# Patient Record
Sex: Male | Born: 1945
Health system: Southern US, Community
[De-identification: ages and names within clinical notes are randomized; demographics above are authoritative.]

## PROBLEM LIST (undated history)

## (undated) ENCOUNTER — Emergency Department (HOSPITAL_COMMUNITY): Discharge status: Against Medical Advice | Payer: Medicare Other

## (undated) DIAGNOSIS — Z9221 Personal history of antineoplastic chemotherapy: Secondary | ICD-10-CM

## (undated) DIAGNOSIS — C801 Malignant (primary) neoplasm, unspecified: Secondary | ICD-10-CM

## (undated) DIAGNOSIS — C679 Malignant neoplasm of bladder, unspecified: Secondary | ICD-10-CM

## (undated) DIAGNOSIS — H9190 Unspecified hearing loss, unspecified ear: Secondary | ICD-10-CM

## (undated) DIAGNOSIS — R634 Abnormal weight loss: Secondary | ICD-10-CM

## (undated) DIAGNOSIS — R5383 Other fatigue: Secondary | ICD-10-CM

## (undated) DIAGNOSIS — C189 Malignant neoplasm of colon, unspecified: Secondary | ICD-10-CM

## (undated) DIAGNOSIS — J449 Chronic obstructive pulmonary disease, unspecified: Secondary | ICD-10-CM

## (undated) DIAGNOSIS — Z85038 Personal history of other malignant neoplasm of large intestine: Secondary | ICD-10-CM

## (undated) DIAGNOSIS — N400 Enlarged prostate without lower urinary tract symptoms: Secondary | ICD-10-CM

## (undated) DIAGNOSIS — N200 Calculus of kidney: Secondary | ICD-10-CM

## (undated) DIAGNOSIS — C8591 Non-Hodgkin lymphoma, unspecified, lymph nodes of head, face, and neck: Principal | ICD-10-CM

## (undated) HISTORY — DX: Other fatigue: R53.83

## (undated) HISTORY — DX: Malignant neoplasm of bladder, unspecified: C67.9

## (undated) HISTORY — DX: Personal history of antineoplastic chemotherapy: Z92.21

## (undated) HISTORY — DX: Benign prostatic hyperplasia without lower urinary tract symptoms: N40.0

## (undated) HISTORY — DX: Malignant (primary) neoplasm, unspecified: C80.1

## (undated) HISTORY — PX: HAND TENDON SURGERY: SHX663

## (undated) HISTORY — DX: Non-Hodgkin lymphoma, unspecified, lymph nodes of head, face, and neck: C85.91

## (undated) HISTORY — DX: Malignant neoplasm of colon, unspecified: C18.9

## (undated) HISTORY — DX: Abnormal weight loss: R63.4

## (undated) HISTORY — DX: Personal history of other malignant neoplasm of large intestine: Z85.038

## (undated) HISTORY — DX: Calculus of kidney: N20.0

---

## 1981-04-09 HISTORY — PX: NASAL POLYP SURGERY: SHX186

## 2001-09-18 ENCOUNTER — Ambulatory Visit (HOSPITAL_COMMUNITY): Admission: RE | Admit: 2001-09-18 | Discharge: 2001-09-18 | Payer: Self-pay | Admitting: Orthopaedic Surgery

## 2003-08-04 ENCOUNTER — Ambulatory Visit (HOSPITAL_COMMUNITY): Admission: RE | Admit: 2003-08-04 | Discharge: 2003-08-04 | Payer: Self-pay | Admitting: *Deleted

## 2003-08-04 ENCOUNTER — Ambulatory Visit (HOSPITAL_BASED_OUTPATIENT_CLINIC_OR_DEPARTMENT_OTHER): Admission: RE | Admit: 2003-08-04 | Discharge: 2003-08-04 | Payer: Self-pay | Admitting: *Deleted

## 2008-04-09 DIAGNOSIS — C189 Malignant neoplasm of colon, unspecified: Secondary | ICD-10-CM

## 2008-04-09 DIAGNOSIS — C679 Malignant neoplasm of bladder, unspecified: Secondary | ICD-10-CM

## 2008-04-09 HISTORY — DX: Malignant neoplasm of colon, unspecified: C18.9

## 2008-04-09 HISTORY — DX: Malignant neoplasm of bladder, unspecified: C67.9

## 2008-04-09 HISTORY — PX: COLECTOMY: SHX59

## 2008-04-09 HISTORY — PX: BLADDER SURGERY: SHX569

## 2010-04-09 HISTORY — PX: COLONOSCOPY: SHX174

## 2012-03-26 ENCOUNTER — Other Ambulatory Visit: Payer: Self-pay | Admitting: Otolaryngology

## 2012-03-26 DIAGNOSIS — C801 Malignant (primary) neoplasm, unspecified: Secondary | ICD-10-CM

## 2012-03-26 HISTORY — DX: Malignant (primary) neoplasm, unspecified: C80.1

## 2012-03-26 HISTORY — PX: OTHER SURGICAL HISTORY: SHX169

## 2012-04-01 ENCOUNTER — Telehealth: Payer: Self-pay | Admitting: Oncology

## 2012-04-01 NOTE — Telephone Encounter (Signed)
LVOM for pt to return call.  °

## 2012-04-01 NOTE — Telephone Encounter (Signed)
S/W pt dtr in re NP appt 12/30 @ 2:45 w/Dr. Gaylyn Rong.  Referring Dr. Christia Reading Dx-NHL/LT TONSIL LYMPHOMA Welcome packet mailed.

## 2012-04-01 NOTE — Telephone Encounter (Signed)
C/D 04/01/12 for appt.04/07/12  °

## 2012-04-04 ENCOUNTER — Encounter: Payer: Self-pay | Admitting: Oncology

## 2012-04-04 DIAGNOSIS — C8591 Non-Hodgkin lymphoma, unspecified, lymph nodes of head, face, and neck: Secondary | ICD-10-CM | POA: Insufficient documentation

## 2012-04-04 DIAGNOSIS — R5383 Other fatigue: Secondary | ICD-10-CM | POA: Insufficient documentation

## 2012-04-06 NOTE — Patient Instructions (Addendum)
1.  Diagnosis:  High grade follicular lymphoma.  - Cause:  Unknown.   Lymphoma is not commonly known to be hereditary in most cases.  - Stage:  To be determined with further work up:  PET scan, bone marrow biopsy. - Prognosis:  To be determined with further work up. - Treatment:  Lymphoma is often not cured by surgery since it is often a systemic disease. Radiation if normally reserved for low grade, localized indolent lymphoma as upfront treatment or as consolidative therapy for bulky disease after chemotherapy.  - Chemotherapy:  R-CHOP chemo is given once every 3 weeks; with day #2 injection of Neulasta to decrease risk of infection.  This regimen contains:  Rituxan, Cytoxan, Adriamycin, Vincristine, Prednisone.  The first 4 agents are given intravenously on day 1 here at the [facility].  Prednisone pill is taken at home, in the morning, on days 1-5 (ONLY) of each chemo cycle.   - Potential side effects of R-CHOP which include but not limited to infusion reaction, rare seizure disorder, reactivated hepatitis, fatigue, low blood count, hair loss, mouth sore, infection, bleeding, irritated bladder lining with blood in urine, constipation, numbness and tingling of fingers and toes, secondary cancer, congestive heart failure.   - I also recommend intrathecal chemotherapy given slightly elevated risk of future intracranial lymphoma in patients with tonsil lymphoma.   - To assess response to chemo, a repeat scan will be obtained after a few cycles of chemo.   - In preparation for therapy:  * PET scan to complete staging work up.   * Bone marrow biopsy to rule out lymphoma in bone marrow space.   * Referral to 2-D echocardiogram to assess baseline heart function.   * Referral to Interventional Radiology for Portacath placement.   * Referral to chemo class to learn about practical tips while on chemo.   * Pick up nausea medications from your preferred pharmacy.

## 2012-04-07 ENCOUNTER — Other Ambulatory Visit (HOSPITAL_BASED_OUTPATIENT_CLINIC_OR_DEPARTMENT_OTHER): Payer: Medicare Other | Admitting: Lab

## 2012-04-07 ENCOUNTER — Ambulatory Visit (HOSPITAL_BASED_OUTPATIENT_CLINIC_OR_DEPARTMENT_OTHER): Payer: Medicare Other | Admitting: Oncology

## 2012-04-07 ENCOUNTER — Encounter: Payer: Self-pay | Admitting: Oncology

## 2012-04-07 ENCOUNTER — Telehealth: Payer: Self-pay | Admitting: Oncology

## 2012-04-07 VITALS — BP 142/90 | HR 76 | Temp 96.6°F | Resp 20 | Ht 71.0 in | Wt 152.1 lb

## 2012-04-07 DIAGNOSIS — C8591 Non-Hodgkin lymphoma, unspecified, lymph nodes of head, face, and neck: Secondary | ICD-10-CM

## 2012-04-07 DIAGNOSIS — Z85038 Personal history of other malignant neoplasm of large intestine: Secondary | ICD-10-CM

## 2012-04-07 DIAGNOSIS — R5383 Other fatigue: Secondary | ICD-10-CM

## 2012-04-07 DIAGNOSIS — Z8551 Personal history of malignant neoplasm of bladder: Secondary | ICD-10-CM

## 2012-04-07 DIAGNOSIS — C8291 Follicular lymphoma, unspecified, lymph nodes of head, face, and neck: Secondary | ICD-10-CM

## 2012-04-07 LAB — COMPREHENSIVE METABOLIC PANEL (CC13)
AST: 13 U/L (ref 5–34)
Albumin: 3.9 g/dL (ref 3.5–5.0)
Alkaline Phosphatase: 64 U/L (ref 40–150)
BUN: 11 mg/dL (ref 7.0–26.0)
Calcium: 9.3 mg/dL (ref 8.4–10.4)
Chloride: 105 mEq/L (ref 98–107)
Glucose: 86 mg/dl (ref 70–99)
Potassium: 4.3 mEq/L (ref 3.5–5.1)
Sodium: 141 mEq/L (ref 136–145)
Total Protein: 7.2 g/dL (ref 6.4–8.3)

## 2012-04-07 LAB — CBC WITH DIFFERENTIAL/PLATELET
BASO%: 0.5 % (ref 0.0–2.0)
Basophils Absolute: 0 10*3/uL (ref 0.0–0.1)
Eosinophils Absolute: 0.1 10*3/uL (ref 0.0–0.5)
HCT: 43.9 % (ref 38.4–49.9)
HGB: 15.6 g/dL (ref 13.0–17.1)
LYMPH%: 20.9 % (ref 14.0–49.0)
MCHC: 35.6 g/dL (ref 32.0–36.0)
MONO#: 0.4 10*3/uL (ref 0.1–0.9)
NEUT%: 71.5 % (ref 39.0–75.0)
Platelets: 270 10*3/uL (ref 140–400)
WBC: 7 10*3/uL (ref 4.0–10.3)

## 2012-04-07 LAB — URIC ACID (CC13): Uric Acid, Serum: 4.2 mg/dl (ref 2.6–7.4)

## 2012-04-07 MED ORDER — PREDNISONE 20 MG PO TABS: 40.0000 mg/m2 | ORAL_TABLET | Freq: Every day | ORAL | Status: DC

## 2012-04-07 MED ORDER — ONDANSETRON HCL 8 MG PO TABS: 8.0000 mg | ORAL_TABLET | Freq: Two times a day (BID) | ORAL | Status: DC

## 2012-04-07 MED ORDER — PROCHLORPERAZINE MALEATE 10 MG PO TABS: 10.0000 mg | ORAL_TABLET | Freq: Four times a day (QID) | ORAL | Status: DC | PRN

## 2012-04-07 MED ORDER — LIDOCAINE-PRILOCAINE 2.5-2.5 % EX CREA: TOPICAL_CREAM | CUTANEOUS | Status: DC | PRN

## 2012-04-07 NOTE — Progress Notes (Signed)
PCP checked.  Saw left tonsil white patch.     Decreased appetite.  Nausea.  Not able to eat well x a few weeks.  Only 2 lb lost. Dry heaves; worse in am. No Abd pain     [facility]  Telephone:(336) [phone] [phone]     INITIAL HEMATOLOGY CONSULTATION    Referral MD:  Kenneth Reed, M.D.  Reason for Referral: newly diagnosed high grade follicular lymphoma of right tonsil.   HPI: [name]. Kenneth Reed is a 66 year-old man with history of colon and bladder cancer and was cared for up in [facility].  I am requesting record from those episodes for continuity of care.  He was in [facility] until about a month ago when his PCP found a large left tonsil mass.  He was referred to Kenneth Reed who performed biopsy on 03/26/12 with pathology care # [id number] consistent with high grade non hodgkin B-cell lymphoma (CD20+; CD10+, BCL-6 positive; Ki-67 was 75%.  He was kindly referred to the [facility] for evaluation.    Kenneth Reed presented to the clinic today for the first time with his brother and sister.  He has not noticed any dysphagia, odynophagia, palpable nodes anywhere.  She does have decreased appetite and about less than 5-lb weight loss.  He has dry heaves in the morning but no frank vomiting.  He denied abdominal pain.  He does have slight fatigue; however, he is independent of all activities of daily living.   Patient denies fever, headache, visual changes, confusion, drenching night sweats, palpable lymph node swelling, mucositis, odynophagia, dysphagia, jaundice, chest pain, palpitation, shortness of breath, dyspnea on exertion, productive cough, gum bleeding, epistaxis, hematemesis, hemoptysis, abdominal pain, abdominal swelling, early satiety, melena, hematochezia, hematuria, skin rash, spontaneous bleeding, joint swelling, joint pain, heat or cold intolerance, bowel bladder incontinence, back pain, focal motor weakness, paresthesia, depression.       Past Medical History  Diagnosis Date  . Non-Hodgkin's lymphoma of head   . Fatigue   . Colon cancer 2010    Dr. Gabriel Reed operated; stage II.  No need for chemo.  . Bladder cancer 2010    Dr. Rito Reed operated; no need for chemo; recurred locally  in 2013 locally.    Marland Kitchen BPH (benign prostatic hyperplasia)   :    Past Surgical History  Procedure Date  . Nasal polyp surgery 1983  . Colectomy 2010  . Bladder surgery 2010  . Colonoscopy 2012    next one 2014.   :   CURRENT MEDS: Current Outpatient Prescriptions  Medication Sig Dispense Refill  . finasteride (PROSCAR) 5 MG tablet Take 5 mg by mouth daily.      . Fluticasone-Salmeterol (ADVAIR) 250-50 MCG/DOSE AEPB Inhale 1 puff into the lungs every 12 (twelve) hours.      Marland Kitchen omeprazole (PRILOSEC) 20 MG capsule Take 20 mg by mouth daily as needed.      . lidocaine-prilocaine (EMLA) cream Apply topically as needed. Apply to porta cath site one hour prior to needle stick  30 g  2  . ondansetron (ZOFRAN) 8 MG tablet Take 1 tablet (8 mg total) by mouth 2 (two) times daily. Take two times a day starting the day after chemo for 3 days. Then take two times a day as needed for nausea or vomiting.  30 tablet  1  . predniSONE (DELTASONE) 20 MG tablet Take 3.5 tablets (70 mg total) by mouth daily. Take on days 1-5 of  chemotherapy.  100 tablet  0  . prochlorperazine (COMPAZINE) 10 MG tablet Take 1 tablet (10 mg total) by mouth every 6 (six) hours as needed (Nausea or vomiting).  30 tablet  6      No Known Allergies:  Family History  Problem Relation Age of Onset  . Cancer Father     leukemia  . Cancer Maternal Aunt     leukemia  . Cancer Paternal Grandmother     leukemia  :  History   Social History  . Marital Status: Single    Spouse Name: N/A    Number of Children: 2  . Years of Education: N/A   Occupational History  .  [employer].    Social History Main Topics  . Smoking status: Never Smoker   .  Smokeless tobacco: Current User  . Alcohol Use: Yes  . Drug Use: No  . Sexually Active:    Other Topics Concern  . Not on file   Social History Narrative  . No narrative on file  :  REVIEW OF SYSTEM:  The rest of the 14-point review of sytem was negative.   Exam: ECOG 0-1.   General:  Thin-appearing man, in no acute distress.  Eyes:  no scleral icterus.  ENT:  There was a large fungating left tonsil mass about 3x2cm.  Neck was without thyromegaly.  Lymphatics:  Negative cervical, supraclavicular or axillary adenopathy.  Respiratory: lungs were clear bilaterally without wheezing or crackles.  Cardiovascular:  Regular rate and rhythm, S1/S2, without murmur, rub or gallop.  There was no pedal edema.  GI:  abdomen was soft, flat, nontender, nondistended, without organomegaly.  Muscoloskeletal:  no spinal tenderness of palpation of vertebral spine.  Skin exam was without echymosis, petichae.  Neuro exam was nonfocal.  Patient was able to get on and off exam table without assistance.  Gait was normal.  Patient was alerted and oriented.  Attention was good.   Language was appropriate.  Mood was normal without depression.  Speech was not pressured.  Thought content was not tangential.    LABS:  Lab Results  Component Value Date   WBC 6.0 04/08/2012   HGB 16.0 04/08/2012   HCT 44.3 04/08/2012   PLT 269 04/08/2012   GLUCOSE 86 04/07/2012   ALT 9 04/07/2012   AST 13 04/07/2012   NA 141 04/07/2012   K 4.3 04/07/2012   CL 105 04/07/2012   CREATININE 1.0 04/07/2012   BUN 11.0 04/07/2012   CO2 28 04/07/2012      ASSESSMENT AND PLAN:   1.  History of stage II colon cancer; s/p colectomy in 2010. He is supposedly in remission.  His last colonoscopy in 2012 was reportedly negative but is due for one more in 2014.  I am awaiting record from Dr. Gabriel Reed for confirmation.  2.  History of bladder cancer in 2010; with local recurrence in 2013.  I am awaiting record from Dr. Rito Reed for  confirmation.   3.  High grade follicular lymphoma.  - Cause:  Unknown.   Lymphoma is not commonly known to be hereditary in most cases.  - Stage:  To be determined with further work up:  PET scan, bone marrow biopsy. - Prognosis:  To be determined with further work up. - Treatment:  Lymphoma is often not cured by surgery since it is often a systemic disease. Radiation if normally reserved for low grade, localized indolent lymphoma as upfront treatment or as  consolidative therapy for bulky disease after chemotherapy.  - Chemotherapy:  R-CHOP chemo is given once every 3 weeks; with day #2 injection of Neulasta to decrease risk of infection.  This regimen contains:  Rituxan, Cytoxan, Adriamycin, Vincristine, Prednisone.  The first 4 agents are given intravenously on day 1 here at the [facility].  Prednisone pill is taken at home, in the morning, on days 1-5 (ONLY) of each chemo cycle.   - Potential side effects of R-CHOP which include but not limited to infusion reaction, rare seizure disorder, reactivated hepatitis, fatigue, low blood count, hair loss, mouth sore, infection, bleeding, irritated bladder lining with blood in urine, constipation, numbness and tingling of fingers and toes, secondary cancer, congestive heart failure.   - I also recommend intrathecal chemotherapy given slightly elevated risk of future intracranial lymphoma in patients with tonsil lymphoma.   - To assess response to chemo, a repeat scan will be obtained after a few cycles of chemo.   - In preparation for therapy:  * PET scan to complete staging work up.   * Bone marrow biopsy to rule out lymphoma in bone marrow space.   * Referral to 2-D echocardiogram to assess baseline heart function.   * Referral to Interventional Radiology for Portacath placement.   * Referral to chemo class to learn about practical tips while on chemo.   * Pick up nausea medications from your preferred pharmacy.   Mr. Patti and his relatives  expressed informed understanding of all these recommendations and agreed to proceed.        The length of time of the face-to-face encounter was 60 minutes. More than 50% of time was spent counseling and coordination of care.     Thank you for this referral.

## 2012-04-07 NOTE — Telephone Encounter (Signed)
Pt given appt for bmbx tomorrow @ 9:45am. Pt aware he is to arrive @ 9:15am. Per HH he can do 9:45am due to 11am not available. S/w Myriam Jacobson ok to add bmbx for 9:45am 12/31 and Cameo will have to help - [facility] aware. Pt aware he can get other appts when he comes in tomorrow.

## 2012-04-08 ENCOUNTER — Encounter: Payer: Self-pay | Admitting: *Deleted

## 2012-04-08 ENCOUNTER — Other Ambulatory Visit: Payer: Self-pay | Admitting: Oncology

## 2012-04-08 ENCOUNTER — Encounter: Payer: Self-pay | Admitting: Oncology

## 2012-04-08 ENCOUNTER — Other Ambulatory Visit (HOSPITAL_COMMUNITY)
Admission: RE | Admit: 2012-04-08 | Discharge: 2012-04-08 | Disposition: A | Discharge status: Home / Self Care | Payer: Medicare Other | Source: Ambulatory Visit | Attending: Oncology | Admitting: Oncology

## 2012-04-08 ENCOUNTER — Telehealth: Payer: Self-pay | Admitting: *Deleted

## 2012-04-08 ENCOUNTER — Ambulatory Visit (HOSPITAL_BASED_OUTPATIENT_CLINIC_OR_DEPARTMENT_OTHER): Payer: Medicare Other | Admitting: Oncology

## 2012-04-08 ENCOUNTER — Ambulatory Visit (HOSPITAL_BASED_OUTPATIENT_CLINIC_OR_DEPARTMENT_OTHER): Payer: Medicare Other | Admitting: Lab

## 2012-04-08 ENCOUNTER — Telehealth: Payer: Self-pay | Admitting: Oncology

## 2012-04-08 VITALS — BP 147/82 | HR 65

## 2012-04-08 DIAGNOSIS — C8591 Non-Hodgkin lymphoma, unspecified, lymph nodes of head, face, and neck: Secondary | ICD-10-CM

## 2012-04-08 DIAGNOSIS — C8291 Follicular lymphoma, unspecified, lymph nodes of head, face, and neck: Secondary | ICD-10-CM

## 2012-04-08 DIAGNOSIS — C8589 Other specified types of non-Hodgkin lymphoma, extranodal and solid organ sites: Secondary | ICD-10-CM | POA: Insufficient documentation

## 2012-04-08 LAB — CBC WITH DIFFERENTIAL/PLATELET
BASO%: 1.1 % (ref 0.0–2.0)
EOS%: 2.6 % (ref 0.0–7.0)
HCT: 44.3 % (ref 38.4–49.9)
MCV: 91.8 fL (ref 79.3–98.0)
NEUT#: 4 10*3/uL (ref 1.5–6.5)
Platelets: 269 10*3/uL (ref 140–400)
RBC: 4.82 10*6/uL (ref 4.20–5.82)

## 2012-04-08 NOTE — Telephone Encounter (Signed)
gv pt's son appt schedule for January thru April including echo appt and port placement appt that was already on schedule. Per son he has the port info but has to call IR back and has the number. Son aware central will contact them re pet scan appt.

## 2012-04-08 NOTE — Progress Notes (Signed)
Post bone marrow procedure.

## 2012-04-08 NOTE — Progress Notes (Signed)
Pt tolerated bone marrow bx well.  Pt instructed to call for any excessive bleeding not stopped by pressure or pain not relieved by tylenol.  Pt instructed to leave dressing on for 24 hours after procedure.  Pt to monitor for bleeding.  Site/dressing clean, dry, and intact at discharge.  Pt verbalized understanding.

## 2012-04-08 NOTE — Progress Notes (Signed)
See bone marrow biopsy procedure note dated 04/08/12. 

## 2012-04-08 NOTE — Patient Instructions (Addendum)
Bone Marrow Aspiration, Bone Marrow Biopsy  Care After  Read the instructions outlined below and refer to this sheet in the next few weeks. These discharge instructions provide you with general information on caring for yourself after you leave the hospital. Your caregiver may also give you specific instructions. While your treatment has been planned according to the most current medical practices available, unavoidable complications occasionally occur. If you have any problems or questions after discharge, call your caregiver.  FINDING OUT THE RESULTS OF YOUR TEST  Not all test results are available during your visit. If your test results are not back during the visit, make an appointment with your caregiver to find out the results. Do not assume everything is normal if you have not heard from your caregiver or the medical facility. It is important for you to follow up on all of your test results.   HOME CARE INSTRUCTIONS   You have had sedation and may be sleepy or dizzy. Your thinking may not be as clear as usual. For the next 24 hours:   Only take over-the-counter or prescription medicines for pain, discomfort, and or fever as directed by your caregiver.   Do not drink alcohol.   Do not smoke.   Do not drive.   Do not make important legal decisions.   Do not operate heavy machinery.   Do not care for small children by yourself.   Keep your dressing clean and dry. You may replace dressing with a bandage after 24 hours.   You may take a bath or shower after 24 hours.   Use an ice pack for 20 minutes every 2 hours while awake for pain as needed.  SEEK MEDICAL CARE IF:    There is redness, swelling, or increasing pain at the biopsy site.   There is pus coming from the biopsy site.   There is drainage from a biopsy site lasting longer than one day.   An unexplained oral temperature above 102 F (38.9 C) develops.  SEEK IMMEDIATE MEDICAL CARE IF:    You develop a rash.   You have difficulty  breathing.   You develop any reaction or side effects to medications given.  Document Released: 10/13/2004 Document Revised: 06/18/2011 Document Reviewed: 03/23/2008  ExitCare Patient Information 2013 [employer].

## 2012-04-08 NOTE — Progress Notes (Signed)
Faxed signed release of information to Dr. Gabriel Cirri office [phone] per Dr. Gaylyn Rong request for Pathology report and Operative notes.

## 2012-04-08 NOTE — Telephone Encounter (Signed)
Per staff phone call and POF I have scheduled appt. JMW  

## 2012-04-08 NOTE — Procedures (Signed)
   [facility]  Telephone:(336) [phone] Fax:(336) [phone]   BONE MARROW BIOPSY AND ASPIRATION   INDICATION:  Staging for non Hodgkin's lymphoma.   Procedure: After obtained from consent, DANNA CASELLA was placed in the prone position. Time out was performed verifying correct patient and procedure. The skin overlying the left posterior crest was prepped with Betadine and draped in the usual sterile fashion. The skin and periosteum were infiltrated with 10  mL of 2% lidocaine. A small puncture wound was made with #11 scalpel blade.  Bone marrow aspirate was obtained on the first pass of the aspiration needle.  One core biopsy was obtained through the same incision.   The aspirate was sent for routine histology, flow cytometry, and cytogenetics.  Core biopsy was sent for routine histology.   Beulah Gandy Cafiero tolerated procedure well with minimal  blood loss and without immediate complication.   A sterile dressing was applied.   Jethro Bolus M.D. 04/08/2012

## 2012-04-09 DIAGNOSIS — C8591 Non-Hodgkin lymphoma, unspecified, lymph nodes of head, face, and neck: Secondary | ICD-10-CM

## 2012-04-09 HISTORY — DX: Non-Hodgkin lymphoma, unspecified, lymph nodes of head, face, and neck: C85.91

## 2012-04-10 ENCOUNTER — Other Ambulatory Visit: Payer: Self-pay | Admitting: Radiology

## 2012-04-11 ENCOUNTER — Encounter (HOSPITAL_COMMUNITY)
Admission: RE | Admit: 2012-04-11 | Discharge: 2012-04-11 | Disposition: A | Discharge status: Home / Self Care | Payer: Medicare Other | Source: Ambulatory Visit | Attending: Oncology | Admitting: Oncology

## 2012-04-11 ENCOUNTER — Telehealth: Payer: Self-pay | Admitting: Oncology

## 2012-04-11 ENCOUNTER — Encounter (HOSPITAL_COMMUNITY): Payer: Self-pay | Admitting: Pharmacy Technician

## 2012-04-11 DIAGNOSIS — R5381 Other malaise: Secondary | ICD-10-CM | POA: Insufficient documentation

## 2012-04-11 DIAGNOSIS — C8581 Other specified types of non-Hodgkin lymphoma, lymph nodes of head, face, and neck: Secondary | ICD-10-CM | POA: Insufficient documentation

## 2012-04-11 DIAGNOSIS — R5383 Other fatigue: Secondary | ICD-10-CM

## 2012-04-11 DIAGNOSIS — C8591 Non-Hodgkin lymphoma, unspecified, lymph nodes of head, face, and neck: Secondary | ICD-10-CM

## 2012-04-11 LAB — GLUCOSE, CAPILLARY: Glucose-Capillary: 93 mg/dL (ref 70–99)

## 2012-04-11 MED ORDER — FLUDEOXYGLUCOSE F - 18 (FDG) INJECTION
17.3000 | Freq: Once | INTRAVENOUS | Status: AC | PRN
  Administered 2012-04-11: 17.3 via INTRAVENOUS

## 2012-04-11 NOTE — Telephone Encounter (Signed)
Pt came in and needed to move appt to another day bc pt had appt for everyday...Kenneth KitchenMarland Reed

## 2012-04-14 ENCOUNTER — Other Ambulatory Visit: Payer: Medicare Other

## 2012-04-15 ENCOUNTER — Encounter (HOSPITAL_COMMUNITY): Payer: Self-pay

## 2012-04-15 ENCOUNTER — Ambulatory Visit (HOSPITAL_COMMUNITY)
Admission: RE | Admit: 2012-04-15 | Discharge: 2012-04-15 | Disposition: A | Discharge status: Home / Self Care | Payer: Medicare Other | Source: Ambulatory Visit | Attending: Oncology | Admitting: Oncology

## 2012-04-15 ENCOUNTER — Other Ambulatory Visit: Payer: Self-pay | Admitting: Oncology

## 2012-04-15 ENCOUNTER — Ambulatory Visit (HOSPITAL_COMMUNITY): Admission: RE | Admit: 2012-04-15 | Payer: Medicare Other | Source: Ambulatory Visit

## 2012-04-15 ENCOUNTER — Other Ambulatory Visit (INDEPENDENT_AMBULATORY_CARE_PROVIDER_SITE_OTHER): Payer: Self-pay | Admitting: Surgery

## 2012-04-15 DIAGNOSIS — C8589 Other specified types of non-Hodgkin lymphoma, extranodal and solid organ sites: Secondary | ICD-10-CM | POA: Insufficient documentation

## 2012-04-15 DIAGNOSIS — R5383 Other fatigue: Secondary | ICD-10-CM

## 2012-04-15 DIAGNOSIS — Z79899 Other long term (current) drug therapy: Secondary | ICD-10-CM | POA: Insufficient documentation

## 2012-04-15 DIAGNOSIS — Z8551 Personal history of malignant neoplasm of bladder: Secondary | ICD-10-CM | POA: Insufficient documentation

## 2012-04-15 DIAGNOSIS — C8591 Non-Hodgkin lymphoma, unspecified, lymph nodes of head, face, and neck: Secondary | ICD-10-CM

## 2012-04-15 DIAGNOSIS — Z85038 Personal history of other malignant neoplasm of large intestine: Secondary | ICD-10-CM | POA: Insufficient documentation

## 2012-04-15 DIAGNOSIS — Z806 Family history of leukemia: Secondary | ICD-10-CM | POA: Insufficient documentation

## 2012-04-15 LAB — CBC WITH DIFFERENTIAL/PLATELET
Basophils Absolute: 0 10*3/uL (ref 0.0–0.1)
Basophils Relative: 1 % (ref 0–1)
Eosinophils Relative: 2 % (ref 0–5)
HCT: 42.4 % (ref 39.0–52.0)
MCH: 32.1 pg (ref 26.0–34.0)
MCHC: 36.1 g/dL — ABNORMAL HIGH (ref 30.0–36.0)
MCV: 88.9 fL (ref 78.0–100.0)
Monocytes Absolute: 0.4 10*3/uL (ref 0.1–1.0)
RBC: 4.77 MIL/uL (ref 4.22–5.81)
RDW: 12.2 % (ref 11.5–15.5)
WBC: 7.7 10*3/uL (ref 4.0–10.5)

## 2012-04-15 LAB — APTT: aPTT: 33 seconds (ref 24–37)

## 2012-04-15 MED ORDER — FENTANYL CITRATE 0.05 MG/ML IJ SOLN
INTRAMUSCULAR | Status: AC
  Filled 2012-04-15: qty 4

## 2012-04-15 MED ORDER — MIDAZOLAM HCL 2 MG/2ML IJ SOLN
INTRAMUSCULAR | Status: AC
  Filled 2012-04-15: qty 4

## 2012-04-15 MED ORDER — SODIUM CHLORIDE 0.9 % IV SOLN: INTRAVENOUS | Status: DC

## 2012-04-15 MED ORDER — MIDAZOLAM HCL 2 MG/2ML IJ SOLN
INTRAMUSCULAR | Status: AC | PRN
  Administered 2012-04-15: 2 mg via INTRAVENOUS

## 2012-04-15 MED ORDER — CEFAZOLIN SODIUM 1-5 GM-% IV SOLN
1.0000 g | Freq: Once | INTRAVENOUS | Status: AC
  Administered 2012-04-15: 1 g via INTRAVENOUS
  Filled 2012-04-15: qty 50

## 2012-04-15 MED ORDER — HEPARIN SOD (PORK) LOCK FLUSH 100 UNIT/ML IV SOLN: 500.0000 [IU] | Freq: Once | INTRAVENOUS | Status: DC

## 2012-04-15 MED ORDER — FENTANYL CITRATE 0.05 MG/ML IJ SOLN
INTRAMUSCULAR | Status: AC | PRN
  Administered 2012-04-15: 100 ug via INTRAVENOUS

## 2012-04-15 NOTE — H&P (Signed)
Chief Complaint: "I'm here for a port" Referring Physician:Ha HPI: Kenneth Reed is an 67 y.o. male who is found to have tonsillar lymphoma. He is referred for port placement to receive chemotherapy. PMHx and meds reviewed.  Past Medical History:  Past Medical History  Diagnosis Date  . Non-Hodgkin's lymphoma of head   . Fatigue   . Colon cancer 2010    Dr. Gabriel Cirri operated; stage II.  No need for chemo.  . Bladder cancer 2010    Dr. Rito Ehrlich operated; no need for chemo; recurred locally  in 2013 locally.    Marland Kitchen BPH (benign prostatic hyperplasia)     Past Surgical History:  Past Surgical History  Procedure Date  . Nasal polyp surgery 1983  . Colectomy 2010  . Bladder surgery 2010  . Colonoscopy 2012    next one 2014.     Family History:  Family History  Problem Relation Age of Onset  . Cancer Father     leukemia  . Cancer Maternal Aunt     leukemia  . Cancer Paternal Grandmother     leukemia    Social History:  reports that he has never smoked. He uses smokeless tobacco. He reports that he drinks alcohol. He reports that he does not use illicit drugs.  Allergies: No Known Allergies  Medications: albuterol (PROVENTIL HFA;VENTOLIN HFA) 108 (90 BASE) MCG/ACT inhaler (Taking) Sig - Route: Inhale 2 puffs into the lungs every 4 (four) hours as needed. For shortness of breath. - Inhalation Class: Historical Med finasteride (PROSCAR) 5 MG tablet (Taking) Sig - Route: Take 5 mg by mouth at bedtime. - Oral Class: Historical Med Number of times this order has been changed since signing: 2 Order [address] Fluticasone-Salmeterol (ADVAIR) 250-50 MCG/DOSE AEPB (Taking) Sig - Route: Inhale 1 puff into the lungs every 12 (twelve) hours. - Inhalation Class: Historical Med Number of times this order has been changed since signing: 1 Order [address] lidocaine-prilocaine (EMLA) cream (Taking) 04/07/2012 Sig - Route: Apply 1 application topically daily as needed. Applies to port-a-cath site  one hour prior to needle stick. - Topical Class: Historical Med ondansetron (ZOFRAN) 8 MG tablet (Taking) 04/07/2012 Sig - Route: Take 8 mg by mouth 2 (two) times daily. He is to take two times a day starting the day after chemo for 3 days. Then take two times a day as needed for nausea or vomiting. - Oral Class: Historical Med predniSONE (DELTASONE) 20 MG tablet (Taking) 04/07/2012 Sig - Route: Take 40 mg/m2 by mouth daily. He is to take on days 1-5 of chemotherapy. - Oral Class: Historical Med prochlorperazine (COMPAZINE) 10 MG tablet (Taking) 04/07/2012 Sig - Route: Take 10 mg by mouth every 6 (six) hours as needed. For nausea. - Oral Class: Historical Med   Please HPI for pertinent positives, otherwise complete 10 system ROS negative.  Physical Exam: Blood pressure 130/78, pulse 81, temperature 97.8 F (36.6 C), resp. rate 24, SpO2 97.00%. There is no height or weight on file to calculate BMI.   General Appearance:  Alert, cooperative, no distress, appears stated age  Head:  Normocephalic, without obvious abnormality, atraumatic  ENT: Unremarkable  Neck: Supple, symmetrical, trachea midline, no adenopathy, thyroid: not enlarged, symmetric, no tenderness/mass/nodules  Lungs:   Clear to auscultation bilaterally, no w/r/r, respirations unlabored without use of accessory muscles.  Chest Wall:  No tenderness or deformity  Heart:  Regular rate and rhythm, S1, S2 normal, no murmur, rub or gallop. Carotids 2+ without bruit.  Neurologic: Normal affect, no gross deficits.   No results found for this or any previous visit (from the past 48 hour(s)). No results found.  Assessment/Plan Lymphoma of (L)tonsil Discussed port placement procedure, including risks and complications. Labs pending Consent signed in chart  Brayton El PA-C 04/15/2012, 12:42 PM

## 2012-04-15 NOTE — Procedures (Signed)
Successful placement of right IJ approach port-a-cath with tip at the superior caval atrial junction. The catheter is ready for immediate use. No immediate post procedural complications. 

## 2012-04-16 ENCOUNTER — Other Ambulatory Visit: Payer: Self-pay | Admitting: Certified Registered Nurse Anesthetist

## 2012-04-16 ENCOUNTER — Ambulatory Visit (HOSPITAL_COMMUNITY)
Admission: RE | Admit: 2012-04-16 | Discharge: 2012-04-16 | Disposition: A | Discharge status: Home / Self Care | Payer: Medicare Other | Source: Ambulatory Visit | Attending: Oncology | Admitting: Oncology

## 2012-04-16 ENCOUNTER — Other Ambulatory Visit: Payer: Self-pay

## 2012-04-16 DIAGNOSIS — C8591 Non-Hodgkin lymphoma, unspecified, lymph nodes of head, face, and neck: Secondary | ICD-10-CM

## 2012-04-16 DIAGNOSIS — R5383 Other fatigue: Secondary | ICD-10-CM

## 2012-04-16 DIAGNOSIS — I517 Cardiomegaly: Secondary | ICD-10-CM

## 2012-04-16 NOTE — Progress Notes (Signed)
  Echocardiogram 2D Echocardiogram has been performed.  Cathie Beams 04/16/2012, 10:53 AM

## 2012-04-17 ENCOUNTER — Encounter: Payer: Self-pay | Admitting: *Deleted

## 2012-04-17 ENCOUNTER — Other Ambulatory Visit: Payer: Medicare Other

## 2012-04-17 ENCOUNTER — Telehealth: Payer: Self-pay | Admitting: Oncology

## 2012-04-17 ENCOUNTER — Ambulatory Visit (HOSPITAL_BASED_OUTPATIENT_CLINIC_OR_DEPARTMENT_OTHER): Payer: Medicare Other | Admitting: Oncology

## 2012-04-17 VITALS — BP 116/65 | HR 86 | Temp 97.5°F | Resp 18 | Ht 71.0 in | Wt 151.0 lb

## 2012-04-17 DIAGNOSIS — C189 Malignant neoplasm of colon, unspecified: Secondary | ICD-10-CM

## 2012-04-17 DIAGNOSIS — C8291 Follicular lymphoma, unspecified, lymph nodes of head, face, and neck: Secondary | ICD-10-CM

## 2012-04-17 DIAGNOSIS — C8591 Non-Hodgkin lymphoma, unspecified, lymph nodes of head, face, and neck: Secondary | ICD-10-CM

## 2012-04-17 DIAGNOSIS — C679 Malignant neoplasm of bladder, unspecified: Secondary | ICD-10-CM

## 2012-04-17 NOTE — Telephone Encounter (Signed)
Gave pt appt for January 2014 lab, ML and chemo °

## 2012-04-17 NOTE — Telephone Encounter (Signed)
Gave pt appt for January 2014 lab and ML then chemo on 05/09/12

## 2012-04-17 NOTE — Patient Instructions (Addendum)
1.  Diagnosis:   high grade follicular lymphoma of right tonsil.   - Stage: I.  PET scan did not show disease elsewhere.  Bone marrow biopsy did not show lymphoma. - Prognosis:  Chance of cure is about 80-90%.  - Treatment:  4 cycles of chemo R-CHOP.  Intrathecal chemo with cycles 2,3, and 4 to decrease risk of lymphoma in the brain in the future.  Radiation after chemo is finished to decreased risk of recurrence.  - Chemotherapy:  R-CHOP chemo is given once every 3 weeks; with day #2 injection of Neulasta to decrease risk of infection.  This regimen contains:  Rituxan, Cytoxan, Adriamycin, Vincristine, Prednisone.  The first 4 agents are given intravenously on day 1 here at the [facility].  Prednisone pill is taken at home, in the morning, on days 1-5 (ONLY) of each chemo cycle.   - Potential side effects of R-CHOP which include but not limited to infusion reaction, rare seizure disorder, reactivated hepatitis, fatigue, low blood count, hair loss, mouth sore, infection, bleeding, irritated bladder lining with blood in urine, constipation, numbness and tingling of fingers and toes, secondary cancer, congestive heart failure.   - To assess response to chemo, a repeat scan will be obtained after a few cycles of chemo.   - In preparation for therapy:  * PET scan to complete staging work up:  Done.  No lymphoma elsewhere.   * Bone marrow biopsy to rule out lymphoma in bone marrow space.   Done.  No lymphoma.   * Referral to 2-D echocardiogram to assess baseline heart function.   Done.   Good cardiac function.   * Referral to Interventional Radiology for Portacath placement.  Done.   * Referral to chemo class to learn about practical tips while on chemo.   Done.      2.  If you experience nausea and vomiting with chemotherapy, please take your prescribed medications.  These medications should NOT be taken at the same times.  Take one by itself, and if the first one does not work in about 2-3 hours,  take a different medications.  In other words, they should be rotated.    *  Prochloroperzaine (Brand name:  Compazine):  10mg  by mouth, every 6 hours as needed.   *  Odansetron (Brand name:  Zofran):  4-8 mg by mouth or under the tongue, every 8-12 hours as needed.   If these two do not work, please contact us to ensure that they are taken in the most effective way and also to see if you need additional nausea meds.

## 2012-04-17 NOTE — Progress Notes (Signed)
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   PAST THERAPY:  Biopsy only.   CURRENT THERAPY:  Due to start chemo R-CHOP today.  Goal is for 4 cycles with.  Plan is also to start intrathecal chemo with cycles 2-4.  Plan is also for consolidative radiation after finish of chemo.   INTERVAL HISTORY: Kenneth Reed 67 y.o. male returns for regular follow up to go over result of work up before starting chemo.  He reported feeling well.  He sometime feels food stuck in the left tonsil.   Patient denies fever, anorexia, weight loss, fatigue, headache, visual changes, confusion, drenching night sweats, palpable lymph node swelling, mucositis, odynophagia, dysphagia, nausea vomiting, jaundice, chest pain, palpitation, shortness of breath, dyspnea on exertion, productive cough, gum bleeding, epistaxis, hematemesis, hemoptysis, abdominal pain, abdominal swelling, early satiety, melena, hematochezia, hematuria, skin rash, spontaneous bleeding, joint swelling, joint pain, heat or cold intolerance, bowel bladder incontinence, back pain, focal motor weakness, paresthesia, depression, suicidal or homicidal ideation, feeling hopelessness.   Past Medical History  Diagnosis Date  . Non-Hodgkin's lymphoma of head   . Fatigue   . Colon cancer 2010    Dr. Gabriel Cirri operated; stage II.  No need for chemo.  . Bladder cancer 2010    Dr. Rito Ehrlich operated; no need for chemo; recurred locally  in 2013 locally.    Marland Kitchen BPH (benign prostatic hyperplasia)     Past Surgical History  Procedure Date  . Nasal polyp surgery 1983  . Colectomy 2010  . Bladder surgery 2010  . Colonoscopy 2012    next one 2014.     Current Outpatient Prescriptions  Medication Sig Dispense Refill  . albuterol (PROVENTIL HFA;VENTOLIN HFA) 108 (90 BASE) MCG/ACT  inhaler Inhale 2 puffs into the lungs every 4 (four) hours as needed. For shortness of breath.      . finasteride (PROSCAR) 5 MG tablet Take 5 mg by mouth at bedtime.       . Fluticasone-Salmeterol (ADVAIR) 250-50 MCG/DOSE AEPB Inhale 1 puff into the lungs every 12 (twelve) hours.      . lidocaine-prilocaine (EMLA) cream Apply 1 application topically daily as needed. Applies to port-a-cath site one hour prior to needle stick.      Marland Kitchen ondansetron (ZOFRAN) 8 MG tablet Take 8 mg by mouth 2 (two) times daily. He is to take two times a day starting the day after chemo for 3 days. Then take two times a day as needed for nausea or vomiting.      . predniSONE (DELTASONE) 20 MG tablet Take 40 mg/m2 by mouth daily. He is to take on days 1-5 of chemotherapy.      . prochlorperazine (COMPAZINE) 10 MG tablet Take 10 mg by mouth every 6 (six) hours as needed. For nausea.        ALLERGIES:   has no known allergies.  REVIEW OF SYSTEMS:  The rest of the 14-point review of system was negative.   Filed Vitals:   04/17/12 1028  BP: 116/65  Pulse: 86  Temp: 97.5 F (36.4 C)  Resp: 18   Wt Readings from Last 3 Encounters:  04/17/12 151 lb (68.493 kg)  04/07/12 152 lb 1.6 oz (68.992 kg)   ECOG Performance status: 0  PHYSICAL EXAMINATION:   General:  Thin-appearing man,  in no acute distress.  Eyes:  no scleral icterus.  ENT:  There was a large fungating mass in the left tonsil.  Neck was without thyromegaly.  Lymphatics:  Negative cervical, supraclavicular or axillary adenopathy.  Respiratory: lungs were clear bilaterally without wheezing or crackles.  Cardiovascular:  Regular rate and rhythm, S1/S2, without murmur, rub or gallop.  There was no pedal edema.  GI:  abdomen was soft, flat, nontender, nondistended, without organomegaly.  Muscoloskeletal:  no spinal tenderness of palpation of vertebral spine.  Skin exam was without echymosis, petichae.  Neuro exam was nonfocal.  Patient was able to get on and off  exam table without assistance.  Gait was normal.  Patient was alerted and oriented.  Attention was good.   Language was appropriate.  Mood was normal without depression.  Speech was not pressured.  Thought content was not tangential.      LABORATORY/RADIOLOGY DATA:  Lab Results  Component Value Date   WBC 7.7 04/15/2012   HGB 15.3 04/15/2012   HCT 42.4 04/15/2012   PLT 290 04/15/2012   GLUCOSE 86 04/07/2012   ALKPHOS 64 04/07/2012   ALT 9 04/07/2012   AST 13 04/07/2012   NA 141 04/07/2012   K 4.3 04/07/2012   CL 105 04/07/2012   CREATININE 1.0 04/07/2012   BUN 11.0 04/07/2012   CO2 28 04/07/2012   INR 0.97 04/15/2012    Nm Pet Image Initial (pi) Skull Base To Thigh  04/11/2012  *RADIOLOGY REPORT*  Clinical Data: Initial treatment strategy for tonsillar non- Hodgkins lymphoma.  NUCLEAR MEDICINE PET SKULL BASE TO THIGH  Fasting Blood Glucose:  93  Technique:  17.3 mCi F-18 FDG was injected intravenously. CT data was obtained and used for attenuation correction and anatomic localization only.  (This was not acquired as a diagnostic CT examination.) Additional exam technical data entered on technologist worksheet. Dedicated PET imaging of the neck region was also performed.  Any standard uptake value measurements in the neck region are taken from these dedicated neck images.  Comparison:  None  Findings:  Neck: A mass of the left tonsillar pillar has a maximum standard uptake value of 19.6.  A small low-density suboccipital lesion in the subcutaneous tissues has a maximum standard uptake value of 9.3 as compared to contralateral soft tissues measuring 7.6.  Chronic ethmoid, maxillary, and sphenoid sinusitis noted with nasal polyps and prior partial ethmoidectomy.  Chest:  No hypermetabolic mediastinal or hilar nodes.  No suspicious pulmonary nodules on the CT scan.  Scattered calcified granulomas and calcified hilar and infrahilar lymph nodes are observed.  Abdomen/Pelvis:  No abnormal hypermetabolic  activity within the liver, pancreas, adrenal glands, or spleen.  No hypermetabolic lymph nodes in the abdomen or pelvis.  Postoperative findings include sigmoid colon anastomosis from prior partial colectomy. Nondistended urinary bladder noted.  Skeleton:  No focal hypermetabolic activity to suggest skeletal metastasis. Bilateral pars defects noted at L5 with grade 1 anterolisthesis.  Benign appearing sclerotic lesion in the left iliac crest is unchanged in 2010.  IMPRESSION:  1.  Hypermetabolic mass in the left tonsillar pillars, with maximum standard uptake value 19.6. 2.  A low density suboccipital subcutaneous lesion measuring 1.7 x 1.0 cm has faint peripheral calcification and mildly elevated standard uptake value, and may represent an inflammatory subcutaneous lesion.  Involvement by non-Hodgkins lymphoma is a less likely differential diagnostic consideration given the appearance. 3.  Prominent chronic paranasal sinusitis with nasal polyps and prior partial ethmoidectomy. 4.  Old granulomatous  disease in the chest. 5.  Pars defects at L5 with grade 1 anterolisthesis.        ASSESSMENT AND PLAN:   1. History of stage II colon cancer; s/p colectomy in 2010. He is supposedly in remission. His last colonoscopy in 2012 was reportedly negative but is due for one more in 2014.  2. History of bladder cancer in 2010; with local recurrence in 2013.    3. High grade follicular lymphoma of right tonsil; IPI of 1 (or low risk)  - Stage: I.  PET scan did not show disease elsewhere.  Bone marrow biopsy did not show lymphoma. - Prognosis:  Chance of cure is about 80-90%.  - Treatment:  4 cycles of chemo R-CHOP.  Intrathecal chemo methotrexate with cycles 2,3, and 4 to decrease risk of lymphoma in the brain in the future. I wanted to skip intrathecal chemo with the fist cycle of R-CHOP to ensure that he tolerates R-CHOP well.   Radiation after chemo is finished to decrease risk of recurrence.  - Chemotherapy:   R-CHOP chemo is given once every 3 weeks; with day #2 injection of Neulasta to decrease risk of infection.  This regimen contains:  Rituxan, Cytoxan, Adriamycin, Vincristine, Prednisone.  The first 4 agents are given intravenously on day 1 here at the [facility].  Prednisone pill is taken at home, in the morning, on days 1-5 (ONLY) of each chemo cycle.   - Potential side effects of R-CHOP which include but not limited to infusion reaction, rare seizure disorder, reactivated hepatitis, fatigue, low blood count, hair loss, mouth sore, infection, bleeding, irritated bladder lining with blood in urine, constipation, numbness and tingling of fingers and toes, secondary cancer, congestive heart failure.   - Kenneth Reed expressed informed understanding and wished to proceed as recommended.   - To assess response to chemo, a repeat scan will be obtained after 4 cycles of chemo.  We can assess clinical response with clinical exam during chemo.    - In preparation for therapy:  * PET scan to complete staging work up:  Done.  No lymphoma elsewhere.   * Bone marrow biopsy to rule out lymphoma in bone marrow space.   Done.  No lymphoma.   * Referral to 2-D echocardiogram to assess baseline heart function.   Done.   Good cardiac function.   * Referral to Interventional Radiology for Portacath placement.  Done.   * Referral to chemo class to learn about practical tips while on chemo.   Done.      2.  Nausea/vomiting:  He has Compazine/Zofran prn.      The length of time of the face-to-face encounter was 25 minutes. More than 50% of time was spent counseling and coordination of care.

## 2012-04-18 ENCOUNTER — Ambulatory Visit (HOSPITAL_BASED_OUTPATIENT_CLINIC_OR_DEPARTMENT_OTHER): Payer: Medicare Other

## 2012-04-18 VITALS — BP 103/63 | HR 91 | Temp 98.0°F | Resp 20

## 2012-04-18 DIAGNOSIS — C8291 Follicular lymphoma, unspecified, lymph nodes of head, face, and neck: Secondary | ICD-10-CM

## 2012-04-18 DIAGNOSIS — Z5111 Encounter for antineoplastic chemotherapy: Secondary | ICD-10-CM

## 2012-04-18 DIAGNOSIS — C8591 Non-Hodgkin lymphoma, unspecified, lymph nodes of head, face, and neck: Secondary | ICD-10-CM

## 2012-04-18 DIAGNOSIS — Z5112 Encounter for antineoplastic immunotherapy: Secondary | ICD-10-CM

## 2012-04-18 MED ORDER — HEPARIN SOD (PORK) LOCK FLUSH 100 UNIT/ML IV SOLN
500.0000 [IU] | Freq: Once | INTRAVENOUS | Status: AC | PRN
  Administered 2012-04-18: 500 [IU]
  Filled 2012-04-18: qty 5

## 2012-04-18 MED ORDER — SODIUM CHLORIDE 0.9 % IV SOLN
750.0000 mg/m2 | Freq: Once | INTRAVENOUS | Status: AC
  Administered 2012-04-18: 1400 mg via INTRAVENOUS
  Filled 2012-04-18: qty 70

## 2012-04-18 MED ORDER — SODIUM CHLORIDE 0.9 % IJ SOLN
10.0000 mL | INTRAMUSCULAR | Status: DC | PRN
  Administered 2012-04-18: 10 mL
  Filled 2012-04-18: qty 10

## 2012-04-18 MED ORDER — VINCRISTINE SULFATE CHEMO INJECTION 1 MG/ML
2.0000 mg | Freq: Once | INTRAVENOUS | Status: AC
  Administered 2012-04-18: 2 mg via INTRAVENOUS
  Filled 2012-04-18: qty 2

## 2012-04-18 MED ORDER — ACETAMINOPHEN 325 MG PO TABS
650.0000 mg | ORAL_TABLET | Freq: Once | ORAL | Status: AC
  Administered 2012-04-18: 650 mg via ORAL

## 2012-04-18 MED ORDER — ONDANSETRON 16 MG/50ML IVPB (CHCC)
16.0000 mg | Freq: Once | INTRAVENOUS | Status: AC
  Administered 2012-04-18: 16 mg via INTRAVENOUS

## 2012-04-18 MED ORDER — DEXAMETHASONE SODIUM PHOSPHATE 10 MG/ML IJ SOLN
20.0000 mg | Freq: Once | INTRAMUSCULAR | Status: AC
  Administered 2012-04-18: 20 mg via INTRAVENOUS

## 2012-04-18 MED ORDER — DIPHENHYDRAMINE HCL 25 MG PO CAPS
50.0000 mg | ORAL_CAPSULE | Freq: Once | ORAL | Status: AC
  Administered 2012-04-18: 50 mg via ORAL

## 2012-04-18 MED ORDER — SODIUM CHLORIDE 0.9 % IV SOLN
Freq: Once | INTRAVENOUS | Status: AC
  Administered 2012-04-18: 09:00:00 via INTRAVENOUS

## 2012-04-18 MED ORDER — VINCRISTINE SULFATE CHEMO INJECTION 1 MG/ML: 2.0000 mg | Freq: Once | INTRAVENOUS | Status: DC

## 2012-04-18 MED ORDER — DOXORUBICIN HCL CHEMO IV INJECTION 2 MG/ML
50.0000 mg/m2 | Freq: Once | INTRAVENOUS | Status: AC
  Administered 2012-04-18: 94 mg via INTRAVENOUS
  Filled 2012-04-18: qty 47

## 2012-04-18 MED ORDER — SODIUM CHLORIDE 0.9 % IV SOLN
375.0000 mg/m2 | Freq: Once | INTRAVENOUS | Status: AC
  Administered 2012-04-18: 700 mg via INTRAVENOUS
  Filled 2012-04-18: qty 70

## 2012-04-18 NOTE — Patient Instructions (Signed)
[facility] Discharge Instructions for Patients Receiving Chemotherapy  Today you received the following chemotherapy agents Adriamycin, Vincristine, Cytoxan and Rituxan  To help prevent nausea and vomiting after your treatment, we encourage you to take your nausea medication Ondansetron (Zofran) tonight before bed, then twice daily for the next two days.  You may take prochlorperazine (Compazine) if you have any breakthrough nausea.   If you develop nausea and vomiting that is not controlled by your nausea medication, call the clinic. If it is after clinic hours your family physician or the after hours number for the clinic or go to the Emergency Department.   BELOW ARE SYMPTOMS THAT SHOULD BE REPORTED IMMEDIATELY:  *FEVER GREATER THAN 100.5 F  *CHILLS WITH OR WITHOUT FEVER  NAUSEA AND VOMITING THAT IS NOT CONTROLLED WITH YOUR NAUSEA MEDICATION  *UNUSUAL SHORTNESS OF BREATH  *UNUSUAL BRUISING OR BLEEDING  TENDERNESS IN MOUTH AND THROAT WITH OR WITHOUT PRESENCE OF ULCERS  *URINARY PROBLEMS  *BOWEL PROBLEMS  UNUSUAL RASH Items with * indicate a potential emergency and should be followed up as soon as possible.  One of the nurses will contact you 24 hours after your treatment. Please let the nurse know about any problems that you may have experienced. Feel free to call the clinic you have any questions or concerns. The clinic phone number is [phone].   I have been informed and understand all the instructions given to me. I know to contact the clinic, my physician, or go to the Emergency Department if any problems should occur. I do not have any questions at this time, but understand that I may call the clinic during office hours   should I have any questions or need assistance in obtaining follow up care.    Adriamycin (Doxorubicin) What is this medicine? DOXORUBICIN (dox oh ROO bi sin) is a chemotherapy drug. It is used to treat many kinds of cancer like  Hodgkin's disease, leukemia, non-Hodgkin's lymphoma, neuroblastoma, sarcoma, and Wilms' tumor. It is also used to treat bladder cancer, breast cancer, lung cancer, ovarian cancer, stomach cancer, and thyroid cancer. This medicine may be used for other purposes; ask your health care provider or pharmacist if you have questions.  What should I tell my health care provider before I take this medicine? They need to know if you have any of these conditions: -blood disorders -heart disease, recent heart attack -infection (especially a virus infection such as chickenpox, cold sores, or herpes) -irregular heartbeat -liver disease -recent or ongoing radiation therapy -an unusual or allergic reaction to doxorubicin, other chemotherapy agents, other medicines, foods, dyes, or preservatives -pregnant or trying to get pregnant -breast-feeding  How should I use this medicine? This drug is given as an infusion into a vein. It is administered in a hospital or clinic by a specially trained health care professional. If you have pain, swelling, burning or any unusual feeling around the site of your injection, tell your health care professional right away. Talk to your pediatrician regarding the use of this medicine in children. Special care may be needed. Overdosage: If you think you have taken too much of this medicine contact a poison control center or emergency room at once. NOTE: This medicine is only for you. Do not share this medicine with others.  What if I miss a dose? It is important not to miss your dose. Call your doctor or health care professional if you are unable to keep an appointment. What may interact with this medicine?  Do not take this medicine with any of the following medications: -cisapride -droperidol -halofantrine -pimozide -zidovudine This medicine may also interact with the following  medications: -chloroquine -chlorpromazine -clarithromycin -cyclophosphamide -cyclosporine -erythromycin -medicines for depression, anxiety, or psychotic disturbances -medicines for irregular heart beat like amiodarone, bepridil, dofetilide, encainide, flecainide, propafenone, quinidine -medicines for seizures like ethotoin, fosphenytoin, phenytoin -medicines for nausea, vomiting like dolasetron, ondansetron, palonosetron -medicines to increase blood counts like filgrastim, pegfilgrastim, sargramostim -methadone -methotrexate -pentamidine -progesterone -vaccines -verapamil  Talk to your doctor or health care professional before taking any of these medicines: -acetaminophen -aspirin -ibuprofen -ketoprofen -naproxen This list may not describe all possible interactions. Give your health care provider a list of all the medicines, herbs, non-prescription drugs, or dietary supplements you use. Also tell them if you smoke, drink alcohol, or use illegal drugs. Some items may interact with your medicine.  What should I watch for while using this medicine? Your condition will be monitored carefully while you are receiving this medicine. You will need important blood work done while you are taking this medicine. This drug may make you feel generally unwell. This is not uncommon, as chemotherapy can affect healthy cells as well as cancer cells. Report any side effects. Continue your course of treatment even though you feel ill unless your doctor tells you to stop. Your urine may turn red for a few days after your dose. This is not blood. If your urine is dark or brown, call your doctor. In some cases, you may be given additional medicines to help with side effects. Follow all directions for their use. Call your doctor or health care professional for advice if you get a fever, chills or sore throat, or other symptoms of a cold or flu. Do not treat yourself. This drug decreases your body's ability  to fight infections. Try to avoid being around people who are sick. This medicine may increase your risk to bruise or bleed. Call your doctor or health care professional if you notice any unusual bleeding. Be careful brushing and flossing your teeth or using a toothpick because you may get an infection or bleed more easily. If you have any dental work done, tell your dentist you are receiving this medicine. Avoid taking products that contain aspirin, acetaminophen, ibuprofen, naproxen, or ketoprofen unless instructed by your doctor. These medicines may hide a fever. Men and women of childbearing age should use effective birth control methods while using taking this medicine. Do not become pregnant while taking this medicine. There is a potential for serious side effects to an unborn child. Talk to your health care professional or pharmacist for more information. Do not breast-feed an infant while taking this medicine. Do not let others touch your urine or other body fluids for 5 days after each treatment with this medicine. Caregivers should wear latex gloves to avoid touching body fluids during this time.  What side effects may I notice from receiving this medicine? Side effects that you should report to your doctor or health care professional as soon as possible: -allergic reactions like skin rash, itching or hives, swelling of the face, lips, or tongue -low blood counts - this medicine may decrease the number of white blood cells, red blood cells and platelets. You may be at increased risk for infections and bleeding. -signs of infection - fever or chills, cough, sore throat, pain or difficulty passing urine -signs of decreased platelets or bleeding - bruising, pinpoint red spots on the skin, black, tarry stools, blood in the  urine -signs of decreased red blood cells - unusually weak or tired, fainting spells, lightheadedness -breathing problems -chest pain -fast, irregular heartbeat -mouth  sores -nausea, vomiting -pain, swelling, redness at site where injected -pain, tingling, numbness in the hands or feet -swelling of ankles, feet, or hands -unusual bleeding or bruising  Side effects that usually do not require medical attention (report to your doctor or health care professional if they continue or are bothersome): -diarrhea -facial flushing -hair loss -loss of appetite -missed menstrual periods -nail discoloration or damage -red or watery eyes -red colored urine -stomach upset This list may not describe all possible side effects. Call your doctor for medical advice about side effects. You may report side effects to FDA at 1-800-FDA-1088. Where should I keep my medicine? This drug is given in a hospital or clinic and will not be stored at home. NOTE: This sheet is a summary. It may not cover all possible information. If you have questions about this medicine, talk to your doctor, pharmacist, or health care provider.  2012, [employer]. (07/15/2007 5:07:32 PM)  Vincristine  What is this medicine? VINCRISTINE (vin KRIS teen) is a chemotherapy drug. It slows the growth of cancer cells. This medicine is used to treat many types of cancer like Hodgkin's disease, leukemia, non-Hodgkin's lymphoma, neuroblastoma (brain cancer), rhabdomyosarcoma, and Wilms' tumor. This medicine may be used for other purposes; ask your health care provider or pharmacist if you have questions.  What should I tell my health care provider before I take this medicine? They need to know if you have any of these conditions: -blood disorders -gout -infection (especially chickenpox, cold sores, or herpes) -kidney disease -liver disease -lung disease -nervous system disease like Charcot-Marie-Tooth (CMT) -recent or ongoing radiation therapy -an unusual or allergic reaction to vincristine, other chemotherapy agents, other medicines, foods, dyes, or preservatives -pregnant or trying to get  pregnant -breast-feeding  How should I use this medicine? This drug is given as an infusion into a vein. It is administered in a hospital or clinic by a specially trained health care professional. If you have pain, swelling, burning, or any unusual feeling around the site of your injection, tell your health care professional right away. Talk to your pediatrician regarding the use of this medicine in children. While this drug may be prescribed for selected conditions, precautions do apply. Overdosage: If you think you have taken too much of this medicine contact a poison control center or emergency room at once. NOTE: This medicine is only for you. Do not share this medicine with others.  What if I miss a dose? It is important not to miss your dose. Call your doctor or health care professional if you are unable to keep an appointment.  What may interact with this medicine? Do not take this medicine with any of the following medications: -itraconazole -mibefradil -voriconazole This medicine may also interact with the following medications: -cyclosporine -erythromycin -fluconazole -ketoconazole -medicines for HIV like delavirdine, efavirenz, nevirapine -medicines for seizures like ethotoin, fosphenotoin, phenytoin -medicines to increase blood counts like filgrastim, pegfilgrastim, sargramostim -other chemotherapy drugs like cisplatin, L-asparaginase, methotrexate, mitomycin, paclitaxel -pegaspargase -vaccines -zalcitabine, ddC  Talk to your doctor or health care professional before taking any of these medicines: -acetaminophen -aspirin -ibuprofen -ketoprofen -naproxen This list may not describe all possible interactions. Give your health care provider a list of all the medicines, herbs, non-prescription drugs, or dietary supplements you use. Also tell them if you smoke, drink alcohol, or use illegal drugs. Some  items may interact with your medicine.  What should I watch for while  using this medicine? Your condition will be monitored carefully while you are receiving this medicine. You will need important blood work done while you are taking this medicine. This drug may make you feel generally unwell. This is not uncommon, as chemotherapy can affect healthy cells as well as cancer cells. Report any side effects. Continue your course of treatment even though you feel ill unless your doctor tells you to stop. In some cases, you may be given additional medicines to help with side effects. Follow all directions for their use. Call your doctor or health care professional for advice if you get a fever, chills or sore throat, or other symptoms of a cold or flu. Do not treat yourself. Avoid taking products that contain aspirin, acetaminophen, ibuprofen, naproxen, or ketoprofen unless instructed by your doctor. These medicines may hide a fever. Do not become pregnant while taking this medicine. Women should inform their doctor if they wish to become pregnant or think they might be pregnant. There is a potential for serious side effects to an unborn child. Talk to your health care professional or pharmacist for more information. Do not breast-feed an infant while taking this medicine. Men may have a lower sperm count while taking this medicine. Talk to your doctor if you plan to father a child.  What side effects may I notice from receiving this medicine? Side effects that you should report to your doctor or health care professional as soon as possible: -allergic reactions like skin rash, itching or hives, swelling of the face, lips, or tongue -breathing problems -confusion or changes in emotions or moods -constipation -cough -mouth sores -muscle weakness -nausea and vomiting -pain, swelling, redness or irritation at the injection site -pain, tingling, numbness in the hands or feet -problems with balance, talking, walking -seizures -stomach pain -trouble passing urine or change  in the amount of urine  Side effects that usually do not require medical attention (report to your doctor or health care professional if they continue or are bothersome): -diarrhea -hair loss -jaw pain -loss of appetite This list may not describe all possible side effects. Call your doctor for medical advice about side effects. You may report side effects to FDA at 1-800-FDA-1088. Where should I keep my medicine? This drug is given in a hospital or clinic and will not be stored at home. NOTE: This sheet is a summary. It may not cover all possible information. If you have questions about this medicine, talk to your doctor, pharmacist, or health care provider.  2012, [employer] Standard. (12/22/2007 5:17:13 PM)  Cytoxan (Cyclophosphamide0 What is this medicine? CYCLOPHOSPHAMIDE (sye kloe FOSS fa mide) is a chemotherapy drug. It slows the growth of cancer cells. This medicine is used to treat many types of cancer like lymphoma, myeloma, leukemia, breast cancer, and ovarian cancer, to name a few. It is also used to treat nephrotic syndrome in children. This medicine may be used for other purposes; ask your health care provider or pharmacist if you have questions.  What should I tell my health care provider before I take this medicine? They need to know if you have any of these conditions: -blood disorders -history of other chemotherapy -history of radiation therapy -infection -kidney disease -liver disease -tumors in the bone marrow -an unusual or allergic reaction to cyclophosphamide, other chemotherapy, other medicines, foods, dyes, or preservatives -pregnant or trying to get pregnant -breast-feeding  How should I use this  medicine? This drug is usually given as an injection into a vein or muscle or by infusion into a vein. It is administered in a hospital or clinic by a specially trained health care professional. Talk to your pediatrician regarding the use of this medicine in  children. While this drug may be prescribed for selected conditions, precautions do apply. Overdosage: If you think you have taken too much of this medicine contact a poison control center or emergency room at once. NOTE: This medicine is only for you. Do not share this medicine with others.  What if I miss a dose? It is important not to miss your dose. Call your doctor or health care professional if you are unable to keep an appointment. What may interact with this medicine? Do not take this medicine with any of the following medications: -mibefradil -nalidixic acid This medicine may also interact with the following medications: -doxorubicin -etanercept -medicines to increase blood counts like filgrastim, pegfilgrastim, sargramostim -medicines that block muscle or nerve pain -St. N[place]'s Wort -phenobarbital -succinylcholine chloride -trastuzumab -vaccines  Talk to your doctor or health care professional before taking any of these medicines: -acetaminophen -aspirin -ibuprofen -ketoprofen -naproxen This list may not describe all possible interactions. Give your health care provider a list of all the medicines, herbs, non-prescription drugs, or dietary supplements you use. Also tell them if you smoke, drink alcohol, or use illegal drugs. Some items may interact with your medicine.  What should I watch for while using this medicine? Visit your doctor for checks on your progress. This drug may make you feel generally unwell. This is not uncommon, as chemotherapy can affect healthy cells as well as cancer cells. Report any side effects. Continue your course of treatment even though you feel ill unless your doctor tells you to stop. Drink water or other fluids as directed. Urinate often, even at night. In some cases, you may be given additional medicines to help with side effects. Follow all directions for their use. Call your doctor or health care professional for advice if you get a  fever, chills or sore throat, or other symptoms of a cold or flu. Do not treat yourself. This drug decreases your body's ability to fight infections. Try to avoid being around people who are sick. This medicine may increase your risk to bruise or bleed. Call your doctor or health care professional if you notice any unusual bleeding. Be careful brushing and flossing your teeth or using a toothpick because you may get an infection or bleed more easily. If you have any dental work done, tell your dentist you are receiving this medicine. Avoid taking products that contain aspirin, acetaminophen, ibuprofen, naproxen, or ketoprofen unless instructed by your doctor. These medicines may hide a fever. Do not become pregnant while taking this medicine. Women should inform their doctor if they wish to become pregnant or think they might be pregnant. There is a potential for serious side effects to an unborn child. Talk to your health care professional or pharmacist for more information. Do not breast-feed an infant while taking this medicine. Men should inform their doctor if they wish to father a child. This medicine may lower sperm counts. If you are going to have surgery, tell your doctor or health care professional that you have taken this medicine.  What side effects may I notice from receiving this medicine? Side effects that you should report to your doctor or health care professional as soon as possible: -allergic reactions like skin rash, itching  or hives, swelling of the face, lips, or tongue -low blood counts - this medicine may decrease the number of white blood cells, red blood cells and platelets. You may be at increased risk for infections and bleeding. -signs of infection - fever or chills, cough, sore throat, pain or difficulty passing urine -signs of decreased platelets or bleeding - bruising, pinpoint red spots on the skin, black, tarry stools, blood in the urine -signs of decreased red blood  cells - unusually weak or tired, fainting spells, lightheadedness -breathing problems -dark urine -mouth sores -pain, swelling, redness at site where injected -swelling of the ankles, feet, hands -trouble passing urine or change in the amount of urine -weight gain -yellowing of the eyes or skin  Side effects that usually do not require medical attention (report to your doctor or health care professional if they continue or are bothersome): -changes in nail or skin color -diarrhea -hair loss -loss of appetite -missed menstrual periods -nausea, vomiting -stomach pain This list may not describe all possible side effects. Call your doctor for medical advice about side effects. You may report side effects to FDA at 1-800-FDA-1088. Where should I keep my medicine? This drug is given in a hospital or clinic and will not be stored at home. NOTE: This sheet is a summary. It may not cover all possible information. If you have questions about this medicine, talk to your doctor, pharmacist, or health care provider.  2012, [employer] Standard. (07/01/2007 2:32:25 PM)  Rituxan (Rituximab) What is this medicine? RITUXIMAB (ri TUX i mab) is a monoclonal antibody. This medicine changes the way the body's immune system works. It is used commonly to treat non-Hodgkin's lymphoma and other conditions. In cancer cells, this drug targets a specific protein within cancer cells and stops the cancer cells from growing. It is also used to treat rhuematoid arthritis (RA). In RA, this medicine slow the inflammatory process and help reduce joint pain and swelling. This medicine is often used with other cancer or arthritis medications. This medicine may be used for other purposes; ask your health care provider or pharmacist if you have questions.  What should I tell my health care provider before I take this medicine? They need to know if you have any of these conditions: -blood disorders -heart  disease -history of hepatitis B -infection (especially a virus infection such as chickenpox, cold sores, or herpes) -irregular heartbeat -kidney disease -lung or breathing disease, like asthma -lupus -an unusual or allergic reaction to rituximab, mouse proteins, other medicines, foods, dyes, or preservatives -pregnant or trying to get pregnant -breast-feeding  How should I use this medicine? This medicine is for infusion into a vein. It is administered in a hospital or clinic by a specially trained health care professional. A special MedGuide will be given to you by the pharmacist with each prescription and refill. Be sure to read this information carefully each time. Talk to your pediatrician regarding the use of this medicine in children. This medicine is not approved for use in children. Overdosage: If you think you have taken too much of this medicine contact a poison control center or emergency room at once. NOTE: This medicine is only for you. Do not share this medicine with others.  What if I miss a dose? It is important not to miss a dose. Call your doctor or health care professional if you are unable to keep an appointment.  What may interact with this medicine? -cisplatin -medicines for blood pressure -some other medicines  for arthritis -vaccines This list may not describe all possible interactions. Give your health care provider a list of all the medicines, herbs, non-prescription drugs, or dietary supplements you use. Also tell them if you smoke, drink alcohol, or use illegal drugs. Some items may interact with your medicine.  What should I watch for while using this medicine? Report any side effects that you notice during your treatment right away, such as changes in your breathing, fever, chills, dizziness or lightheadedness. These effects are more common with the first dose. Visit your prescriber or health care professional for checks on your progress. You will need to  have regular blood work. Report any other side effects. The side effects of this medicine can continue after you finish your treatment. Continue your course of treatment even though you feel ill unless your doctor tells you to stop. Call your doctor or health care professional for advice if you get a fever, chills or sore throat, or other symptoms of a cold or flu. Do not treat yourself. This drug decreases your body's ability to fight infections. Try to avoid being around people who are sick. This medicine may increase your risk to bruise or bleed. Call your doctor or health care professional if you notice any unusual bleeding. Be careful brushing and flossing your teeth or using a toothpick because you may get an infection or bleed more easily. If you have any dental work done, tell your dentist you are receiving this medicine. Avoid taking products that contain aspirin, acetaminophen, ibuprofen, naproxen, or ketoprofen unless instructed by your doctor. These medicines may hide a fever. Do not become pregnant while taking this medicine. Women should inform their doctor if they wish to become pregnant or think they might be pregnant. There is a potential for serious side effects to an unborn child. Talk to your health care professional or pharmacist for more information. Do not breast-feed an infant while taking this medicine.  What side effects may I notice from receiving this medicine? Side effects that you should report to your doctor or health care professional as soon as possible: -allergic reactions like skin rash, itching or hives, swelling of the face, lips, or tongue -low blood counts - this medicine may decrease the number of white blood cells, red blood cells and platelets. You may be at increased risk for infections and bleeding. -signs of infection - fever or chills, cough, sore throat, pain or difficulty passing urine -signs of decreased platelets or bleeding - bruising, pinpoint red spots  on the skin, black, tarry stools, blood in the urine -signs of decreased red blood cells - unusually weak or tired, fainting spells, lightheadedness -breathing problems -confused, not responsive -chest pain -fast, irregular heartbeat -feeling faint or lightheaded, falls -mouth sores -redness, blistering, peeling or loosening of the skin, including inside the mouth -stomach pain -swelling of the ankles, feet, or hands -trouble passing urine or change in the amount of urine  Side effects that usually do not require medical attention (report to your doctor or other health care professional if they continue or are bothersome): -anxiety -headache -loss of appetite -muscle aches -nausea -night sweats This list may not describe all possible side effects. Call your doctor for medical advice about side effects. You may report side effects to FDA at 1-800-FDA-1088. Where should I keep my medicine? This drug is given in a hospital or clinic and will not be stored at home. NOTE: This sheet is a summary. It may not cover all possible  information. If you have questions about this medicine, talk to your doctor, pharmacist, or health care provider.  2013, [employer]. (11/24/2007 2:04:59 PM)

## 2012-04-19 ENCOUNTER — Ambulatory Visit (HOSPITAL_BASED_OUTPATIENT_CLINIC_OR_DEPARTMENT_OTHER): Payer: Medicare Other

## 2012-04-19 VITALS — BP 129/80 | HR 90 | Temp 97.9°F

## 2012-04-19 DIAGNOSIS — C8291 Follicular lymphoma, unspecified, lymph nodes of head, face, and neck: Secondary | ICD-10-CM

## 2012-04-19 DIAGNOSIS — C8591 Non-Hodgkin lymphoma, unspecified, lymph nodes of head, face, and neck: Secondary | ICD-10-CM

## 2012-04-19 MED ORDER — PEGFILGRASTIM INJECTION 6 MG/0.6ML
6.0000 mg | Freq: Once | SUBCUTANEOUS | Status: AC
  Administered 2012-04-19: 6 mg via SUBCUTANEOUS

## 2012-04-21 ENCOUNTER — Telehealth: Payer: Self-pay | Admitting: *Deleted

## 2012-04-21 NOTE — Telephone Encounter (Signed)
Pt reports new onset Jaw pain/ aches and sore especially with eating started yesterday.  He wants to know if this is side effect from chemo,  How long to expect it to last and if ok to take tylenol for the pain?

## 2012-04-21 NOTE — Telephone Encounter (Signed)
He had Neulasta.  It can cause bone pain.  He can take Tylenol 325mg  PO every 8 hours as needed.  Pain should resolve after 5-7 days.

## 2012-04-21 NOTE — Telephone Encounter (Signed)
Called pt back w/ Dr. Lodema Pilot response below.  Instructed him to call back if tylenol does not help the pain.  He verbalized understanding.

## 2012-04-21 NOTE — Telephone Encounter (Signed)
PT. HAS HAD SOME DISCOMFORT WITH HIS JAW WHEN EATING. HE CALLED THE DOCTOR WHO SAID IT WAS PROBABLY FROM THE INJECTION PT. RECEIVED ON Saturday AND HIS JAW DISCOMFORT SHOULD IMPROVE. INSTRUCTED PT. TO REFER TO HIS CHEMO INFORMATION SHEET. HE VOICES UNDERSTANDING. NO OTHER PROBLEMS OR QUESTIONS AT THIS TIME. PT. WILL CALL THIS OFFICE OR THE ON CALL PHYSICIAN IF THE NEED ARISES.

## 2012-04-23 ENCOUNTER — Telehealth: Payer: Self-pay | Admitting: *Deleted

## 2012-04-23 ENCOUNTER — Telehealth: Payer: Self-pay | Admitting: Oncology

## 2012-04-23 DIAGNOSIS — C8591 Non-Hodgkin lymphoma, unspecified, lymph nodes of head, face, and neck: Secondary | ICD-10-CM

## 2012-04-23 MED ORDER — HYDROCODONE-ACETAMINOPHEN 5-325 MG PO TABS: 1.0000 | ORAL_TABLET | Freq: Four times a day (QID) | ORAL | Status: DC | PRN

## 2012-04-23 NOTE — Telephone Encounter (Signed)
Added lab and appt for this pt..per orders 1.15.14 pof...the patient already aware

## 2012-04-23 NOTE — Telephone Encounter (Signed)
Pt called to report his jaw pain/ aching is not relieved w/ taking tylenol.  He has trouble sleeping past few nights and eating due to the jaw pain.

## 2012-04-23 NOTE — Telephone Encounter (Signed)
This is not common for R-CHOP.  He has poor dental condition.  He needs to see a dentist ASAP.  Thanks.

## 2012-04-23 NOTE — Telephone Encounter (Signed)
Called pt back initially and instructed to see dentist asap per Dr. Gaylyn Rong.   Then Dr. Gaylyn Rong requested mid level see pt today if possible.  Norina Buzzard can see pt at 1 pm.   I called pt back x 2 and left Voice mails asking him to return call to be seen here this afternoon.  Arrive at 12:30 pm for lab and 1 pm for office visit.  Waiting for pt to return call.

## 2012-04-23 NOTE — Telephone Encounter (Signed)
Daughter called back to report pt saw dentist in [place] and he said no signs of infection present.  She says pt still having pain and that he does not have history of c/o pain,  It has kept him up at night.   Informed her we tried to get pt in for appt today but unable to reach him as he was already at dentist.   Informed Dr. Gaylyn Rong of above.  He ordered vicodin and for pt to be seen by Belenda Cruise w/ labs on Friday.   Informed daughter this RN will call in Rx for vicodin per Dr. Gaylyn Rong and appt on Friday at 9:15 am for lab and see Belenda Cruise at 9;45 am.  She verbalized understanding.

## 2012-04-24 ENCOUNTER — Ambulatory Visit (HOSPITAL_BASED_OUTPATIENT_CLINIC_OR_DEPARTMENT_OTHER): Payer: Medicare Other | Admitting: Lab

## 2012-04-24 ENCOUNTER — Telehealth: Payer: Self-pay | Admitting: Oncology

## 2012-04-24 ENCOUNTER — Encounter: Payer: Self-pay | Admitting: Oncology

## 2012-04-24 ENCOUNTER — Other Ambulatory Visit: Payer: Self-pay | Admitting: Oncology

## 2012-04-24 ENCOUNTER — Ambulatory Visit (HOSPITAL_BASED_OUTPATIENT_CLINIC_OR_DEPARTMENT_OTHER): Payer: Medicare Other | Admitting: Oncology

## 2012-04-24 ENCOUNTER — Ambulatory Visit (HOSPITAL_COMMUNITY)
Admission: RE | Admit: 2012-04-24 | Discharge: 2012-04-24 | Disposition: A | Discharge status: Home / Self Care | Payer: Medicare Other | Source: Ambulatory Visit | Attending: Oncology | Admitting: Oncology

## 2012-04-24 ENCOUNTER — Telehealth: Payer: Self-pay | Admitting: *Deleted

## 2012-04-24 VITALS — BP 122/82 | HR 117 | Temp 97.0°F | Resp 20 | Ht 71.0 in | Wt 148.8 lb

## 2012-04-24 DIAGNOSIS — C8291 Follicular lymphoma, unspecified, lymph nodes of head, face, and neck: Secondary | ICD-10-CM

## 2012-04-24 DIAGNOSIS — C8591 Non-Hodgkin lymphoma, unspecified, lymph nodes of head, face, and neck: Secondary | ICD-10-CM

## 2012-04-24 DIAGNOSIS — R6884 Jaw pain: Secondary | ICD-10-CM

## 2012-04-24 DIAGNOSIS — C8581 Other specified types of non-Hodgkin lymphoma, lymph nodes of head, face, and neck: Secondary | ICD-10-CM | POA: Insufficient documentation

## 2012-04-24 DIAGNOSIS — C189 Malignant neoplasm of colon, unspecified: Secondary | ICD-10-CM

## 2012-04-24 DIAGNOSIS — C679 Malignant neoplasm of bladder, unspecified: Secondary | ICD-10-CM

## 2012-04-24 LAB — COMPREHENSIVE METABOLIC PANEL (CC13)
Albumin: 3.8 g/dL (ref 3.5–5.0)
Alkaline Phosphatase: 78 U/L (ref 40–150)
CO2: 26 mEq/L (ref 22–29)
Glucose: 92 mg/dl (ref 70–99)
Potassium: 4.2 mEq/L (ref 3.5–5.1)
Sodium: 135 mEq/L — ABNORMAL LOW (ref 136–145)
Total Protein: 7.2 g/dL (ref 6.4–8.3)

## 2012-04-24 LAB — CBC WITH DIFFERENTIAL/PLATELET
Basophils Absolute: 0 10*3/uL (ref 0.0–0.1)
EOS%: 11.7 % — ABNORMAL HIGH (ref 0.0–7.0)
HGB: 14.8 g/dL (ref 13.0–17.1)
MCH: 31.7 pg (ref 27.2–33.4)
MCHC: 36 g/dL (ref 32.0–36.0)
MCV: 88 fL (ref 79.3–98.0)
MONO%: 3.9 % (ref 0.0–14.0)
RBC: 4.67 10*6/uL (ref 4.20–5.82)
RDW: 11.8 % (ref 11.0–14.6)

## 2012-04-24 LAB — URIC ACID (CC13): Uric Acid, Serum: 3.9 mg/dl (ref 2.6–7.4)

## 2012-04-24 LAB — LACTATE DEHYDROGENASE (CC13): LDH: 154 U/L (ref 125–245)

## 2012-04-24 NOTE — Progress Notes (Signed)
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   PAST THERAPY:  Biopsy only.   CURRENT THERAPY:  R-CHOP started on 04/18/12.  Goal is for 4 cycles with.  Plan is also to start intrathecal chemo with cycles 2-4.  Plan is also for consolidative radiation after finish of chemo.   INTERVAL HISTORY: Kenneth Reed 67 y.o. male returns for a work in appointment today with his daughter. Patient developed jaw pain that is worse with chewing about 5 days ago. He was evaluated by his dentist who did not visualize any abnormality in his mouth. He was given Hydrocodone for pain yesterday. Daughter called this morning reporting he wasn't getting any relief. Patient tells me today that  He was able to rest well last night with pain medication. Pain is controlled today with a level of 2-3/10. Pain can get up to 7/10 when chewing food. Pain is in the bilateral jaw. Denies headaches, ear pain, rhinitis. No difficulty swallowing. Reports popping of jaw at times. No swelling or abnormal lumps around jaw. He tolerated first chemotherapy well without any nausea or vomiting.  Patient denies fever, anorexia, weight loss, fatigue, headache, visual changes, confusion, drenching night sweats, palpable lymph node swelling, mucositis, odynophagia, dysphagia, nausea vomiting, jaundice, chest pain, palpitation, shortness of breath, dyspnea on exertion, productive cough, gum bleeding, epistaxis, hematemesis, hemoptysis, abdominal pain, abdominal swelling, early satiety, melena, hematochezia, hematuria, skin rash, spontaneous bleeding, joint swelling, joint pain, heat or cold intolerance, bowel bladder incontinence, back pain, focal motor weakness, paresthesia, depression, suicidal or homicidal ideation, feeling hopelessness.   Past Medical History    Diagnosis Date  . Non-Hodgkin's lymphoma of head   . Fatigue   . Colon cancer 2010    Dr. Gabriel Cirri operated; stage II.  No need for chemo.  . Bladder cancer 2010    Dr. Rito Ehrlich operated; no need for chemo; recurred locally  in 2013 locally.    Marland Kitchen BPH (benign prostatic hyperplasia)     Past Surgical History  Procedure Date  . Nasal polyp surgery 1983  . Colectomy 2010  . Bladder surgery 2010  . Colonoscopy 2012    next one 2014.     Current Outpatient Prescriptions  Medication Sig Dispense Refill  . albuterol (PROVENTIL HFA;VENTOLIN HFA) 108 (90 BASE) MCG/ACT inhaler Inhale 2 puffs into the lungs every 4 (four) hours as needed. For shortness of breath.      . finasteride (PROSCAR) 5 MG tablet Take 5 mg by mouth at bedtime.       . Fluticasone-Salmeterol (ADVAIR) 250-50 MCG/DOSE AEPB Inhale 1 puff into the lungs every 12 (twelve) hours.      Marland Kitchen HYDROcodone-acetaminophen (NORCO) 5-325 MG per tablet Take 1 tablet by mouth every 6 (six) hours as needed for pain.  30 tablet  0  . lidocaine-prilocaine (EMLA) cream Apply 1 application topically daily as needed. Applies to port-a-cath site one hour prior to needle stick.      Marland Kitchen ondansetron (ZOFRAN) 8 MG tablet Take 8 mg by mouth 2 (two) times daily. He is to take two times a day starting the day after chemo for 3 days. Then take two times a day as needed for nausea or vomiting.      . predniSONE (DELTASONE) 20 MG tablet Take 40 mg/m2 by mouth daily. He  is to take on days 1-5 of chemotherapy.      . prochlorperazine (COMPAZINE) 10 MG tablet Take 10 mg by mouth every 6 (six) hours as needed. For nausea.        ALLERGIES:   has no known allergies.  REVIEW OF SYSTEMS:  The rest of the 14-point review of system was negative.   Filed Vitals:   04/24/12 1404  BP: 122/82  Pulse: 117  Temp: 97 F (36.1 C)  Resp: 20   Wt Readings from Last 3 Encounters:  04/24/12 148 lb 12.8 oz (67.495 kg)  04/17/12 151 lb (68.493 kg)  04/07/12 152 lb 1.6  oz (68.992 kg)   ECOG Performance status: 0  PHYSICAL EXAMINATION:   General:  Thin-appearing man, in no acute distress.  Eyes:  no scleral icterus.  ENT:  Tympanic membranes are clear. Fungating mass in the left tonsil has reduced in size. Click noted when opening and closing jaw. Neck was without thyromegaly.  Lymphatics:  Negative cervical, supraclavicular or axillary adenopathy.  Respiratory: lungs were clear bilaterally without wheezing or crackles.  Cardiovascular:  Regular rate and rhythm, S1/S2, without murmur, rub or gallop.  There was no pedal edema.  GI:  abdomen was soft, flat, nontender, nondistended, without organomegaly.  Muscoloskeletal:  no spinal tenderness of palpation of vertebral spine.  Skin exam was without echymosis, petichae.  Neuro exam was nonfocal.  Patient was able to get on and off exam table without assistance.  Gait was normal.  Patient was alerted and oriented.  Attention was good.   Language was appropriate.  Mood was normal without depression.  Speech was not pressured.  Thought content was not tangential.      LABORATORY/RADIOLOGY DATA:  Lab Results  Component Value Date   WBC 1.0* 04/24/2012   HGB 14.8 04/24/2012   HCT 41.1 04/24/2012   PLT 125* 04/24/2012   GLUCOSE 92 04/24/2012   ALKPHOS 78 04/24/2012   ALT 13 04/24/2012   AST 11 04/24/2012   NA 135* 04/24/2012   K 4.2 04/24/2012   CL 98 04/24/2012   CREATININE 0.9 04/24/2012   BUN 15.0 04/24/2012   CO2 26 04/24/2012   INR 0.97 04/15/2012   RADIOLOGY:  *RADIOLOGY REPORT*  Clinical Data: Diffuse jaw pain.  ORTHOPANTOGRAM/PANORAMIC  Comparison: None.  Findings: No acute bony abnormality. No visible acute mandibular  abnormality. No fracture. Visualized paranasal sinuses appear  clear.  IMPRESSION:  No acute bony abnormality.  Original Report Authenticated By: Charlett Nose, M.D.  ASSESSMENT AND PLAN:   1. History of stage II colon cancer; s/p colectomy in 2010. He is supposedly in remission. His last  colonoscopy in 2012 was reportedly negative but is due for one more in 2014.   2. History of bladder cancer in 2010; with local recurrence in 2013.    3. High grade follicular lymphoma of right tonsil; IPI of 1 (or low risk)  - Stage: I.  PET scan did not show disease elsewhere.  Bone marrow biopsy did not show lymphoma. - Prognosis:  Chance of cure is about 80-90%.  - Treatment:  4 cycles of chemo R-CHOP.  Intrathecal chemo methotrexate with cycles 2,3, and 4 to decrease risk of lymphoma in the brain in the future. I wanted to skip intrathecal chemo with the fist cycle of R-CHOP to ensure that he tolerates R-CHOP well.   Radiation after chemo is finished to decrease risk of recurrence.  - Chemotherapy:  S/P 1 cycle of R-CHOP chemo given  once every 3 weeks; with day #2 injection of Neulasta to decrease risk of infection.  Tolerated cycle 1 well. - To assess response to chemo, a repeat scan will be obtained after 4 cycles of chemo.  We can assess clinical response with clinical exam during chemo.    4.  Nausea/vomiting:  He has Compazine/Zofran prn.   5. Jaw pain: Etiology unclear. Could be due to TMJ. X-ray was negative, dental exam negative, and exam today did not reveal any abnormalities except jaw popping. He may continue to use Hydrocodone as needed. Encouraged him to see Dr Jenne Pane as well.  6. Follow-up: on 1/29 as scheduled.   The length of time of the face-to-face encounter was 30 minutes. More than 50% of time was spent counseling and coordination of care.

## 2012-04-24 NOTE — Telephone Encounter (Signed)
Call from pt's daughter asking if pt can be seen today instead of tomorrow as scheduled.  His jaw pain is not relieved w/ the vicodin and he esp has pain w/ eating.  Per Dr. Gaylyn Rong pt to go to ED to assess jaw pain, if he cannot tolerate the pain.  We do not have any availability to see pt in clinic today.  Called dau back and explained no appts avail here today and for pt to go to ED if unable to make it until tomorrow.  Explained Dr. Gaylyn Rong says unsure as to cause of pt's pain and he may need to be worked up in the ED.  She verbalized understanding.  Asked her to call us to update [place] if pt goes to ED. She verbalized understanding.

## 2012-04-24 NOTE — Telephone Encounter (Signed)
Cancelled pt appt for 1/17th lab and ML

## 2012-04-25 ENCOUNTER — Ambulatory Visit: Payer: Medicare Other | Admitting: Oncology

## 2012-04-25 ENCOUNTER — Other Ambulatory Visit: Payer: Medicare Other | Admitting: Lab

## 2012-05-07 ENCOUNTER — Telehealth: Payer: Self-pay | Admitting: Oncology

## 2012-05-07 ENCOUNTER — Telehealth: Payer: Self-pay | Admitting: *Deleted

## 2012-05-07 ENCOUNTER — Other Ambulatory Visit (HOSPITAL_BASED_OUTPATIENT_CLINIC_OR_DEPARTMENT_OTHER): Payer: Medicare Other

## 2012-05-07 ENCOUNTER — Ambulatory Visit (HOSPITAL_BASED_OUTPATIENT_CLINIC_OR_DEPARTMENT_OTHER): Payer: Medicare Other | Admitting: Oncology

## 2012-05-07 ENCOUNTER — Encounter: Payer: Self-pay | Admitting: Oncology

## 2012-05-07 VITALS — BP 115/77 | HR 87 | Temp 96.9°F | Resp 18 | Ht 71.0 in | Wt 147.8 lb

## 2012-05-07 DIAGNOSIS — C8291 Follicular lymphoma, unspecified, lymph nodes of head, face, and neck: Secondary | ICD-10-CM

## 2012-05-07 DIAGNOSIS — C8591 Non-Hodgkin lymphoma, unspecified, lymph nodes of head, face, and neck: Secondary | ICD-10-CM

## 2012-05-07 DIAGNOSIS — C189 Malignant neoplasm of colon, unspecified: Secondary | ICD-10-CM

## 2012-05-07 DIAGNOSIS — C679 Malignant neoplasm of bladder, unspecified: Secondary | ICD-10-CM

## 2012-05-07 LAB — COMPREHENSIVE METABOLIC PANEL (CC13)
ALT: 11 U/L (ref 0–55)
AST: 11 U/L (ref 5–34)
Alkaline Phosphatase: 69 U/L (ref 40–150)
Calcium: 9 mg/dL (ref 8.4–10.4)
Chloride: 107 mEq/L (ref 98–107)
Creatinine: 1 mg/dL (ref 0.7–1.3)
Total Bilirubin: 0.32 mg/dL (ref 0.20–1.20)

## 2012-05-07 LAB — CBC WITH DIFFERENTIAL/PLATELET
BASO%: 0.5 % (ref 0.0–2.0)
EOS%: 0.3 % (ref 0.0–7.0)
HCT: 36.3 % — ABNORMAL LOW (ref 38.4–49.9)
MCH: 32.6 pg (ref 27.2–33.4)
MCHC: 36.5 g/dL — ABNORMAL HIGH (ref 32.0–36.0)
NEUT%: 73.3 % (ref 39.0–75.0)
lymph#: 0.7 10*3/uL — ABNORMAL LOW (ref 0.9–3.3)

## 2012-05-07 NOTE — Patient Instructions (Addendum)
You will come to [facility] Radiology @ 930 am to register. Procedure is at 10 am and takes about 1 hour. You will be in short stay for 4 hours (11-3) for recovery.   You may have a light breakfast.

## 2012-05-07 NOTE — Telephone Encounter (Signed)
Per scheduler I have adjusted appts.   JMW  

## 2012-05-07 NOTE — Progress Notes (Signed)
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   PAST THERAPY:  Biopsy only.   CURRENT THERAPY:  Due to start chemo R-CHOP today.  Goal is for 4 cycles with.  Plan is also to start intrathecal chemo with cycles 2-4.  Plan is also for consolidative radiation after finish of chemo.   INTERVAL HISTORY: Kenneth Reed 67 y.o. male returns for regular follow up. Jaw/pain has now resolved. Did not see ENT after his last visit. Denies chest pain, shortness of breath, abdominal pain, nausea, vomiting. No bleeding noted.  Patient denies fever, anorexia, weight loss, fatigue, headache, visual changes, confusion, drenching night sweats, palpable lymph node swelling, mucositis, odynophagia, dysphagia, nausea vomiting, jaundice, chest pain, palpitation, shortness of breath, dyspnea on exertion, productive cough, gum bleeding, epistaxis, hematemesis, hemoptysis, abdominal pain, abdominal swelling, early satiety, melena, hematochezia, hematuria, skin rash, spontaneous bleeding, joint swelling, joint pain, heat or cold intolerance, bowel bladder incontinence, back pain, focal motor weakness, paresthesia, depression, suicidal or homicidal ideation, feeling hopelessness.   Past Medical History  Diagnosis Date  . Non-Hodgkin's lymphoma of head   . Fatigue   . Colon cancer 2010    Dr. Gabriel Cirri operated; stage II.  No need for chemo.  . Bladder cancer 2010    Dr. Rito Ehrlich operated; no need for chemo; recurred locally  in 2013 locally.    Marland Kitchen BPH (benign prostatic hyperplasia)     Past Surgical History  Procedure Date  . Nasal polyp surgery 1983  . Colectomy 2010  . Bladder surgery 2010  . Colonoscopy 2012    next one 2014.     Current Outpatient Prescriptions  Medication Sig Dispense Refill  . albuterol (PROVENTIL HFA;VENTOLIN  HFA) 108 (90 BASE) MCG/ACT inhaler Inhale 2 puffs into the lungs every 4 (four) hours as needed. For shortness of breath.      . finasteride (PROSCAR) 5 MG tablet Take 5 mg by mouth at bedtime.       . Fluticasone-Salmeterol (ADVAIR) 250-50 MCG/DOSE AEPB Inhale 1 puff into the lungs every 12 (twelve) hours.      Marland Kitchen HYDROcodone-acetaminophen (NORCO) 5-325 MG per tablet Take 1 tablet by mouth every 6 (six) hours as needed for pain.  30 tablet  0  . lidocaine-prilocaine (EMLA) cream Apply 1 application topically daily as needed. Applies to port-a-cath site one hour prior to needle stick.      Marland Kitchen ondansetron (ZOFRAN) 8 MG tablet Take 8 mg by mouth 2 (two) times daily. He is to take two times a day starting the day after chemo for 3 days. Then take two times a day as needed for nausea or vomiting.      . predniSONE (DELTASONE) 20 MG tablet Take 40 mg/m2 by mouth daily. He is to take on days 1-5 of chemotherapy.      . prochlorperazine (COMPAZINE) 10 MG tablet Take 10 mg by mouth every 6 (six) hours as needed. For nausea.        ALLERGIES:   has no known allergies.  REVIEW OF SYSTEMS:  The rest of the 14-point review of system was negative.   Filed Vitals:   05/07/12 0919  BP: 115/77  Pulse: 87  Temp: 96.9 F (36.1 C)  Resp: 18   Wt Readings from Last 3 Encounters:  05/07/12 147 lb  12.8 oz (67.042 kg)  04/24/12 148 lb 12.8 oz (67.495 kg)  04/17/12 151 lb (68.493 kg)   ECOG Performance status: 0  PHYSICAL EXAMINATION:   General:  Thin-appearing man, in no acute distress.  Eyes:  no scleral icterus.  ENT:  No mass noted on left tonsil.  Neck was without thyromegaly.  Lymphatics:  Negative cervical, supraclavicular or axillary adenopathy.  Respiratory: lungs were clear bilaterally without wheezing or crackles.  Cardiovascular:  Regular rate and rhythm, S1/S2, without murmur, rub or gallop.  There was no pedal edema.  GI:  abdomen was soft, flat, nontender, nondistended, without organomegaly.   Muscoloskeletal:  no spinal tenderness of palpation of vertebral spine.  Skin exam was without echymosis, petichae.  Neuro exam was nonfocal.  Patient was able to get on and off exam table without assistance.  Gait was normal.  Patient was alerted and oriented.  Attention was good.   Language was appropriate.  Mood was normal without depression.  Speech was not pressured.  Thought content was not tangential.      LABORATORY/RADIOLOGY DATA:  Lab Results  Component Value Date   WBC 4.3 05/07/2012   HGB 13.3 05/07/2012   HCT 36.3* 05/07/2012   PLT 356 05/07/2012   GLUCOSE 103* 05/07/2012   ALKPHOS 69 05/07/2012   ALT 11 05/07/2012   AST 11 05/07/2012   NA 142 05/07/2012   K 3.8 05/07/2012   CL 107 05/07/2012   CREATININE 1.0 05/07/2012   BUN 11.0 05/07/2012   CO2 26 05/07/2012   INR 0.97 04/15/2012     ASSESSMENT AND PLAN:   1. History of stage II colon cancer; s/p colectomy in 2010. He is supposedly in remission. His last colonoscopy in 2012 was reportedly negative but is due for one more in 2014.  2. History of bladder cancer in 2010; with local recurrence in 2013.    3. High grade follicular lymphoma of right tonsil; IPI of 1 (or low risk)  - Stage: I.  PET scan did not show disease elsewhere.  Bone marrow biopsy did not show lymphoma. - Prognosis:  Chance of cure is about 80-90%.  - Treatment:  4 cycles of chemo R-CHOP.  Intrathecal chemo methotrexate with cycles 2,3, and 4 to decrease risk of lymphoma in the brain in the future. I wanted to skip intrathecal chemo with the fist cycle of R-CHOP to ensure that he tolerates R-CHOP well.   Radiation after chemo is finished to decrease risk of recurrence.  -Chemotherapy: Recommend that he proceed with cycle 2 of R-CHOP on 05/09/12 without dose modification. IT chemo is scheduled on 05/12/12.  - To assess response to chemo, a repeat scan will be obtained after 4 cycles of chemo.  We can assess clinical response with clinical exam during chemo.    4.  Nausea/vomiting:  He has Compazine/Zofran prn.   5. Jaw pain. Now resolved. He may use Hydrocodone as needed. If jaw pain recurs, will need to follow-up with ENT.     The length of time of the face-to-face encounter was 15 minutes. More than 50% of time was spent counseling and coordination of care.

## 2012-05-07 NOTE — Telephone Encounter (Signed)
gv and printed pt appt schedule for Jan thru March.Marland KitchenMarland KitchenSw and mailed Kenneth Reed to add tx..the patient aware..Dr Gaylyn Rong no availability on 2.21.14

## 2012-05-09 ENCOUNTER — Other Ambulatory Visit: Payer: Self-pay | Admitting: Oncology

## 2012-05-09 ENCOUNTER — Ambulatory Visit (HOSPITAL_BASED_OUTPATIENT_CLINIC_OR_DEPARTMENT_OTHER): Payer: Medicare Other

## 2012-05-09 VITALS — BP 107/72 | HR 77 | Temp 97.7°F | Resp 20

## 2012-05-09 DIAGNOSIS — C8291 Follicular lymphoma, unspecified, lymph nodes of head, face, and neck: Secondary | ICD-10-CM

## 2012-05-09 DIAGNOSIS — Z5112 Encounter for antineoplastic immunotherapy: Secondary | ICD-10-CM

## 2012-05-09 DIAGNOSIS — C8591 Non-Hodgkin lymphoma, unspecified, lymph nodes of head, face, and neck: Secondary | ICD-10-CM

## 2012-05-09 MED ORDER — SODIUM CHLORIDE 0.9 % IV SOLN
750.0000 mg/m2 | Freq: Once | INTRAVENOUS | Status: AC
  Administered 2012-05-09: 1400 mg via INTRAVENOUS
  Filled 2012-05-09: qty 70

## 2012-05-09 MED ORDER — VINCRISTINE SULFATE CHEMO INJECTION 1 MG/ML
2.0000 mg | Freq: Once | INTRAVENOUS | Status: AC
  Administered 2012-05-09: 2 mg via INTRAVENOUS
  Filled 2012-05-09: qty 2

## 2012-05-09 MED ORDER — DIPHENHYDRAMINE HCL 25 MG PO CAPS
50.0000 mg | ORAL_CAPSULE | Freq: Once | ORAL | Status: AC
  Administered 2012-05-09: 50 mg via ORAL

## 2012-05-09 MED ORDER — SODIUM CHLORIDE 0.9 % IV SOLN: 375.0000 mg/m2 | Freq: Once | INTRAVENOUS | Status: DC

## 2012-05-09 MED ORDER — DEXAMETHASONE SODIUM PHOSPHATE 4 MG/ML IJ SOLN
20.0000 mg | Freq: Once | INTRAMUSCULAR | Status: AC
  Administered 2012-05-09: 20 mg via INTRAVENOUS

## 2012-05-09 MED ORDER — HEPARIN SOD (PORK) LOCK FLUSH 100 UNIT/ML IV SOLN
500.0000 [IU] | Freq: Once | INTRAVENOUS | Status: AC | PRN
  Administered 2012-05-09: 500 [IU]
  Filled 2012-05-09: qty 5

## 2012-05-09 MED ORDER — SODIUM CHLORIDE 0.9 % IJ SOLN
10.0000 mL | INTRAMUSCULAR | Status: DC | PRN
  Administered 2012-05-09: 10 mL
  Filled 2012-05-09: qty 10

## 2012-05-09 MED ORDER — ONDANSETRON 16 MG/50ML IVPB (CHCC)
16.0000 mg | Freq: Once | INTRAVENOUS | Status: AC
  Administered 2012-05-09: 16 mg via INTRAVENOUS

## 2012-05-09 MED ORDER — DOXORUBICIN HCL CHEMO IV INJECTION 2 MG/ML
50.0000 mg/m2 | Freq: Once | INTRAVENOUS | Status: AC
  Administered 2012-05-09: 94 mg via INTRAVENOUS
  Filled 2012-05-09: qty 47

## 2012-05-09 MED ORDER — SODIUM CHLORIDE 0.9 % IV SOLN
Freq: Once | INTRAVENOUS | Status: AC
  Administered 2012-05-09: 10:00:00 via INTRAVENOUS

## 2012-05-09 MED ORDER — ACETAMINOPHEN 325 MG PO TABS
650.0000 mg | ORAL_TABLET | Freq: Once | ORAL | Status: AC
  Administered 2012-05-09: 650 mg via ORAL

## 2012-05-09 MED ORDER — SODIUM CHLORIDE 0.9 % IV SOLN
375.0000 mg/m2 | Freq: Once | INTRAVENOUS | Status: AC
  Administered 2012-05-09: 700 mg via INTRAVENOUS
  Filled 2012-05-09: qty 70

## 2012-05-09 NOTE — Patient Instructions (Addendum)
[facility] Discharge Instructions for Patients Receiving Chemotherapy  Today you received the following chemotherapy agents :  Rituxan,  Adriamycin,  Vincristine,  Cytoxan.  To help prevent nausea and vomiting after your treatment, we encourage you to take your nausea medication as instructed by your physician.    If you develop nausea and vomiting that is not controlled by your nausea medication, call the clinic. If it is after clinic hours your family physician or the after hours number for the clinic or go to the Emergency Department.   BELOW ARE SYMPTOMS THAT SHOULD BE REPORTED IMMEDIATELY:  *FEVER GREATER THAN 100.5 F  *CHILLS WITH OR WITHOUT FEVER  NAUSEA AND VOMITING THAT IS NOT CONTROLLED WITH YOUR NAUSEA MEDICATION  *UNUSUAL SHORTNESS OF BREATH  *UNUSUAL BRUISING OR BLEEDING  TENDERNESS IN MOUTH AND THROAT WITH OR WITHOUT PRESENCE OF ULCERS  *URINARY PROBLEMS  *BOWEL PROBLEMS  UNUSUAL RASH Items with * indicate a potential emergency and should be followed up as soon as possible.  One of the nurses will contact you 24 hours after your treatment. Please let the nurse know about any problems that you may have experienced. Feel free to call the clinic you have any questions or concerns. The clinic phone number is [phone].   I have been informed and understand all the instructions given to me. I know to contact the clinic, my physician, or go to the Emergency Department if any problems should occur. I do not have any questions at this time, but understand that I may call the clinic during office hours   should I have any questions or need assistance in obtaining follow up care.    __________________________________________  _____________  __________ Signature of Patient or Authorized Representative            Date                   Time    __________________________________________ Nurse's Signature    

## 2012-05-10 ENCOUNTER — Ambulatory Visit (HOSPITAL_BASED_OUTPATIENT_CLINIC_OR_DEPARTMENT_OTHER): Payer: Medicare Other

## 2012-05-10 VITALS — BP 121/74 | HR 67 | Temp 98.4°F

## 2012-05-10 DIAGNOSIS — C8291 Follicular lymphoma, unspecified, lymph nodes of head, face, and neck: Secondary | ICD-10-CM

## 2012-05-10 DIAGNOSIS — C8591 Non-Hodgkin lymphoma, unspecified, lymph nodes of head, face, and neck: Secondary | ICD-10-CM

## 2012-05-10 MED ORDER — PEGFILGRASTIM INJECTION 6 MG/0.6ML
6.0000 mg | Freq: Once | SUBCUTANEOUS | Status: AC
  Administered 2012-05-10: 6 mg via SUBCUTANEOUS

## 2012-05-12 ENCOUNTER — Ambulatory Visit (HOSPITAL_COMMUNITY)
Admission: RE | Admit: 2012-05-12 | Discharge: 2012-05-12 | Disposition: A | Discharge status: Home / Self Care | Payer: Medicare Other | Source: Ambulatory Visit | Attending: Oncology | Admitting: Oncology

## 2012-05-12 ENCOUNTER — Other Ambulatory Visit (HOSPITAL_COMMUNITY): Payer: Self-pay | Admitting: Radiology

## 2012-05-12 DIAGNOSIS — C8589 Other specified types of non-Hodgkin lymphoma, extranodal and solid organ sites: Secondary | ICD-10-CM | POA: Insufficient documentation

## 2012-05-12 DIAGNOSIS — C8591 Non-Hodgkin lymphoma, unspecified, lymph nodes of head, face, and neck: Secondary | ICD-10-CM

## 2012-05-12 MED ORDER — SODIUM CHLORIDE 0.9 % IJ SOLN
Freq: Once | INTRAMUSCULAR | Status: AC
  Administered 2012-05-12: 11:00:00 via INTRATHECAL
  Filled 2012-05-12: qty 0.48

## 2012-05-12 NOTE — Procedures (Signed)
  FLUOROSCOPICALLY GUIDED LUMBAR PUNCTURE FOR INTRATHECAL CHEMOTHERAPY  Technique:  Informed consent was obtained from the patient prior to the procedure, including potential complications of headache, allergy, and pain.   With the patient prone, the lower back was prepped with Betadine.  1% Lidocaine was used for local anesthesia.  Lumbar puncture was performed at the [L3-4] level using a [20] gauge needle with return of [clear] CSF. Per the referring physician request, approximately 11 ml of CSF were obtained in four vials for laboratory evaluation.  [5 ml]  of  [methotrexate] was injected into the subarachnoid space.  The patient tolerated the procedure well without apparent complication. IMPRESSION: [Successful fluoroscopically guided lumbar puncture and methotrexate injection.

## 2012-05-13 ENCOUNTER — Other Ambulatory Visit: Payer: Self-pay | Admitting: Certified Registered Nurse Anesthetist

## 2012-05-14 ENCOUNTER — Other Ambulatory Visit: Payer: Self-pay | Admitting: Certified Registered Nurse Anesthetist

## 2012-05-24 ENCOUNTER — Other Ambulatory Visit: Payer: Self-pay

## 2012-05-27 ENCOUNTER — Other Ambulatory Visit: Payer: Self-pay | Admitting: Oncology

## 2012-05-27 DIAGNOSIS — C8591 Non-Hodgkin lymphoma, unspecified, lymph nodes of head, face, and neck: Secondary | ICD-10-CM

## 2012-05-29 ENCOUNTER — Telehealth: Payer: Self-pay | Admitting: Oncology

## 2012-05-29 ENCOUNTER — Telehealth: Payer: Self-pay | Admitting: *Deleted

## 2012-05-29 NOTE — Telephone Encounter (Signed)
Pt states he is returning call to [facility] about a lumbar puncture scheduled for Monday.  Informed pt that appt has not been scheduled yet, but someone in IR should be contacting him for appt. Informed there is an order in computer for this.  If he does not hear anything today he will let us know on his office visit tomorrow morning.

## 2012-05-29 NOTE — Telephone Encounter (Signed)
I entered order for LP 2 days ago. I suspect the call that he received from was scheduling. Did they leave a message for him with a number to return call?

## 2012-05-29 NOTE — Telephone Encounter (Signed)
I left VM for Scheduler and IR to return pt's call to schedule this.

## 2012-05-29 NOTE — Telephone Encounter (Signed)
s.w. pt and advised on appt d/t change.Marland KitchenMarland Kitchenpt will come by at appt tomorrow with [facility]

## 2012-05-30 ENCOUNTER — Ambulatory Visit (HOSPITAL_BASED_OUTPATIENT_CLINIC_OR_DEPARTMENT_OTHER): Payer: Medicare Other | Admitting: Oncology

## 2012-05-30 ENCOUNTER — Telehealth: Payer: Self-pay | Admitting: Oncology

## 2012-05-30 ENCOUNTER — Ambulatory Visit (HOSPITAL_BASED_OUTPATIENT_CLINIC_OR_DEPARTMENT_OTHER): Payer: Medicare Other

## 2012-05-30 ENCOUNTER — Other Ambulatory Visit (HOSPITAL_BASED_OUTPATIENT_CLINIC_OR_DEPARTMENT_OTHER): Payer: Medicare Other | Admitting: Lab

## 2012-05-30 VITALS — BP 117/74 | HR 79 | Temp 97.5°F | Resp 18 | Ht 71.0 in | Wt 147.2 lb

## 2012-05-30 VITALS — BP 109/56 | HR 80 | Temp 97.8°F | Resp 18

## 2012-05-30 DIAGNOSIS — C8291 Follicular lymphoma, unspecified, lymph nodes of head, face, and neck: Secondary | ICD-10-CM

## 2012-05-30 DIAGNOSIS — C189 Malignant neoplasm of colon, unspecified: Secondary | ICD-10-CM

## 2012-05-30 DIAGNOSIS — C8591 Non-Hodgkin lymphoma, unspecified, lymph nodes of head, face, and neck: Secondary | ICD-10-CM

## 2012-05-30 DIAGNOSIS — C679 Malignant neoplasm of bladder, unspecified: Secondary | ICD-10-CM

## 2012-05-30 DIAGNOSIS — Z5112 Encounter for antineoplastic immunotherapy: Secondary | ICD-10-CM

## 2012-05-30 DIAGNOSIS — R112 Nausea with vomiting, unspecified: Secondary | ICD-10-CM

## 2012-05-30 DIAGNOSIS — Z5111 Encounter for antineoplastic chemotherapy: Secondary | ICD-10-CM

## 2012-05-30 LAB — CBC WITH DIFFERENTIAL/PLATELET
BASO%: 0.7 % (ref 0.0–2.0)
EOS%: 0.3 % (ref 0.0–7.0)
HCT: 35.3 % — ABNORMAL LOW (ref 38.4–49.9)
LYMPH%: 12.9 % — ABNORMAL LOW (ref 14.0–49.0)
MCH: 32.4 pg (ref 27.2–33.4)
MCHC: 35.8 g/dL (ref 32.0–36.0)
MCV: 90.5 fL (ref 79.3–98.0)
MONO%: 7.6 % (ref 0.0–14.0)
NEUT%: 78.5 % — ABNORMAL HIGH (ref 39.0–75.0)
lymph#: 0.9 10*3/uL (ref 0.9–3.3)

## 2012-05-30 LAB — COMPREHENSIVE METABOLIC PANEL (CC13)
ALT: 16 U/L (ref 0–55)
AST: 14 U/L (ref 5–34)
Alkaline Phosphatase: 72 U/L (ref 40–150)
Chloride: 106 mEq/L (ref 98–107)
Creatinine: 0.9 mg/dL (ref 0.7–1.3)
Total Bilirubin: 0.44 mg/dL (ref 0.20–1.20)

## 2012-05-30 MED ORDER — ACETAMINOPHEN 325 MG PO TABS
650.0000 mg | ORAL_TABLET | Freq: Once | ORAL | Status: AC
  Administered 2012-05-30: 650 mg via ORAL

## 2012-05-30 MED ORDER — HYDROCODONE-ACETAMINOPHEN 5-325 MG PO TABS: 1.0000 | ORAL_TABLET | Freq: Four times a day (QID) | ORAL | Status: DC | PRN

## 2012-05-30 MED ORDER — VINCRISTINE SULFATE CHEMO INJECTION 1 MG/ML
2.0000 mg | Freq: Once | INTRAVENOUS | Status: AC
  Administered 2012-05-30: 2 mg via INTRAVENOUS
  Filled 2012-05-30: qty 2

## 2012-05-30 MED ORDER — HEPARIN SOD (PORK) LOCK FLUSH 100 UNIT/ML IV SOLN
500.0000 [IU] | Freq: Once | INTRAVENOUS | Status: AC | PRN
  Administered 2012-05-30: 500 [IU]
  Filled 2012-05-30: qty 5

## 2012-05-30 MED ORDER — SODIUM CHLORIDE 0.9 % IJ SOLN
10.0000 mL | INTRAMUSCULAR | Status: DC | PRN
  Administered 2012-05-30: 10 mL
  Filled 2012-05-30: qty 10

## 2012-05-30 MED ORDER — SODIUM CHLORIDE 0.9 % IV SOLN
Freq: Once | INTRAVENOUS | Status: AC
  Administered 2012-05-30: 10:00:00 via INTRAVENOUS

## 2012-05-30 MED ORDER — DIPHENHYDRAMINE HCL 25 MG PO CAPS
50.0000 mg | ORAL_CAPSULE | Freq: Once | ORAL | Status: AC
  Administered 2012-05-30: 50 mg via ORAL

## 2012-05-30 MED ORDER — SODIUM CHLORIDE 0.9 % IV SOLN
375.0000 mg/m2 | Freq: Once | INTRAVENOUS | Status: AC
  Administered 2012-05-30: 700 mg via INTRAVENOUS
  Filled 2012-05-30: qty 70

## 2012-05-30 MED ORDER — DOXORUBICIN HCL CHEMO IV INJECTION 2 MG/ML
50.0000 mg/m2 | Freq: Once | INTRAVENOUS | Status: AC
  Administered 2012-05-30: 94 mg via INTRAVENOUS
  Filled 2012-05-30: qty 47

## 2012-05-30 MED ORDER — SODIUM CHLORIDE 0.9 % IV SOLN
750.0000 mg/m2 | Freq: Once | INTRAVENOUS | Status: AC
  Administered 2012-05-30: 1400 mg via INTRAVENOUS
  Filled 2012-05-30: qty 70

## 2012-05-30 MED ORDER — DEXAMETHASONE SODIUM PHOSPHATE 4 MG/ML IJ SOLN
20.0000 mg | Freq: Once | INTRAMUSCULAR | Status: AC
  Administered 2012-05-30: 20 mg via INTRAVENOUS

## 2012-05-30 MED ORDER — ONDANSETRON 16 MG/50ML IVPB (CHCC)
16.0000 mg | Freq: Once | INTRAVENOUS | Status: AC
  Administered 2012-05-30: 16 mg via INTRAVENOUS

## 2012-05-30 NOTE — Progress Notes (Signed)
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   PAST THERAPY:  Biopsy only.   CURRENT THERAPY:  Due to start chemo R-CHOP today.  Goal is for 4 cycles with.  Plan is also to start intrathecal chemo with cycles 2-4.  Plan is also for consolidative radiation after finish of chemo.   INTERVAL HISTORY: Kenneth Reed 67 y.o. male returns for regular follow up. Jaw/pain has now resolved. Denies chest pain, shortness of breath, abdominal pain, nausea, vomiting. No bleeding noted. No difficulty swallowing. Has had intermittent headaches in the morning since LP. Uses Tylenol or Vicodin with relief. No blurred vision or dizziness.  Patient denies fever, anorexia, weight loss, fatigue, headache, visual changes, confusion, drenching night sweats, palpable lymph node swelling, mucositis, odynophagia, dysphagia, nausea vomiting, jaundice, chest pain, palpitation, shortness of breath, dyspnea on exertion, productive cough, gum bleeding, epistaxis, hematemesis, hemoptysis, abdominal pain, abdominal swelling, early satiety, melena, hematochezia, hematuria, skin rash, spontaneous bleeding, joint swelling, joint pain, heat or cold intolerance, bowel bladder incontinence, back pain, focal motor weakness, paresthesia, depression, suicidal or homicidal ideation, feeling hopelessness.   Past Medical History  Diagnosis Date  . Non-Hodgkin's lymphoma of head   . Fatigue   . Colon cancer 2010    Dr. Gabriel Cirri operated; stage II.  No need for chemo.  . Bladder cancer 2010    Dr. Rito Ehrlich operated; no need for chemo; recurred locally  in 2013 locally.    Marland Kitchen BPH (benign prostatic hyperplasia)     Past Surgical History  Procedure Laterality Date  . Nasal polyp surgery  1983  . Colectomy  2010  . Bladder surgery  2010  . Colonoscopy  2012     next one 2014.     Current Outpatient Prescriptions  Medication Sig Dispense Refill  . albuterol (PROVENTIL HFA;VENTOLIN HFA) 108 (90 BASE) MCG/ACT inhaler Inhale 2 puffs into the lungs every 4 (four) hours as needed. For shortness of breath.      . finasteride (PROSCAR) 5 MG tablet Take 5 mg by mouth at bedtime.       . Fluticasone-Salmeterol (ADVAIR) 250-50 MCG/DOSE AEPB Inhale 1 puff into the lungs every 12 (twelve) hours.      Marland Kitchen HYDROcodone-acetaminophen (NORCO) 5-325 MG per tablet Take 1 tablet by mouth every 6 (six) hours as needed for pain.  30 tablet  0  . lidocaine-prilocaine (EMLA) cream Apply 1 application topically daily as needed. Applies to port-a-cath site one hour prior to needle stick.      Marland Kitchen ondansetron (ZOFRAN) 8 MG tablet Take 8 mg by mouth 2 (two) times daily. He is to take two times a day starting the day after chemo for 3 days. Then take two times a day as needed for nausea or vomiting.      . predniSONE (DELTASONE) 20 MG tablet Take 40 mg/m2 by mouth daily. He is to take on days 1-5 of chemotherapy.      . prochlorperazine (COMPAZINE) 10 MG tablet Take 10 mg by mouth every 6 (six) hours as needed. For nausea.       No current facility-administered medications for this visit.   Facility-Administered Medications Ordered in Other Visits  Medication Dose Route Frequency Provider Last Rate Last Dose  . acetaminophen (TYLENOL) tablet 650 mg  650 mg Oral Once [employer]  Margaretmary Bayley, MD      . cyclophosphamide (CYTOXAN) 1,400 mg in sodium chloride 0.9 % 250 mL chemo infusion  750 mg/m2 (Treatment Plan Actual) Intravenous Once Exie Parody, MD      . DOXOrubicin (ADRIAMYCIN) chemo injection 94 mg  50 mg/m2 (Treatment Plan Actual) Intravenous Once Exie Parody, MD      . heparin lock flush 100 unit/mL  500 Units Intracatheter Once PRN Exie Parody, MD      . riTUXimab (RITUXAN) 700 mg in sodium chloride 0.9 % 180 mL chemo infusion  375 mg/m2 (Treatment Plan Actual) Intravenous Once Exie Parody, MD       . sodium chloride 0.9 % injection 10 mL  10 mL Intracatheter PRN Exie Parody, MD      . vinCRIStine (ONCOVIN) 2 mg in sodium chloride 0.9 % 50 mL chemo infusion  2 mg Intravenous Once Exie Parody, MD        ALLERGIES:  has No Known Allergies.  REVIEW OF SYSTEMS:  The rest of the 14-point review of system was negative.   Filed Vitals:   05/30/12 0907  BP: 117/74  Pulse: 79  Temp: 97.5 F (36.4 C)  Resp: 18   Wt Readings from Last 3 Encounters:  05/30/12 147 lb 3.2 oz (66.769 kg)  05/07/12 147 lb 12.8 oz (67.042 kg)  04/24/12 148 lb 12.8 oz (67.495 kg)   ECOG Performance status: 0  PHYSICAL EXAMINATION:   General:  Thin-appearing man, in no acute distress.  Eyes:  no scleral icterus.  ENT:  No mass noted on left tonsil.  Neck was without thyromegaly.  Lymphatics:  Negative cervical, supraclavicular or axillary adenopathy.  Respiratory: lungs were clear bilaterally without wheezing or crackles.  Cardiovascular:  Regular rate and rhythm, S1/S2, without murmur, rub or gallop.  There was no pedal edema.  GI:  abdomen was soft, flat, nontender, nondistended, without organomegaly.  Muscoloskeletal:  no spinal tenderness of palpation of vertebral spine.  Skin exam was without echymosis, petichae.  Neuro exam was nonfocal.  Patient was able to get on and off exam table without assistance.  Gait was normal.  Patient was alerted and oriented.  Attention was good.   Language was appropriate.  Mood was normal without depression.  Speech was not pressured.  Thought content was not tangential.      LABORATORY/RADIOLOGY DATA:  Lab Results  Component Value Date   WBC 6.6 05/30/2012   HGB 12.6* 05/30/2012   HCT 35.3* 05/30/2012   PLT 241 05/30/2012   GLUCOSE 100* 05/30/2012   ALKPHOS 72 05/30/2012   ALT 16 05/30/2012   AST 14 05/30/2012   NA 140 05/30/2012   K 3.6 05/30/2012   CL 106 05/30/2012   CREATININE 0.9 05/30/2012   BUN 7.9 05/30/2012   CO2 26 05/30/2012   INR 0.97 04/15/2012     ASSESSMENT AND  PLAN:   1. History of stage II colon cancer; s/p colectomy in 2010. He is supposedly in remission. His last colonoscopy in 2012 was reportedly negative but is due for one more in 2014.  2. History of bladder cancer in 2010; with local recurrence in 2013.    3. High grade follicular lymphoma of right tonsil; IPI of 1 (or low risk)  - Stage: I.  PET scan did not show disease elsewhere.  Bone marrow biopsy did not show lymphoma. - Prognosis:  Chance of cure is about 80-90%.  - Treatment:  4 cycles  of chemo R-CHOP.  Intrathecal chemo methotrexate with cycles 2,3, and 4 to decrease risk of lymphoma in the brain in the future. I wanted to skip intrathecal chemo with the fist cycle of R-CHOP to ensure that he tolerates R-CHOP well.   Radiation after chemo is finished to decrease risk of recurrence.  -Chemotherapy: Recommend that he proceed with cycle 3 of R-CHOP on 05/09/12 without dose modification. IT chemo is scheduled on 06/02/12.  - To assess response to chemo, a repeat scan will be obtained after 4 cycles of chemo.  We can assess clinical response with clinical exam during chemo.    4. Nausea/vomiting:  He has Compazine/Zofran prn.   5. Jaw pain. Now resolved. He may use Hydrocodone as needed. If jaw pain recurs, will need to follow-up with ENT.  6. Follow-up: in 3 weeks prior to cycle 4.   The length of time of the face-to-face encounter was 15 minutes. More than 50% of time was spent counseling and coordination of care.

## 2012-05-30 NOTE — Telephone Encounter (Signed)
gv and printed appt schedule to pt for Feb and March

## 2012-05-30 NOTE — Patient Instructions (Addendum)
[facility] Discharge Instructions for Patients Receiving Chemotherapy  Today you received the following chemotherapy agents Adriamycin, Vincristine, Cytoxan and Rituxan.  To help prevent nausea and vomiting after your treatment, we encourage you to take your nausea medication.  If you develop nausea and vomiting that is not controlled by your nausea medication, call the clinic. If it is after clinic hours your family physician or the after hours number for the clinic or go to the Emergency Department.   BELOW ARE SYMPTOMS THAT SHOULD BE REPORTED IMMEDIATELY:  *FEVER GREATER THAN 100.5 F  *CHILLS WITH OR WITHOUT FEVER  NAUSEA AND VOMITING THAT IS NOT CONTROLLED WITH YOUR NAUSEA MEDICATION  *UNUSUAL SHORTNESS OF BREATH  *UNUSUAL BRUISING OR BLEEDING  TENDERNESS IN MOUTH AND THROAT WITH OR WITHOUT PRESENCE OF ULCERS  *URINARY PROBLEMS  *BOWEL PROBLEMS  UNUSUAL RASH Items with * indicate a potential emergency and should be followed up as soon as possible.  One of the nurses will contact you 24 hours after your treatment. Please let the nurse know about any problems that you may have experienced. Feel free to call the clinic you have any questions or concerns. The clinic phone number is [phone].   I have been informed and understand all the instructions given to me. I know to contact the clinic, my physician, or go to the Emergency Department if any problems should occur. I do not have any questions at this time, but understand that I may call the clinic during office hours   should I have any questions or need assistance in obtaining follow up care.    __________________________________________  _____________  __________ Signature of Patient or Authorized Representative            Date                   Time    __________________________________________ Nurse's Signature

## 2012-05-31 ENCOUNTER — Ambulatory Visit (HOSPITAL_BASED_OUTPATIENT_CLINIC_OR_DEPARTMENT_OTHER): Payer: Medicare Other

## 2012-05-31 VITALS — BP 113/55 | HR 73 | Temp 98.4°F | Resp 20

## 2012-05-31 DIAGNOSIS — C8581 Other specified types of non-Hodgkin lymphoma, lymph nodes of head, face, and neck: Secondary | ICD-10-CM

## 2012-05-31 DIAGNOSIS — C8591 Non-Hodgkin lymphoma, unspecified, lymph nodes of head, face, and neck: Secondary | ICD-10-CM

## 2012-05-31 MED ORDER — PEGFILGRASTIM INJECTION 6 MG/0.6ML
6.0000 mg | Freq: Once | SUBCUTANEOUS | Status: AC
  Administered 2012-05-31: 6 mg via SUBCUTANEOUS

## 2012-06-02 ENCOUNTER — Other Ambulatory Visit: Payer: Self-pay | Admitting: Oncology

## 2012-06-02 ENCOUNTER — Encounter (HOSPITAL_COMMUNITY): Payer: Self-pay

## 2012-06-02 ENCOUNTER — Ambulatory Visit (HOSPITAL_COMMUNITY)
Admission: RE | Admit: 2012-06-02 | Discharge: 2012-06-02 | Disposition: A | Discharge status: Home / Self Care | Payer: Medicare Other | Source: Ambulatory Visit | Attending: Oncology | Admitting: Oncology

## 2012-06-02 ENCOUNTER — Inpatient Hospital Stay (HOSPITAL_COMMUNITY)
Admission: RE | Admit: 2012-06-02 | Discharge: 2012-06-02 | Disposition: A | Discharge status: Home / Self Care | Payer: Medicare Other | Source: Ambulatory Visit

## 2012-06-02 ENCOUNTER — Other Ambulatory Visit: Payer: Self-pay | Admitting: Diagnostic Radiology

## 2012-06-02 DIAGNOSIS — C8591 Non-Hodgkin lymphoma, unspecified, lymph nodes of head, face, and neck: Secondary | ICD-10-CM

## 2012-06-02 DIAGNOSIS — C8589 Other specified types of non-Hodgkin lymphoma, extranodal and solid organ sites: Secondary | ICD-10-CM | POA: Insufficient documentation

## 2012-06-02 MED ORDER — METHOTREXATE SODIUM CHEMO INJECTION 25 MG/ML PF
Freq: Once | INTRAMUSCULAR | Status: AC
  Administered 2012-06-02: 11:00:00 via INTRATHECAL
  Filled 2012-06-02: qty 0.48

## 2012-06-02 MED ORDER — ACETAMINOPHEN 500 MG PO TABS: 1000.0000 mg | ORAL_TABLET | Freq: Four times a day (QID) | ORAL | Status: DC | PRN

## 2012-06-02 MED ORDER — DOXYCYCLINE HYCLATE 100 MG PO TABS: 100.0000 mg | ORAL_TABLET | Freq: Two times a day (BID) | ORAL | Status: DC

## 2012-06-02 NOTE — Progress Notes (Signed)
Patient states upon arrival to Short Stay he has a boil on back of head. Site is reddened with white center. He states Dr. Gaylyn Rong is aware. Instructed to call Dr. Gaylyn Rong again to note changes. Verbalizes understanding.

## 2012-06-04 ENCOUNTER — Other Ambulatory Visit: Payer: Self-pay | Admitting: Certified Registered Nurse Anesthetist

## 2012-06-06 ENCOUNTER — Other Ambulatory Visit: Payer: Self-pay | Admitting: Certified Registered Nurse Anesthetist

## 2012-06-11 ENCOUNTER — Encounter (HOSPITAL_COMMUNITY): Payer: Self-pay | Admitting: Pharmacy Technician

## 2012-06-18 ENCOUNTER — Other Ambulatory Visit (HOSPITAL_BASED_OUTPATIENT_CLINIC_OR_DEPARTMENT_OTHER): Payer: Medicare Other

## 2012-06-18 ENCOUNTER — Ambulatory Visit (HOSPITAL_BASED_OUTPATIENT_CLINIC_OR_DEPARTMENT_OTHER): Payer: Medicare Other | Admitting: Oncology

## 2012-06-18 ENCOUNTER — Telehealth: Payer: Self-pay | Admitting: Oncology

## 2012-06-18 VITALS — BP 110/70 | HR 83 | Temp 97.3°F | Resp 18 | Ht 71.0 in | Wt 145.4 lb

## 2012-06-18 DIAGNOSIS — C8591 Non-Hodgkin lymphoma, unspecified, lymph nodes of head, face, and neck: Secondary | ICD-10-CM

## 2012-06-18 DIAGNOSIS — C8581 Other specified types of non-Hodgkin lymphoma, lymph nodes of head, face, and neck: Secondary | ICD-10-CM

## 2012-06-18 DIAGNOSIS — C189 Malignant neoplasm of colon, unspecified: Secondary | ICD-10-CM

## 2012-06-18 DIAGNOSIS — C679 Malignant neoplasm of bladder, unspecified: Secondary | ICD-10-CM

## 2012-06-18 DIAGNOSIS — D649 Anemia, unspecified: Secondary | ICD-10-CM

## 2012-06-18 LAB — CBC WITH DIFFERENTIAL/PLATELET
Basophils Absolute: 0 10*3/uL (ref 0.0–0.1)
Eosinophils Absolute: 0 10*3/uL (ref 0.0–0.5)
HCT: 32.7 % — ABNORMAL LOW (ref 38.4–49.9)
HGB: 11.9 g/dL — ABNORMAL LOW (ref 13.0–17.1)
LYMPH%: 15.4 % (ref 14.0–49.0)
MCHC: 36.3 g/dL — ABNORMAL HIGH (ref 32.0–36.0)
MONO#: 0.5 10*3/uL (ref 0.1–0.9)
NEUT%: 72.8 % (ref 39.0–75.0)
Platelets: 206 10*3/uL (ref 140–400)
WBC: 4.5 10*3/uL (ref 4.0–10.3)
lymph#: 0.7 10*3/uL — ABNORMAL LOW (ref 0.9–3.3)

## 2012-06-18 LAB — COMPREHENSIVE METABOLIC PANEL (CC13)
ALT: 25 U/L (ref 0–55)
BUN: 9.7 mg/dL (ref 7.0–26.0)
CO2: 27 mEq/L (ref 22–29)
Calcium: 8.9 mg/dL (ref 8.4–10.4)
Chloride: 106 mEq/L (ref 98–107)
Creatinine: 0.9 mg/dL (ref 0.7–1.3)
Glucose: 95 mg/dl (ref 70–99)
Total Bilirubin: 0.59 mg/dL (ref 0.20–1.20)

## 2012-06-18 NOTE — Progress Notes (Signed)
[facility]  Telephone:(336) [phone] Fax:(336) [phone]   OFFICE PROGRESS NOTE   Cc:  Rudi Heap, MD  DIAGNOSIS: stage I, high grade follicular lymphoma of right tonsil; IPI score of 1 low low risk.   PAST THERAPY:  Biopsy only.   CURRENT THERAPY: started on 04/18/2012 R-CHOP today along with prophylactic intrathecal chemo.  Goal is for 4 cycles of chemotherapy followed by consolidative radiation.   INTERVAL HISTORY: Kenneth Reed 67 y.o. male returns for regular follow up with his sister. He reported feeling very well. He no longer has any dysphagia, odynophagia, mucositis, lymph node swelling, jaw pain.  He has been tolerating chemotherapy very well without fever, mucositis, nausea vomiting, neuropathy, edema, constipation, bleeding symptom. He does have mild fatigue; however, he is still independent of all activities of daily living.  The rest of the 14 point review of system was negative.   Past Medical History  Diagnosis Date  . Non-Hodgkin's lymphoma of head   . Fatigue   . Colon cancer 2010    Dr. Gabriel Cirri operated; stage II.  No need for chemo.  . Bladder cancer 2010    Dr. Rito Ehrlich operated; no need for chemo; recurred locally  in 2013 locally.    Marland Kitchen BPH (benign prostatic hyperplasia)     Past Surgical History  Procedure Laterality Date  . Nasal polyp surgery  1983  . Colectomy  2010  . Bladder surgery  2010  . Colonoscopy  2012    next one 2014.     Current Outpatient Prescriptions  Medication Sig Dispense Refill  . albuterol (PROVENTIL HFA;VENTOLIN HFA) 108 (90 BASE) MCG/ACT inhaler Inhale 2 puffs into the lungs every 4 (four) hours as needed. For shortness of breath.      . doxycycline (VIBRA-TABS) 100 MG tablet Take 1 tablet (100 mg total) by mouth 2 (two) times daily.  20 tablet  0  . finasteride (PROSCAR) 5 MG tablet Take 5 mg by mouth at bedtime.       . Fluticasone-Salmeterol (ADVAIR) 250-50 MCG/DOSE AEPB Inhale 1 puff into the lungs  every 12 (twelve) hours.      Marland Kitchen HYDROcodone-acetaminophen (NORCO) 5-325 MG per tablet Take 1 tablet by mouth every 6 (six) hours as needed for pain.  30 tablet  0  . lidocaine-prilocaine (EMLA) cream Apply 1 application topically daily as needed. Applies to port-a-cath site one hour prior to needle stick.      Marland Kitchen ondansetron (ZOFRAN) 8 MG tablet Take 8 mg by mouth 2 (two) times daily. He is to take two times a day starting the day after chemo for 3 days. Then take two times a day as needed for nausea or vomiting.      . predniSONE (DELTASONE) 20 MG tablet Take 40 mg by mouth daily. He is to take on days 1-5 of chemotherapy.      . prochlorperazine (COMPAZINE) 10 MG tablet Take 10 mg by mouth every 6 (six) hours as needed. For nausea.       No current facility-administered medications for this visit.    ALLERGIES:  has No Known Allergies.  REVIEW OF SYSTEMS:  The rest of the 14-point review of system was negative.   Filed Vitals:   06/18/12 1006  BP: 110/70  Pulse: 83  Temp: 97.3 F (36.3 C)  Resp: 18   Wt Readings from Last 3 Encounters:  06/18/12 145 lb 6.4 oz (65.953 kg)  05/30/12 147 lb 3.2 oz (66.769 kg)  05/07/12 147 lb 12.8 oz (67.042 kg)   ECOG Performance status: 0  PHYSICAL EXAMINATION:   General:  Thin-appearing man, in no acute distress.  Eyes:  no scleral icterus.  ENT: There was no longer any residual mass in the left tonsil.  Neck was without thyromegaly.  Lymphatics:  Negative cervical, supraclavicular or axillary adenopathy.  Respiratory: lungs were clear bilaterally without wheezing or crackles.  Cardiovascular:  Regular rate and rhythm, S1/S2, without murmur, rub or gallop.  There was no pedal edema.  GI:  abdomen was soft, flat, nontender, nondistended, without organomegaly.  Muscoloskeletal:  no spinal tenderness of palpation of vertebral spine.  Skin exam was without echymosis, petichae.  Neuro exam was nonfocal.  Patient was able to get on and off exam table  without assistance.  Gait was normal.  Patient was alerted and oriented.  Attention was good.   Language was appropriate.  Mood was normal without depression.  Speech was not pressured.  Thought content was not tangential.      LABORATORY/RADIOLOGY DATA:  Lab Results  Component Value Date   WBC 4.5 06/18/2012   HGB 11.9* 06/18/2012   HCT 32.7* 06/18/2012   PLT 206 06/18/2012   GLUCOSE 95 06/18/2012   ALKPHOS 76 06/18/2012   ALT 25 06/18/2012   AST 23 06/18/2012   NA 141 06/18/2012   K 4.2 06/18/2012   CL 106 06/18/2012   CREATININE 0.9 06/18/2012   BUN 9.7 06/18/2012   CO2 27 06/18/2012   INR 0.97 04/15/2012      ASSESSMENT AND PLAN:   1. History of stage II colon cancer; s/p colectomy in 2010. He is supposedly in remission. His last colonoscopy in 2012 was reportedly negative but is due for one more in 2014.  2. History of bladder cancer in 2010; with local recurrence in 2013.  He follows with Urology.   3. High grade follicular lymphoma of right tonsil; IPI of 1 (or low risk)  - He is status post 3 cycles of chemotherapy R. CHOP with grade 1 fatigue however no dose limiting toxicity. I recommend proceeding with a fourth and final planned cycle chemotherapy without dose modification. I referred him to a restaging PET scan to be about 3 weeks. I will see patient again after the PET scan to discuss treatment options. I will continue with intrathecal chemo to decrease risk of CNS lymphoma.  I also advised to continue with Neulasta to decrease risk of neutropenic fever.  He expressed agreement and wished to proceed.    4.  Slight anemia: Most likely to chemotherapy. There is no active bleeding. There is no need for transfusion.  5.  Nausea/vomiting:  He has Compazine/Zofran prn.

## 2012-06-18 NOTE — Telephone Encounter (Signed)
gv and printed appt schedule for pt for March and april...pt aware cs will contact for d/t for PET

## 2012-06-18 NOTE — Patient Instructions (Addendum)
1.  Diagnosis:  Lymphoma.   2.  Treatment:  Received 3 cycles of R-CHOP with clinical response. 3.  Recommendations:  *  Proceed with the 4th and final planned cycle of R-CHOP this Friday and intrathecal chemo next week.  *  Repeat PET scan in about 3 weeks and will consider referral to Radiation Oncology for consideration of consolidative radiation in April 2014.

## 2012-06-20 ENCOUNTER — Ambulatory Visit: Payer: Medicare Other | Admitting: Oncology

## 2012-06-20 ENCOUNTER — Other Ambulatory Visit: Payer: Medicare Other | Admitting: Lab

## 2012-06-20 ENCOUNTER — Ambulatory Visit (HOSPITAL_BASED_OUTPATIENT_CLINIC_OR_DEPARTMENT_OTHER): Payer: Medicare Other

## 2012-06-20 VITALS — BP 110/67 | HR 79 | Temp 97.4°F

## 2012-06-20 DIAGNOSIS — C8591 Non-Hodgkin lymphoma, unspecified, lymph nodes of head, face, and neck: Secondary | ICD-10-CM

## 2012-06-20 DIAGNOSIS — Z5111 Encounter for antineoplastic chemotherapy: Secondary | ICD-10-CM

## 2012-06-20 DIAGNOSIS — C8291 Follicular lymphoma, unspecified, lymph nodes of head, face, and neck: Secondary | ICD-10-CM

## 2012-06-20 DIAGNOSIS — Z5112 Encounter for antineoplastic immunotherapy: Secondary | ICD-10-CM

## 2012-06-20 MED ORDER — CYCLOPHOSPHAMIDE CHEMO INJECTION 1 GM
750.0000 mg/m2 | Freq: Once | INTRAMUSCULAR | Status: AC
  Administered 2012-06-20: 1400 mg via INTRAVENOUS
  Filled 2012-06-20: qty 70

## 2012-06-20 MED ORDER — DIPHENHYDRAMINE HCL 25 MG PO CAPS
50.0000 mg | ORAL_CAPSULE | Freq: Once | ORAL | Status: AC
  Administered 2012-06-20: 50 mg via ORAL

## 2012-06-20 MED ORDER — ONDANSETRON 16 MG/50ML IVPB (CHCC)
16.0000 mg | Freq: Once | INTRAVENOUS | Status: AC
  Administered 2012-06-20: 16 mg via INTRAVENOUS

## 2012-06-20 MED ORDER — SODIUM CHLORIDE 0.9 % IJ SOLN
10.0000 mL | INTRAMUSCULAR | Status: DC | PRN
Start: 2012-06-20 — End: 2012-06-20
  Administered 2012-06-20: 10 mL
  Filled 2012-06-20: qty 10

## 2012-06-20 MED ORDER — ACETAMINOPHEN 325 MG PO TABS: 650.0000 mg | ORAL_TABLET | Freq: Once | ORAL | Status: DC

## 2012-06-20 MED ORDER — DEXAMETHASONE SODIUM PHOSPHATE 4 MG/ML IJ SOLN
20.0000 mg | Freq: Once | INTRAMUSCULAR | Status: AC
  Administered 2012-06-20: 20 mg via INTRAVENOUS

## 2012-06-20 MED ORDER — SODIUM CHLORIDE 0.9 % IV SOLN
375.0000 mg/m2 | Freq: Once | INTRAVENOUS | Status: AC
  Administered 2012-06-20: 700 mg via INTRAVENOUS
  Filled 2012-06-20: qty 70

## 2012-06-20 MED ORDER — SODIUM CHLORIDE 0.9 % IV SOLN
Freq: Once | INTRAVENOUS | Status: AC
  Administered 2012-06-20: 10:00:00 via INTRAVENOUS

## 2012-06-20 MED ORDER — HEPARIN SOD (PORK) LOCK FLUSH 100 UNIT/ML IV SOLN
500.0000 [IU] | Freq: Once | INTRAVENOUS | Status: AC | PRN
  Administered 2012-06-20: 500 [IU]
  Filled 2012-06-20: qty 5

## 2012-06-20 MED ORDER — DOXORUBICIN HCL CHEMO IV INJECTION 2 MG/ML
50.0000 mg/m2 | Freq: Once | INTRAVENOUS | Status: AC
  Administered 2012-06-20: 94 mg via INTRAVENOUS
  Filled 2012-06-20: qty 47

## 2012-06-20 MED ORDER — VINCRISTINE SULFATE CHEMO INJECTION 1 MG/ML
2.0000 mg | Freq: Once | INTRAVENOUS | Status: AC
  Administered 2012-06-20: 2 mg via INTRAVENOUS
  Filled 2012-06-20: qty 2

## 2012-06-20 NOTE — Patient Instructions (Addendum)
[facility] Discharge Instructions for Patients Receiving Chemotherapy  Today you received the following chemotherapy agents: Adriamycin, Vincristine, Cytoxan, Rituxan  To help prevent nausea and vomiting after your treatment, we encourage you to take your nausea medication as directed by your MD.   If you develop nausea and vomiting that is not controlled by your nausea medication, call the clinic. If it is after clinic hours your family physician or the after hours number for the clinic or go to the Emergency Department.   BELOW ARE SYMPTOMS THAT SHOULD BE REPORTED IMMEDIATELY:  *FEVER GREATER THAN 100.5 F  *CHILLS WITH OR WITHOUT FEVER  NAUSEA AND VOMITING THAT IS NOT CONTROLLED WITH YOUR NAUSEA MEDICATION  *UNUSUAL SHORTNESS OF BREATH  *UNUSUAL BRUISING OR BLEEDING  TENDERNESS IN MOUTH AND THROAT WITH OR WITHOUT PRESENCE OF ULCERS  *URINARY PROBLEMS  *BOWEL PROBLEMS  UNUSUAL RASH Items with * indicate a potential emergency and should be followed up as soon as possible.   Feel free to call the clinic you have any questions or concerns. The clinic phone number is [phone].    

## 2012-06-21 ENCOUNTER — Ambulatory Visit (HOSPITAL_BASED_OUTPATIENT_CLINIC_OR_DEPARTMENT_OTHER): Payer: Medicare Other

## 2012-06-21 VITALS — BP 133/65 | HR 74 | Temp 97.8°F

## 2012-06-21 DIAGNOSIS — C8291 Follicular lymphoma, unspecified, lymph nodes of head, face, and neck: Secondary | ICD-10-CM

## 2012-06-21 DIAGNOSIS — C8591 Non-Hodgkin lymphoma, unspecified, lymph nodes of head, face, and neck: Secondary | ICD-10-CM

## 2012-06-21 MED ORDER — PEGFILGRASTIM INJECTION 6 MG/0.6ML
6.0000 mg | Freq: Once | SUBCUTANEOUS | Status: AC
  Administered 2012-06-21: 6 mg via SUBCUTANEOUS

## 2012-06-23 ENCOUNTER — Ambulatory Visit (HOSPITAL_COMMUNITY)
Admission: RE | Admit: 2012-06-23 | Discharge: 2012-06-23 | Disposition: A | Discharge status: Home / Self Care | Payer: Medicare Other | Source: Ambulatory Visit | Attending: Oncology | Admitting: Oncology

## 2012-06-23 ENCOUNTER — Encounter (HOSPITAL_COMMUNITY): Payer: Self-pay

## 2012-06-23 DIAGNOSIS — C8591 Non-Hodgkin lymphoma, unspecified, lymph nodes of head, face, and neck: Secondary | ICD-10-CM

## 2012-06-23 DIAGNOSIS — C8589 Other specified types of non-Hodgkin lymphoma, extranodal and solid organ sites: Secondary | ICD-10-CM | POA: Insufficient documentation

## 2012-06-23 MED ORDER — SODIUM CHLORIDE 0.9 % IJ SOLN
Freq: Once | INTRAMUSCULAR | Status: AC
  Administered 2012-06-23: 11:00:00 via INTRATHECAL
  Filled 2012-06-23: qty 0.48

## 2012-06-23 NOTE — Procedures (Signed)
Procedure: Fluoroscopic Guided Lumbar Puncture with injection of Chemotherapy . Specimen: None Bleeding: Minimal. Complications: None immediate. Patient   -Condition: Stable.  -Disposition:  To short stay for recovery.  Full Radiology report to follow under Imaging

## 2012-06-24 ENCOUNTER — Other Ambulatory Visit: Payer: Self-pay | Admitting: Certified Registered Nurse Anesthetist

## 2012-06-25 ENCOUNTER — Other Ambulatory Visit: Payer: Self-pay | Admitting: Certified Registered Nurse Anesthetist

## 2012-07-09 ENCOUNTER — Encounter (HOSPITAL_COMMUNITY)
Admission: RE | Admit: 2012-07-09 | Discharge: 2012-07-09 | Disposition: A | Discharge status: Home / Self Care | Payer: Medicare Other | Source: Ambulatory Visit | Attending: Oncology | Admitting: Oncology

## 2012-07-09 DIAGNOSIS — C8581 Other specified types of non-Hodgkin lymphoma, lymph nodes of head, face, and neck: Secondary | ICD-10-CM | POA: Insufficient documentation

## 2012-07-09 DIAGNOSIS — C8591 Non-Hodgkin lymphoma, unspecified, lymph nodes of head, face, and neck: Secondary | ICD-10-CM

## 2012-07-09 LAB — GLUCOSE, CAPILLARY: Glucose-Capillary: 97 mg/dL (ref 70–99)

## 2012-07-09 MED ORDER — FLUDEOXYGLUCOSE F - 18 (FDG) INJECTION
18.0000 | Freq: Once | INTRAVENOUS | Status: AC | PRN
  Administered 2012-07-09: 18 via INTRAVENOUS

## 2012-07-11 ENCOUNTER — Ambulatory Visit: Payer: Medicare Other

## 2012-07-11 ENCOUNTER — Encounter: Payer: Self-pay | Admitting: Oncology

## 2012-07-11 ENCOUNTER — Telehealth: Payer: Self-pay | Admitting: Dietician

## 2012-07-11 ENCOUNTER — Ambulatory Visit (HOSPITAL_BASED_OUTPATIENT_CLINIC_OR_DEPARTMENT_OTHER): Payer: Medicare Other | Admitting: Oncology

## 2012-07-11 ENCOUNTER — Telehealth: Payer: Self-pay | Admitting: Oncology

## 2012-07-11 ENCOUNTER — Other Ambulatory Visit (HOSPITAL_BASED_OUTPATIENT_CLINIC_OR_DEPARTMENT_OTHER): Payer: Medicare Other | Admitting: Lab

## 2012-07-11 VITALS — BP 121/73 | HR 78 | Temp 97.0°F | Resp 18 | Ht 71.0 in | Wt 143.0 lb

## 2012-07-11 DIAGNOSIS — C8581 Other specified types of non-Hodgkin lymphoma, lymph nodes of head, face, and neck: Secondary | ICD-10-CM

## 2012-07-11 DIAGNOSIS — C8591 Non-Hodgkin lymphoma, unspecified, lymph nodes of head, face, and neck: Secondary | ICD-10-CM

## 2012-07-11 DIAGNOSIS — C679 Malignant neoplasm of bladder, unspecified: Secondary | ICD-10-CM

## 2012-07-11 DIAGNOSIS — C189 Malignant neoplasm of colon, unspecified: Secondary | ICD-10-CM

## 2012-07-11 DIAGNOSIS — D649 Anemia, unspecified: Secondary | ICD-10-CM

## 2012-07-11 LAB — CBC WITH DIFFERENTIAL/PLATELET
Basophils Absolute: 0 10*3/uL (ref 0.0–0.1)
Eosinophils Absolute: 0 10*3/uL (ref 0.0–0.5)
HGB: 11.9 g/dL — ABNORMAL LOW (ref 13.0–17.1)
MONO#: 0.4 10*3/uL (ref 0.1–0.9)
NEUT#: 4.5 10*3/uL (ref 1.5–6.5)
RDW: 16.4 % — ABNORMAL HIGH (ref 11.0–14.6)
WBC: 5.8 10*3/uL (ref 4.0–10.3)
lymph#: 0.8 10*3/uL — ABNORMAL LOW (ref 0.9–3.3)

## 2012-07-11 LAB — COMPREHENSIVE METABOLIC PANEL (CC13)
AST: 24 U/L (ref 5–34)
Albumin: 3.2 g/dL — ABNORMAL LOW (ref 3.5–5.0)
BUN: 7.4 mg/dL (ref 7.0–26.0)
Calcium: 8.8 mg/dL (ref 8.4–10.4)
Chloride: 107 mEq/L (ref 98–107)
Glucose: 96 mg/dl (ref 70–99)
Potassium: 3.9 mEq/L (ref 3.5–5.1)
Sodium: 141 mEq/L (ref 136–145)
Total Protein: 6.4 g/dL (ref 6.4–8.3)

## 2012-07-11 NOTE — Progress Notes (Signed)
[facility]  Telephone:(336) [phone] Fax:(336) [phone]   OFFICE PROGRESS NOTE   Cc:  Rudi Heap, MD  DIAGNOSIS: stage I, high grade follicular lymphoma of left tonsil; IPI score of 1 low low risk.   PAST THERAPY:  started on 04/18/2012 R-CHOP today along with prophylactic intrathecal chemo. He received 4 cycles of R-CHOP and 3 doses of intrathecal chemo  CURRENT THERAPY: pending evaluation for consolidation radiation.  INTERVAL HISTORY: Kenneth Reed 67 y.o. male returns for regular follow up with his sister. He feels moderate fatigue.  He is still independent of all activities of daily living. However he does not do work in the yard anymore as he was before chemotherapy. He has no appetite due to anorexia. He has weight loss. He denies any nausea vomiting, mucositis, dysphagia, odynophagia, palpable lymph node swelling, fever, leading symptom, neuropathy, cough, diarrhea, skin rash. The rest of the 14 point review of system was negative.   Past Medical History  Diagnosis Date  . Non-Hodgkin's lymphoma of head   . Fatigue   . Colon cancer 2010    Dr. Gabriel Cirri operated; stage II.  No need for chemo.  . Bladder cancer 2010    Dr. Rito Ehrlich operated; no need for chemo; recurred locally  in 2013 locally.    Marland Kitchen BPH (benign prostatic hyperplasia)     Past Surgical History  Procedure Laterality Date  . Nasal polyp surgery  1983  . Colectomy  2010  . Bladder surgery  2010  . Colonoscopy  2012    next one 2014.     Current Outpatient Prescriptions  Medication Sig Dispense Refill  . albuterol (PROVENTIL HFA;VENTOLIN HFA) 108 (90 BASE) MCG/ACT inhaler Inhale 2 puffs into the lungs every 4 (four) hours as needed. For shortness of breath.      . finasteride (PROSCAR) 5 MG tablet Take 5 mg by mouth at bedtime.       . Fluticasone-Salmeterol (ADVAIR) 250-50 MCG/DOSE AEPB Inhale 1 puff into the lungs every 12 (twelve) hours.      Marland Kitchen HYDROcodone-acetaminophen (NORCO)  5-325 MG per tablet Take 1 tablet by mouth every 6 (six) hours as needed for pain.  30 tablet  0  . lidocaine-prilocaine (EMLA) cream Apply 1 application topically daily as needed. Applies to port-a-cath site one hour prior to needle stick.      Marland Kitchen ondansetron (ZOFRAN) 8 MG tablet Take 8 mg by mouth 2 (two) times daily. He is to take two times a day starting the day after chemo for 3 days. Then take two times a day as needed for nausea or vomiting.      . predniSONE (DELTASONE) 20 MG tablet Take 40 mg by mouth daily. He is to take on days 1-5 of chemotherapy.      . prochlorperazine (COMPAZINE) 10 MG tablet Take 10 mg by mouth every 6 (six) hours as needed. For nausea.       No current facility-administered medications for this visit.    ALLERGIES:  has No Known Allergies.  REVIEW OF SYSTEMS:  The rest of the 14-point review of system was negative.   Filed Vitals:   07/11/12 0831  BP: 121/73  Pulse: 78  Temp: 97 F (36.1 C)  Resp: 18   Wt Readings from Last 3 Encounters:  07/11/12 143 lb (64.864 kg)  06/18/12 145 lb 6.4 oz (65.953 kg)  05/30/12 147 lb 3.2 oz (66.769 kg)   ECOG Performance status: 1-2  PHYSICAL  EXAMINATION:   General:  Thin-appearing man, in no acute distress.  Eyes:  no scleral icterus.  ENT: There was no longer any residual mass in the left tonsil. However, the left tonsillar fossa was thickened compared to the right side. Neck was without thyromegaly.  Lymphatics:  Negative cervical, supraclavicular or axillary adenopathy.  Respiratory: lungs were clear bilaterally without wheezing or crackles.  Cardiovascular:  Regular rate and rhythm, S1/S2, without murmur, rub or gallop.  There was no pedal edema.  GI:  abdomen was soft, flat, nontender, nondistended, without organomegaly.  Muscoloskeletal:  no spinal tenderness of palpation of vertebral spine.  Skin exam was without echymosis, petichae.  Neuro exam was nonfocal.  Patient was able to get on and off exam table  without assistance.  Gait was normal.  Patient was alerted and oriented.  Attention was good.   Language was appropriate.  Mood was normal without depression.  Speech was not pressured.  Thought content was not tangential.      LABORATORY/RADIOLOGY DATA:  Lab Results  Component Value Date   WBC 5.8 07/11/2012   HGB 11.9* 07/11/2012   HCT 35.0* 07/11/2012   PLT 179 07/11/2012   GLUCOSE 95 06/18/2012   ALKPHOS 76 06/18/2012   ALT 25 06/18/2012   AST 23 06/18/2012   NA 141 06/18/2012   K 4.2 06/18/2012   CL 106 06/18/2012   CREATININE 0.9 06/18/2012   BUN 9.7 06/18/2012   CO2 27 06/18/2012   INR 0.97 04/15/2012      ASSESSMENT AND PLAN:   1. History of stage II colon cancer; s/p colectomy in 2010. He is supposedly in remission. His last colonoscopy in 2012 was reportedly negative but is due for one more in 2014.  2. History of bladder cancer in 2010; with local recurrence in 2013.  He follows with Urology.   3. High grade follicular lymphoma of left tonsil; IPI of 1 (or low risk)  - He is status post 4 cycles of chemotherapy R. CHOP.  He achieved complete radiographic response. He is grade 1-2 fatigue, grade 1 anemia, grade 2 anorexia, weight loss. Since he only had stage I disease, I recommend no further chemotherapy and proceeding with evaluation by radiation oncology for the role of consolidative radiation.  He and his sister expressed informed understanding wished to pursue the recommendation.   4.  Slight anemia: Most likely to chemotherapy. There is no active bleeding. There is no need for transfusion.  5.  Nausea/vomiting:  He has Compazine/Zofran prn.   6.  fatigue, due to chemotherapy. Hopefully this will improve being off chemotherapy. I advised him to be more active.  7.  anorexia, weight loss: Due to chemotherapy. Again this will improve being off of chemotherapy the next few months.  8.  followup: In about 4 months. I discussed with Mr. Arment and his sister that there is no role  for surveillance CT scan. However if he has any focal symptoms such as dysphagia, odynophagia, node swelling, constitutional symptom, we will obtain a followup scan at that time. I arranged for him to have his Port-A-Cath flush every 6-8 weeks for one year. In one year, we may consider removing his Port-A-Cath it has no recurrent disease.  I said good bye to the patient and his sister since I will be leaving the practice in about 4 months.  His care will be taken over by another physician in the practice as determined by the administration.    The length of  time of the face-to-face encounter was 15 minutes. More than 50% of time was spent counseling and coordination of care.

## 2012-07-11 NOTE — Telephone Encounter (Signed)
Brief Outpatient Oncology Nutrition Note  Patient has been identified to be at risk on malnutrition screen.   Wt Readings from Last 10 Encounters:  07/11/12 143 lb (64.864 kg)  06/18/12 145 lb 6.4 oz (65.953 kg)  05/30/12 147 lb 3.2 oz (66.769 kg)  05/07/12 147 lb 12.8 oz (67.042 kg)  04/24/12 148 lb 12.8 oz (67.495 kg)  04/17/12 151 lb (68.493 kg)  04/07/12 152 lb 1.6 oz (68.992 kg)    Called patient.  Voice mail is full and I was unable to leave a message.  Patient with weight loss as above.  Chart reviewed.  Decreased appetite hoped to improve off chemo.  Oran Rein, RD, LDN

## 2012-07-11 NOTE — Telephone Encounter (Signed)
Gave pt appt for lab, Ml and flushes, pt will see Radiation Onc on 07/15/12

## 2012-07-11 NOTE — Treatment Plan (Signed)
[facility] END OF TREATMENT   Name: Kenneth Reed Date: 07/11/2012 MRN: [id number] DOB: 02/29/1946   TREATMENT DATES:  04/2012 to 06/2012.    REFERRING PHYSICIAN: Dr. Christia Reading, M.D.  DIAGNOSIS: High-grade follicular non-Hodgkin B-cell lymphoma  STAGE AT START OF TREATMENT: Stage I   INTENT:Curative   DRUGS OR REGIMENS GIVEN: R. CHOP in addition to intrathecal chemotherapy methotrexate   MAJOR TOXICITIES: Grade 1-2 fatigue, grade 1-2 anorexia and weight loss.   REASON TREATMENT STOPPED:  Finish of planned therapy   PERFORMANCE STATUS AT END: ECoG 1-2   ONGOING PROBLEMS: None   FOLLOW UP PLANS: Evaluation by radiation oncology for role of consolidative radiation.

## 2012-07-11 NOTE — Progress Notes (Unsigned)
Opened chart in error, treatmemt cancelled for today.

## 2012-07-11 NOTE — Patient Instructions (Addendum)
1. Diagnosis: Stage I, high-grade follicular lymphoma.  2. Treatment 4 cycles of chemotherapy R. CHOP in addition to intrathecal chemotherapy to decrease the risk of intracranial recurrence.  3. Status: No residual disease on followup PET scan.  4. Recommendation: Evaluation by radiation oncology to see the role of radiation. 5. Followup: With medical oncology in about 4 months.

## 2012-07-12 ENCOUNTER — Ambulatory Visit: Payer: Medicare Other

## 2012-07-14 ENCOUNTER — Encounter: Payer: Self-pay | Admitting: *Deleted

## 2012-07-15 ENCOUNTER — Ambulatory Visit
Admission: RE | Admit: 2012-07-15 | Discharge: 2012-07-15 | Disposition: A | Discharge status: Home / Self Care | Payer: Medicare Other | Source: Ambulatory Visit | Attending: Radiation Oncology | Admitting: Radiation Oncology

## 2012-07-15 ENCOUNTER — Encounter: Payer: Self-pay | Admitting: Radiation Oncology

## 2012-07-15 VITALS — BP 109/82 | HR 92 | Temp 98.1°F | Ht 72.0 in | Wt 145.0 lb

## 2012-07-15 VITALS — BP 109/82 | HR 92 | Temp 98.1°F | Ht 72.0 in | Wt 145.1 lb

## 2012-07-15 DIAGNOSIS — C8599 Non-Hodgkin lymphoma, unspecified, extranodal and solid organ sites: Secondary | ICD-10-CM

## 2012-07-15 DIAGNOSIS — Z85038 Personal history of other malignant neoplasm of large intestine: Secondary | ICD-10-CM | POA: Insufficient documentation

## 2012-07-15 DIAGNOSIS — C8589 Other specified types of non-Hodgkin lymphoma, extranodal and solid organ sites: Secondary | ICD-10-CM | POA: Insufficient documentation

## 2012-07-15 DIAGNOSIS — Z8551 Personal history of malignant neoplasm of bladder: Secondary | ICD-10-CM | POA: Insufficient documentation

## 2012-07-15 DIAGNOSIS — Z79899 Other long term (current) drug therapy: Secondary | ICD-10-CM | POA: Insufficient documentation

## 2012-07-15 DIAGNOSIS — Z9221 Personal history of antineoplastic chemotherapy: Secondary | ICD-10-CM | POA: Insufficient documentation

## 2012-07-15 NOTE — Progress Notes (Signed)
Kenneth Reed here today for assessment for Tonsillar cancer.  Kenneth Schillaci stated that he first noticed that he food was sticking in his throat on the right and then he noted an enlarged node which precipitated his visit to his physician who then referred him to Dr. Christia Reading.  Currently he states he does not have "much of an appetite" and he has to "make himself eat';  "nothing taste right".  He drinks 3 ensure daily, so encouraged him to drink 4-5 daily.  Also drinking "lots" of gatorade.  He reports that his normal weight is 148 lbs.

## 2012-07-15 NOTE — Progress Notes (Signed)
Radiation Oncology         [phone] ________________________________  Initial outpatient Consultation  Name: Kenneth Reed MRN: [id number]  Date: 07/15/2012  DOB: 1946/01/15  NW:GNFAO, Dorinda Hill, MD  Exie Parody, MD   REFERRING PHYSICIAN: Exie Parody, MD  DIAGNOSIS: Stage IA high grade follicular non-Hodgkin's B cell lymphoma of the left tonsil  HISTORY OF PRESENT ILLNESS::Kenneth Reed is a 67 y.o. male who reports that in December 2013 he felt that food is starting to hang in his throat. Note is that his left tonsil was enlarged with exudates. He did not have any recurrent fevers or night sweats or significant weight loss at that time.  The patient was seen by otolaryngology. He underwent biopsy of the left tonsil on 03/26/2012. This demonstrated non-Hodgkin's B-cell lymphoma favoring follicular type favoring high grade.   PET scan on 04/11/2012 demonstrated a hypermetabolic mass in the left tonsillar region. He did have a lesion in the suboccipital scalp which resolved with antibiotics. This was favored to be inflammatory, infectious. No other sites of disease.  Bone marrow biopsy on 04/08/2012 was negative.  He did undergo lumbar puncture yielding 7 mL of colorless clear fluid. Insufficient cells were present for analysis.  The patient received 4 cycles of R. CHOP chemotherapy in addition to intrathecal chemotherapy in the form of methotrexate for CSF prophylaxis. He received 3 doses of intrathecal chemotherapy. He completed this in March. He experienced grade 1-2 fatigue and grade 1-2 anorexia. He had a complete response per PET scan on 07/09/2012.  Today, he denies dysphagia. He has some generalized weakness/fatigue from chemotherapy.  PREVIOUS RADIATION THERAPY: No  PAST MEDICAL HISTORY:  has a past medical history of Non-Hodgkin's lymphoma of head; Fatigue; Colon cancer (2010); Bladder cancer (2010); BPH (benign prostatic hyperplasia); Cancer (03/26/12); Status post chemotherapy  (04/18/2012 - 06/20/12); and Weight loss (07/11/12 note).    PAST SURGICAL HISTORY: Past Surgical History  Procedure Laterality Date  . Nasal polyp surgery  1983  . Colectomy  2010  . Bladder surgery  2010  . Colonoscopy  2012    next one 2014.   . Biospy left tonsil  03/26/12    Non - Hodgkins B-Cell Lympyhoma    FAMILY HISTORY: family history includes Cancer in his father, maternal aunt, and paternal grandmother.  SOCIAL HISTORY:  reports that he has never smoked. He uses smokeless tobacco. He reports that he does not drink alcohol or use illicit drugs.  ALLERGIES: Review of patient's allergies indicates no known allergies.  MEDICATIONS:  Current Outpatient Prescriptions  Medication Sig Dispense Refill  . albuterol (PROVENTIL HFA;VENTOLIN HFA) 108 (90 BASE) MCG/ACT inhaler Inhale 2 puffs into the lungs every 4 (four) hours as needed. For shortness of breath.      . finasteride (PROSCAR) 5 MG tablet Take 5 mg by mouth at bedtime.       . Fluticasone-Salmeterol (ADVAIR) 250-50 MCG/DOSE AEPB Inhale 1 puff into the lungs every 12 (twelve) hours.      Marland Kitchen HYDROcodone-acetaminophen (NORCO) 5-325 MG per tablet Take 1 tablet by mouth every 6 (six) hours as needed for pain.  30 tablet  0  . lidocaine-prilocaine (EMLA) cream Apply 1 application topically daily as needed. Applies to port-a-cath site one hour prior to needle stick.      Marland Kitchen ondansetron (ZOFRAN) 8 MG tablet Take 8 mg by mouth 2 (two) times daily. He is to take two times a day starting the day after chemo for 3 days.  Then take two times a day as needed for nausea or vomiting.      . prochlorperazine (COMPAZINE) 10 MG tablet Take 10 mg by mouth every 6 (six) hours as needed. For nausea.       No current facility-administered medications for this encounter.    REVIEW OF SYSTEMS:   Pertinent items are noted in HPI.   PHYSICAL EXAM:  height is 6' (1.829 m) and weight is 145 lb 1.6 oz (65.817 kg). His temperature is 98.1 F (36.7 C). His  blood pressure is 109/82 and his pulse is 92. His oxygen saturation is 98%.   General: Alert and oriented, in no acute distress HEENT: Head is normocephalic.Extraocular movements are intact. Oropharynx is clear. Neck: Neck is supple, no palpable cervical or supraclavicular lymphadenopathy. Heart: Regular in rate and rhythm with no murmurs, rubs, or gallops. Chest: Clear to auscultation bilaterally, with no rhonchi, wheezes, or rales. Abdomen: Soft, nontender, nondistended, with no rigidity or guarding. Extremities: No cyanosis or edema. Skin: No concerning lesions. Musculoskeletal: symmetric strength and muscle tone throughout. Neurologic: Cranial nerves II through XII are grossly intact. No obvious focalities. Speech is fluent. Coordination is intact. Psychiatric: Judgment and insight are intact. Affect is appropriate.    LABORATORY DATA:  Lab Results  Component Value Date   WBC 5.8 07/11/2012   HGB 11.9* 07/11/2012   HCT 35.0* 07/11/2012   MCV 94.1 07/11/2012   PLT 179 07/11/2012   CMP     Component Value Date/Time   NA 141 07/11/2012 0817   K 3.9 07/11/2012 0817   CL 107 07/11/2012 0817   CO2 27 07/11/2012 0817   GLUCOSE 96 07/11/2012 0817   BUN 7.4 07/11/2012 0817   CREATININE 0.8 07/11/2012 0817   CALCIUM 8.8 07/11/2012 0817   PROT 6.4 07/11/2012 0817   ALBUMIN 3.2* 07/11/2012 0817   AST 24 07/11/2012 0817   ALT 21 07/11/2012 0817   ALKPHOS 78 07/11/2012 0817   BILITOT 0.66 07/11/2012 0817         RADIOGRAPHY: Nm Pet Image Restag (ps) Skull Base To Thigh  07/09/2012  *RADIOLOGY REPORT*  Clinical Data: Subsequent treatment strategy for  non Hodgkin's lymphoma of the tonsils.  NUCLEAR MEDICINE PET SKULL BASE TO THIGH  Fasting Blood Glucose:  97  Technique:  18 mCi F-18 FDG was injected intravenously. CT data was obtained and used for attenuation correction and anatomic localization only.  (This was not acquired as a diagnostic CT examination.) Additional exam technical data entered on technologist  worksheet.  Comparison:  PET CT scan 1/3 /2014, CT scan 11/19/2008  Findings:  Neck: There is resolution of hypermetabolic activity previously seen in the left tonsil.  No hypermetabolic nodes in the neck to suggest residual lymphoma.  There is a subcutaneous nodule posterior to the right occipital bone with hypermetabolic activity.  This is unchanged in size from prior measuring 14 mm.  Chest:  No hypermetabolic mediastinal or hilar nodes.  No suspicious pulmonary nodules on the CT scan. There are calcified infrahilar nodes on the right with mild metabolic activity which is inflammatory.  Abdomen/Pelvis:  No abnormal hypermetabolic activity within the liver, pancreas, adrenal glands, or spleen.  No hypermetabolic lymph nodes in the abdomen or pelvis.  Skeleton:  No focal hypermetabolic activity to suggest skeletal metastasis.  IMPRESSION:  1.  No evidence of residual lymphoma in the head and neck. 2.  No evidence of distant metastatic disease. 3.  Subcutaneous hypermetabolic nodule in occiput is likely infectious  in etiology.   Original Report Authenticated By: Genevive Bi, M.D.      IMPRESSION/PLAN: This is a lovely 67 year old gentleman with a history of stage IA high grade follicular non-Hodgkin's B-cell lymphoma of the left tonsil.  He had a complete response with the chemotherapy as described above.  I do think he is a good candidate for local radiotherapy to prevent recurrence. He lives closer to [facility] and is interested in pursuing care there. I will make a referral to Dr. Lance Bosch. I've spoken with Dr. Thersa Salt about this case. I let the patient know that radiotherapy would likely take approximately 4 weeks to complete. We spoke about the logistics of treatment planning and treatment delivery as well as general side effects from this type of treatment. The patient looks forward to meeting Dr. Thersa Salt. I wished him the very best.  I spent 30 minutes face to face with the  patient and more than 50% of that time was spent in counseling and/or coordination of care.    __________________________________________   Lonie Peak, MD

## 2012-07-16 ENCOUNTER — Telehealth: Payer: Self-pay | Admitting: Radiation Oncology

## 2012-07-16 ENCOUNTER — Telehealth: Payer: Self-pay | Admitting: *Deleted

## 2012-07-16 ENCOUNTER — Encounter: Payer: Self-pay | Admitting: Radiation Oncology

## 2012-07-16 NOTE — Telephone Encounter (Signed)
Called patient to inform that appt. For consultation with Dr. Wynonia Hazard has been moved to April 29- arrival time - 7:15 am , due to Dr. Wynonia Hazard being on vacation

## 2012-07-16 NOTE — Telephone Encounter (Signed)
Per SES, faxed records to Dr. Thersa Salt in [place].  Received confirmation.

## 2012-07-16 NOTE — Telephone Encounter (Signed)
Called patient to inform of appt. With Dr. Thersa Salt on April 21- arrival time - 7:15 am, spoke with patient and he is aware of this appt.

## 2012-07-18 ENCOUNTER — Telehealth: Payer: Self-pay | Admitting: *Deleted

## 2012-07-18 NOTE — Addendum Note (Signed)
Encounter addended by: Jamyah Folk Mintz Natalyia Innes, RN on: 07/18/2012 10:03 AM<BR>     Documentation filed: Charges VN

## 2012-07-18 NOTE — Telephone Encounter (Signed)
Called patient to inform that consultation with Dr. Thersa Salt has been moved to 07-22-12- arrival time - 8:45 am, spoke with patient and he is aware of the appt. Change and he is good with it.

## 2012-07-22 ENCOUNTER — Telehealth: Payer: Self-pay | Admitting: Dietician

## 2012-07-28 ENCOUNTER — Other Ambulatory Visit: Payer: Self-pay

## 2012-07-28 MED ORDER — ALBUTEROL SULFATE HFA 108 (90 BASE) MCG/ACT IN AERS: 2.0000 | INHALATION_SPRAY | RESPIRATORY_TRACT | Status: DC | PRN

## 2012-08-01 ENCOUNTER — Ambulatory Visit: Payer: Medicare Other

## 2012-08-02 ENCOUNTER — Ambulatory Visit: Payer: Medicare Other

## 2012-08-08 ENCOUNTER — Ambulatory Visit (HOSPITAL_BASED_OUTPATIENT_CLINIC_OR_DEPARTMENT_OTHER): Payer: Medicare Other

## 2012-08-08 VITALS — BP 110/68 | HR 67 | Temp 96.9°F

## 2012-08-08 DIAGNOSIS — C8590 Non-Hodgkin lymphoma, unspecified, unspecified site: Secondary | ICD-10-CM

## 2012-08-08 DIAGNOSIS — Z452 Encounter for adjustment and management of vascular access device: Secondary | ICD-10-CM

## 2012-08-08 DIAGNOSIS — C8291 Follicular lymphoma, unspecified, lymph nodes of head, face, and neck: Secondary | ICD-10-CM

## 2012-08-08 MED ORDER — SODIUM CHLORIDE 0.9 % IJ SOLN
10.0000 mL | INTRAMUSCULAR | Status: DC | PRN
  Administered 2012-08-08: 10 mL via INTRAVENOUS
  Filled 2012-08-08: qty 10

## 2012-08-08 MED ORDER — HEPARIN SOD (PORK) LOCK FLUSH 100 UNIT/ML IV SOLN
500.0000 [IU] | Freq: Once | INTRAVENOUS | Status: AC
  Administered 2012-08-08: 500 [IU] via INTRAVENOUS
  Filled 2012-08-08: qty 5

## 2012-08-08 NOTE — Patient Instructions (Signed)
Call MD for problems or concerns 

## 2012-10-03 ENCOUNTER — Ambulatory Visit (HOSPITAL_BASED_OUTPATIENT_CLINIC_OR_DEPARTMENT_OTHER): Payer: Medicare Other

## 2012-10-03 VITALS — BP 123/73 | HR 74 | Temp 97.1°F | Resp 18

## 2012-10-03 DIAGNOSIS — C8581 Other specified types of non-Hodgkin lymphoma, lymph nodes of head, face, and neck: Secondary | ICD-10-CM

## 2012-10-03 DIAGNOSIS — Z452 Encounter for adjustment and management of vascular access device: Secondary | ICD-10-CM

## 2012-10-03 MED ORDER — SODIUM CHLORIDE 0.9 % IJ SOLN
10.0000 mL | INTRAMUSCULAR | Status: DC | PRN
  Administered 2012-10-03: 10 mL via INTRAVENOUS
  Filled 2012-10-03: qty 10

## 2012-10-03 MED ORDER — HEPARIN SOD (PORK) LOCK FLUSH 100 UNIT/ML IV SOLN
500.0000 [IU] | Freq: Once | INTRAVENOUS | Status: AC
  Administered 2012-10-03: 500 [IU] via INTRAVENOUS
  Filled 2012-10-03: qty 5

## 2012-10-29 ENCOUNTER — Other Ambulatory Visit: Payer: Self-pay | Admitting: *Deleted

## 2012-10-29 DIAGNOSIS — C8591 Non-Hodgkin lymphoma, unspecified, lymph nodes of head, face, and neck: Secondary | ICD-10-CM

## 2012-10-29 DIAGNOSIS — R5383 Other fatigue: Secondary | ICD-10-CM

## 2012-10-29 MED ORDER — FINASTERIDE 5 MG PO TABS: 5.0000 mg | ORAL_TABLET | Freq: Every day | ORAL | Status: DC

## 2012-10-29 NOTE — Telephone Encounter (Signed)
LAST RF 12/13.

## 2012-11-10 ENCOUNTER — Ambulatory Visit: Payer: Medicare Other | Admitting: Oncology

## 2012-11-27 ENCOUNTER — Other Ambulatory Visit: Payer: Self-pay

## 2012-11-27 DIAGNOSIS — C8599 Non-Hodgkin lymphoma, unspecified, extranodal and solid organ sites: Secondary | ICD-10-CM

## 2012-11-27 DIAGNOSIS — C8591 Non-Hodgkin lymphoma, unspecified, lymph nodes of head, face, and neck: Secondary | ICD-10-CM

## 2012-11-28 ENCOUNTER — Other Ambulatory Visit (HOSPITAL_BASED_OUTPATIENT_CLINIC_OR_DEPARTMENT_OTHER): Payer: Medicare Other

## 2012-11-28 ENCOUNTER — Ambulatory Visit (HOSPITAL_BASED_OUTPATIENT_CLINIC_OR_DEPARTMENT_OTHER): Payer: Medicare Other

## 2012-11-28 ENCOUNTER — Telehealth: Payer: Self-pay | Admitting: Oncology

## 2012-11-28 ENCOUNTER — Encounter: Payer: Self-pay | Admitting: Oncology

## 2012-11-28 ENCOUNTER — Ambulatory Visit (HOSPITAL_BASED_OUTPATIENT_CLINIC_OR_DEPARTMENT_OTHER): Payer: Medicare Other | Admitting: Oncology

## 2012-11-28 VITALS — BP 123/81 | HR 75 | Temp 100.5°F | Resp 18 | Ht 72.0 in | Wt 141.4 lb

## 2012-11-28 VITALS — BP 123/83 | HR 67 | Temp 98.1°F | Resp 20

## 2012-11-28 DIAGNOSIS — C8581 Other specified types of non-Hodgkin lymphoma, lymph nodes of head, face, and neck: Secondary | ICD-10-CM

## 2012-11-28 DIAGNOSIS — Z452 Encounter for adjustment and management of vascular access device: Secondary | ICD-10-CM

## 2012-11-28 DIAGNOSIS — C8599 Non-Hodgkin lymphoma, unspecified, extranodal and solid organ sites: Secondary | ICD-10-CM

## 2012-11-28 DIAGNOSIS — C8591 Non-Hodgkin lymphoma, unspecified, lymph nodes of head, face, and neck: Secondary | ICD-10-CM

## 2012-11-28 LAB — COMPREHENSIVE METABOLIC PANEL (CC13)
ALT: 12 U/L (ref 0–55)
AST: 16 U/L (ref 5–34)
Albumin: 3.8 g/dL (ref 3.5–5.0)
Alkaline Phosphatase: 56 U/L (ref 40–150)
BUN: 8.2 mg/dL (ref 7.0–26.0)
Calcium: 9.3 mg/dL (ref 8.4–10.4)
Chloride: 106 mEq/L (ref 98–109)
Potassium: 4.2 mEq/L (ref 3.5–5.1)
Sodium: 140 mEq/L (ref 136–145)
Total Protein: 6.9 g/dL (ref 6.4–8.3)

## 2012-11-28 LAB — CBC WITH DIFFERENTIAL/PLATELET
Basophils Absolute: 0 10*3/uL (ref 0.0–0.1)
EOS%: 2.3 % (ref 0.0–7.0)
HGB: 16 g/dL (ref 13.0–17.1)
MCH: 32.6 pg (ref 27.2–33.4)
MONO%: 8 % (ref 0.0–14.0)
NEUT#: 2.2 10*3/uL (ref 1.5–6.5)
RBC: 4.91 10*6/uL (ref 4.20–5.82)
RDW: 13.8 % (ref 11.0–14.6)
lymph#: 0.6 10*3/uL — ABNORMAL LOW (ref 0.9–3.3)

## 2012-11-28 MED ORDER — SODIUM CHLORIDE 0.9 % IJ SOLN
10.0000 mL | INTRAMUSCULAR | Status: DC | PRN
  Administered 2012-11-28: 10 mL via INTRAVENOUS
  Filled 2012-11-28: qty 10

## 2012-11-28 MED ORDER — HEPARIN SOD (PORK) LOCK FLUSH 100 UNIT/ML IV SOLN
500.0000 [IU] | Freq: Once | INTRAVENOUS | Status: AC
  Administered 2012-11-28: 500 [IU] via INTRAVENOUS
  Filled 2012-11-28: qty 5

## 2012-11-28 NOTE — Progress Notes (Signed)
[facility]  Telephone:([phone] Fax:([phone]) [phone]   OFFICE PROGRESS NOTE   Cc:  Rudi Heap, MD  DIAGNOSIS: stage I, high grade follicular lymphoma of left tonsil; IPI score of 1 low low risk.   PAST THERAPY:  started on 04/18/2012 R-CHOP today along with prophylactic intrathecal chemo. He received 4 cycles of R-CHOP and 3 doses of intrathecal chemo. He then received consolidative XRT at [facility].  CURRENT THERAPY: Watchful observation.  INTERVAL HISTORY: Kenneth Reed 67 y.o. male returns for regular follow up with his sister. He feels moderate fatigue, but better since completing treatment.  He is still independent of all activities of daily living. Appetite is improving. Weight is stable. He denies any nausea vomiting, mucositis, dysphagia, odynophagia, palpable lymph node swelling, fever, leading symptom, neuropathy, cough, diarrhea, skin rash. The rest of the 14 point review of system was negative.   Past Medical History  Diagnosis Date  . Non-Hodgkin's lymphoma of head   . Fatigue   . Colon cancer 2010    Dr. Gabriel Cirri operated; stage II.  No need for chemo.  . Bladder cancer 2010    Dr. Rito Ehrlich operated; no need for chemo; recurred locally  in 2013 locally.    Marland Kitchen BPH (benign prostatic hyperplasia)   . Cancer 03/26/12    High Grade NonHodgkin's B- Cell Lymphoma  . Status post chemotherapy 04/18/2012 - 06/20/12     4 Cycles of R-CHOP with 3 Doses of Prophylactic Intrathecal chemo.  . Weight loss 07/11/12 note    Past Surgical History  Procedure Laterality Date  . Nasal polyp surgery  1983  . Colectomy  2010  . Bladder surgery  2010  . Colonoscopy  2012    next one 2014.   . Biospy left tonsil  03/26/12    Non - Hodgkins B-Cell Lympyhoma    Current Outpatient Prescriptions  Medication Sig Dispense Refill  . albuterol (PROVENTIL HFA;VENTOLIN HFA) 108 (90 BASE) MCG/ACT inhaler Inhale 2 puffs into the lungs every 4 (four) hours as  needed. For shortness of breath.  1 Inhaler  2  . finasteride (PROSCAR) 5 MG tablet Take 1 tablet (5 mg total) by mouth at bedtime.  30 tablet  1  . Fluticasone-Salmeterol (ADVAIR) 250-50 MCG/DOSE AEPB Inhale 1 puff into the lungs every 12 (twelve) hours.      Marland Kitchen HYDROcodone-acetaminophen (NORCO) 5-325 MG per tablet Take 1 tablet by mouth every 6 (six) hours as needed for pain.  30 tablet  0  . lidocaine-prilocaine (EMLA) cream Apply 1 application topically daily as needed. Applies to port-a-cath site one hour prior to needle stick.      Marland Kitchen ondansetron (ZOFRAN) 8 MG tablet Take 8 mg by mouth 2 (two) times daily. He is to take two times a day starting the day after chemo for 3 days. Then take two times a day as needed for nausea or vomiting.      . prochlorperazine (COMPAZINE) 10 MG tablet Take 10 mg by mouth every 6 (six) hours as needed. For nausea.       No current facility-administered medications for this visit.   Facility-Administered Medications Ordered in Other Visits  Medication Dose Route Frequency Provider Last Rate Last Dose  . sodium chloride 0.9 % injection 10 mL  10 mL Intravenous PRN Lennis Buzzy Han, MD   10 mL at 11/28/12 0931    ALLERGIES:  has No Known Allergies.  REVIEW OF SYSTEMS:  The rest of  the 14-point review of system was negative.   Filed Vitals:   11/28/12 0857  BP: 123/81  Pulse: 75  Temp: 100.5 F (38.1 C)  Resp: 18   Wt Readings from Last 3 Encounters:  11/28/12 141 lb 6.4 oz (64.139 kg)  07/15/12 145 lb 1.6 oz (65.817 kg)  07/15/12 145 lb (65.772 kg)   ECOG Performance status: 1-2  PHYSICAL EXAMINATION:   General:  Thin-appearing man, in no acute distress.  Eyes:  no scleral icterus.  ENT: There was no longer any residual mass in the left tonsil. However, the left tonsillar fossa was thickened compared to the right side. Neck was without thyromegaly.  Lymphatics:  Negative cervical, supraclavicular or axillary adenopathy.  Respiratory: lungs were  clear bilaterally without wheezing or crackles.  Cardiovascular:  Regular rate and rhythm, S1/S2, without murmur, rub or gallop.  There was no pedal edema.  GI:  abdomen was soft, flat, nontender, nondistended, without organomegaly.  Muscoloskeletal:  no spinal tenderness of palpation of vertebral spine.  Skin exam was without echymosis, petichae.  Neuro exam was nonfocal.  Patient was able to get on and off exam table without assistance.  Gait was normal.  Patient was alerted and oriented.  Attention was good.   Language was appropriate.  Mood was normal without depression.  Speech was not pressured.  Thought content was not tangential.      LABORATORY/RADIOLOGY DATA:  Lab Results  Component Value Date   WBC 3.2* 11/28/2012   HGB 16.0 11/28/2012   HCT 46.1 11/28/2012   PLT 160 11/28/2012   GLUCOSE 93 11/28/2012   ALKPHOS 56 11/28/2012   ALT 12 11/28/2012   AST 16 11/28/2012   NA 140 11/28/2012   K 4.2 11/28/2012   CL 107 07/11/2012   CREATININE 0.9 11/28/2012   BUN 8.2 11/28/2012   CO2 24 11/28/2012   INR 0.97 04/15/2012      ASSESSMENT AND PLAN:   1. History of stage II colon cancer; s/p colectomy in 2010. He is supposedly in remission. His last colonoscopy in 2012 was reportedly negative but is due for one more in 2014. He plans to contact his Gastroenterologist to schedule this.  2. History of bladder cancer in 2010; with local recurrence in 2013.  He follows with Urology.   3. High grade follicular lymphoma of left tonsil; IPI of 1 (or low risk)  - He is status post 4 cycles of chemotherapy R. CHOP.  He achieved complete radiographic response. He had grade 1-2 fatigue, grade 1 anemia, grade 2 anorexia, weight loss. Side effects have now resolved. He also received consolidative XRT. I discussed with Kenneth Reed and his sister that there is no role for surveillance CT scan. However if he has any focal symptoms such as dysphagia, odynophagia, node swelling, constitutional symptom, we will obtain  a followup scan at that time. I arranged for him to have his Port-A-Cath flush every 6-8 weeks for one year. In one year, we may consider removing his Port-A-Cath it has no recurrent disease.   4.  Slight anemia: Hgb has normalized today.  5.  Nausea/vomiting: Resolved.   6.  fatigue, due to chemotherapy. Improved off chemo.  7.  anorexia, weight loss: Improved off chemo.  8.  followup: In about 4 months.    The length of time of the face-to-face encounter was 15 minutes. More than 50% of time was spent counseling and coordination of care.

## 2012-11-28 NOTE — Patient Instructions (Signed)
Today you had your port flushed. Continue to have flushed every 6-8 weeks 

## 2012-11-28 NOTE — Telephone Encounter (Signed)
Gave pt appt for lab, flushed and MD on December 2014

## 2013-01-06 ENCOUNTER — Telehealth: Payer: Self-pay | Admitting: Hematology and Oncology

## 2013-01-06 ENCOUNTER — Telehealth: Payer: Self-pay | Admitting: *Deleted

## 2013-01-06 NOTE — Telephone Encounter (Signed)
Pt called with c/o right side throat swelling, difficulty with swallowing solids x1 day. Pt denies shortness of breath, chest pain and fever. Reviewed with Dr. Bertis Ruddy who advised pt to come in 10/1 at 0830. Notified pt to arrive at 0815. Pt verbalized understanding. No further concerns. POF to scheduling.

## 2013-01-06 NOTE — Telephone Encounter (Signed)
Added appt for 10/1 w/NG. D/t/pt aware per 9/30 pof

## 2013-01-07 ENCOUNTER — Ambulatory Visit (HOSPITAL_BASED_OUTPATIENT_CLINIC_OR_DEPARTMENT_OTHER): Payer: Medicare Other | Admitting: Hematology and Oncology

## 2013-01-07 ENCOUNTER — Encounter: Payer: Self-pay | Admitting: Hematology and Oncology

## 2013-01-07 ENCOUNTER — Telehealth: Payer: Self-pay | Admitting: Hematology and Oncology

## 2013-01-07 ENCOUNTER — Ambulatory Visit (HOSPITAL_BASED_OUTPATIENT_CLINIC_OR_DEPARTMENT_OTHER): Payer: Medicare Other | Admitting: Lab

## 2013-01-07 VITALS — BP 136/85 | HR 101 | Temp 98.7°F | Resp 18 | Ht 73.0 in | Wt 137.9 lb

## 2013-01-07 DIAGNOSIS — Z85038 Personal history of other malignant neoplasm of large intestine: Secondary | ICD-10-CM

## 2013-01-07 DIAGNOSIS — R5383 Other fatigue: Secondary | ICD-10-CM

## 2013-01-07 DIAGNOSIS — C8581 Other specified types of non-Hodgkin lymphoma, lymph nodes of head, face, and neck: Secondary | ICD-10-CM

## 2013-01-07 DIAGNOSIS — Z23 Encounter for immunization: Secondary | ICD-10-CM

## 2013-01-07 DIAGNOSIS — C8599 Non-Hodgkin lymphoma, unspecified, extranodal and solid organ sites: Secondary | ICD-10-CM

## 2013-01-07 DIAGNOSIS — R634 Abnormal weight loss: Secondary | ICD-10-CM

## 2013-01-07 DIAGNOSIS — Z8551 Personal history of malignant neoplasm of bladder: Secondary | ICD-10-CM

## 2013-01-07 HISTORY — DX: Personal history of other malignant neoplasm of large intestine: Z85.038

## 2013-01-07 LAB — CBC WITH DIFFERENTIAL/PLATELET
BASO%: 0.8 % (ref 0.0–2.0)
HCT: 44.8 % (ref 38.4–49.9)
HGB: 16.1 g/dL (ref 13.0–17.1)
LYMPH%: 15.1 % (ref 14.0–49.0)
MCH: 33.5 pg — ABNORMAL HIGH (ref 27.2–33.4)
MCHC: 35.9 g/dL (ref 32.0–36.0)
MCV: 93.2 fL (ref 79.3–98.0)
MONO#: 0.3 10*3/uL (ref 0.1–0.9)
MONO%: 6.8 % (ref 0.0–14.0)
NEUT%: 76 % — ABNORMAL HIGH (ref 39.0–75.0)
Platelets: 184 10*3/uL (ref 140–400)
RBC: 4.81 10*6/uL (ref 4.20–5.82)
WBC: 3.9 10*3/uL — ABNORMAL LOW (ref 4.0–10.3)

## 2013-01-07 LAB — COMPREHENSIVE METABOLIC PANEL (CC13)
ALT: 13 U/L (ref 0–55)
Alkaline Phosphatase: 54 U/L (ref 40–150)
BUN: 10.2 mg/dL (ref 7.0–26.0)
CO2: 25 mEq/L (ref 22–29)
Creatinine: 0.9 mg/dL (ref 0.7–1.3)
Sodium: 139 mEq/L (ref 136–145)
Total Bilirubin: 1.74 mg/dL — ABNORMAL HIGH (ref 0.20–1.20)
Total Protein: 7.2 g/dL (ref 6.4–8.3)

## 2013-01-07 LAB — TSH CHCC: TSH: 2.191 m(IU)/L (ref 0.320–4.118)

## 2013-01-07 MED ORDER — INFLUENZA VAC SPLIT QUAD 0.5 ML IM SUSP
0.5000 mL | Freq: Once | INTRAMUSCULAR | Status: AC
  Administered 2013-01-07: 0.5 mL via INTRAMUSCULAR
  Filled 2013-01-07: qty 0.5

## 2013-01-07 MED ORDER — INFLUENZA VAC SPLIT QUAD 0.5 ML IM SUSP
0.5000 mL | INTRAMUSCULAR | Status: DC
  Filled 2013-01-07: qty 0.5

## 2013-01-07 NOTE — Telephone Encounter (Signed)
gv and printed appt sched and avs for pt for OCT and DEC

## 2013-01-07 NOTE — Progress Notes (Signed)
[facility] OFFICE PROGRESS NOTE  Rudi Heap, MD  DIAGNOSIS:  #1 history of follicular lymphoma of the left tonsil, high grade, status post R. CHOP chemotherapy and radiation therapy #2 history of bladder cancer in 2010 and local recurrence in 2013, on observation #3 stage II colon cancer status post hemicolectomy in 2010 without adjuvant treatment, on observation  SUMMARY OF ONCOLOGIC HISTORY: He had history of colon and bladder cancer and was cared for up in [facility].He was found by his PCP to have a large left tonsil mass. He was referred to Dr. Jenne Pane who performed biopsy on 03/26/12 with pathology care # [phone] consistent with high grade non hodgkin B-cell lymphoma (CD20+; CD10+, BCL-6 positive; Ki-67 was 75%. Between January to March 2014, he received 4 cycles of R. CHOP chemotherapy and 3 cycles of intrathecal chemotherapy  INTERVAL HISTORY: Kenneth Reed 67 y.o. male returns for further followup related to his history of lymphoma. Over the past 6 weeks, he has a sensation of sore throat which is persistent since his radiation therapy. He is a complaint of palpable lymph node in the right side of his neck. He has poor appetite and has lost some weight since he was seen here. He denies any recent night sweats. He denies any hematuria or hematochezia. He is due for a colonoscopy but has not scheduled that yet.  I have reviewed the past medical history, past surgical history, social history and family history with the patient and they are unchanged from previous note.  ALLERGIES:  has No Known Allergies.  MEDICATIONS: Current outpatient prescriptions:albuterol (PROVENTIL HFA;VENTOLIN HFA) 108 (90 BASE) MCG/ACT inhaler, Inhale 2 puffs into the lungs every 4 (four) hours as needed. For shortness of breath., Disp: 1 Inhaler, Rfl: 2;  finasteride (PROSCAR) 5 MG tablet, Take 1 tablet (5 mg total) by mouth at bedtime., Disp: 30 tablet, Rfl: 1;  Fluticasone-Salmeterol  (ADVAIR) 250-50 MCG/DOSE AEPB, Inhale 1 puff into the lungs every 12 (twelve) hours., Disp: , Rfl:  lidocaine-prilocaine (EMLA) cream, Apply 1 application topically daily as needed. Applies to port-a-cath site one hour prior to needle stick., Disp: , Rfl: ;  HYDROcodone-acetaminophen (NORCO) 5-325 MG per tablet, Take 1 tablet by mouth every 6 (six) hours as needed for pain., Disp: 30 tablet, Rfl: 0 ondansetron (ZOFRAN) 8 MG tablet, Take 8 mg by mouth 2 (two) times daily. He is to take two times a day starting the day after chemo for 3 days. Then take two times a day as needed for nausea or vomiting., Disp: , Rfl: ;  prochlorperazine (COMPAZINE) 10 MG tablet, Take 10 mg by mouth every 6 (six) hours as needed. For nausea., Disp: , Rfl:  Current facility-administered medications:[START ON 01/08/2013] influenza vac split quadrivalent PF (FLUARIX) injection 0.5 mL, 0.5 mL, Intramuscular, Tomorrow-1000, Omya Winfield, MD  REVIEW OF SYSTEMS:   Constitutional: Denies fevers, chills  Eyes: Denies blurriness of vision Ears, nose, mouth, throat, and face: No dysphagia Respiratory: Denies cough, dyspnea or wheezes Cardiovascular: Denies palpitation, chest discomfort or lower extremity swelling Gastrointestinal:  Denies nausea, heartburn or change in bowel habits Skin: Denies abnormal skin rashes Lymphatics: Denies new lymphadenopathy or easy bruising Neurological:Denies numbness, tingling or new weaknesses Behavioral/Psych: Mood is stable, no new changes  All other systems were reviewed with the patient and are negative.  PHYSICAL EXAMINATION: ECOG PERFORMANCE STATUS: 1 - Symptomatic but completely ambulatory  Filed Vitals:   01/07/13 0853  BP: 136/85  Pulse: 101  Temp: 98.7 F (37.1  C)  Resp: 18   Filed Weights   01/07/13 0853  Weight: 137 lb 14.4 oz (62.551 kg)    GENERAL:alert, no distress and comfortable SKIN: skin color, texture, turgor are normal, no rashes or significant lesions EYES:  normal, Conjunctiva are pink and non-injected, sclera clear OROPHARYNX:no exudate, no erythema and lips, buccal mucosa, and tongue normal  NECK: supple, thyroid normal size, non-tender, without nodularity LYMPH:  palpable lymphadenopathy on both sides of his neck, and one in the right cervical region measure approximately 2 cm in size and it is firm to palpation but nontender, no palpable axillary or inguinal lymph nodes LUNGS: clear to auscultation and percussion with normal breathing effort HEART: regular rate & rhythm and no murmurs and no lower extremity edema ABDOMEN:abdomen soft, non-tender and normal bowel sounds Musculoskeletal:no cyanosis of digits and no clubbing  NEURO: alert & oriented x 3 with fluent speech, no focal motor/sensory deficits  LABORATORY DATA:  I have reviewed the data as listed    Component Value Date/Time   NA 140 11/28/2012 0843   K 4.2 11/28/2012 0843   CL 107 07/11/2012 0817   CO2 24 11/28/2012 0843   GLUCOSE 93 11/28/2012 0843   GLUCOSE 96 07/11/2012 0817   BUN 8.2 11/28/2012 0843   CREATININE 0.9 11/28/2012 0843   CALCIUM 9.3 11/28/2012 0843   PROT 6.9 11/28/2012 0843   ALBUMIN 3.8 11/28/2012 0843   AST 16 11/28/2012 0843   ALT 12 11/28/2012 0843   ALKPHOS 56 11/28/2012 0843   BILITOT 1.30* 11/28/2012 0843    I No results found for this basename: SPEP, UPEP,  kappa and lambda light chains    Lab Results  Component Value Date   WBC 3.2* 11/28/2012   NEUTROABS 2.2 11/28/2012   HGB 16.0 11/28/2012   HCT 46.1 11/28/2012   MCV 93.9 11/28/2012   PLT 160 11/28/2012      Chemistry      Component Value Date/Time   NA 140 11/28/2012 0843   K 4.2 11/28/2012 0843   CL 107 07/11/2012 0817   CO2 24 11/28/2012 0843   BUN 8.2 11/28/2012 0843   CREATININE 0.9 11/28/2012 0843      Component Value Date/Time   CALCIUM 9.3 11/28/2012 0843   ALKPHOS 56 11/28/2012 0843   AST 16 11/28/2012 0843   ALT 12 11/28/2012 0843   BILITOT 1.30* 11/28/2012 0843     ASSESSMENT: History of  high-grade follicular lymphoma, palpable lymphadenopathy, recent weight loss  PLAN:  #1 history of high-grade lymphoma, IPI score of 1 The patient has completed treatment. His last scan in April showed no evidence of disease. However I am concerned about his weight loss and palpable lymphadenopathy in the neck, worrisome for possible disease recurrence. I recommend repeat blood work today and PET CT scan to restage him. He is in agreement. #2 history of colon cancer stage II I recommend the patient to contact his gastroenterologist for another colonoscopy to rule out disease recurrence. We will older a CEA today. #3 bladder cancer He has no evidence of recurrence on clinical exam. His last cystoscopy and urology followup in June showed no evidence of recurrence. #4 preventive care I recommend influenza vaccination and we will administer in the clinic today. All questions were answered. The patient knows to call the clinic with any problems, questions or concerns. We can certainly see the patient much sooner if necessary. No barriers to learning was detected.    [facility], Mallisa Alameda, MD 01/07/2013 9:36  AM

## 2013-01-07 NOTE — Progress Notes (Signed)
Pt received flu shot x 1 on 10/1.

## 2013-01-08 LAB — IGG, IGA, IGM
IgA: 239 mg/dL (ref 68–379)
IgG (Immunoglobin G), Serum: 1240 mg/dL (ref 650–1600)
IgM, Serum: 136 mg/dL (ref 41–251)

## 2013-01-08 LAB — CEA: CEA: <0.5 ng/mL (ref 0.0–5.0)

## 2013-01-08 LAB — T4, FREE: Free T4: 1.18 ng/dL (ref 0.80–1.80)

## 2013-01-12 ENCOUNTER — Telehealth: Payer: Self-pay | Admitting: *Deleted

## 2013-01-12 NOTE — Telephone Encounter (Signed)
Daughter called for lab results.  Per Dr. Bertis Ruddy, labs stable and improving.  Reviewed labs w/ daughter. She verbalized understanding.

## 2013-01-20 ENCOUNTER — Encounter (HOSPITAL_COMMUNITY)
Admission: RE | Admit: 2013-01-20 | Discharge: 2013-01-20 | Disposition: A | Discharge status: Home / Self Care | Payer: Medicare Other | Source: Ambulatory Visit | Attending: Hematology and Oncology | Admitting: Hematology and Oncology

## 2013-01-20 DIAGNOSIS — C8589 Other specified types of non-Hodgkin lymphoma, extranodal and solid organ sites: Secondary | ICD-10-CM | POA: Insufficient documentation

## 2013-01-20 DIAGNOSIS — C8590 Non-Hodgkin lymphoma, unspecified, unspecified site: Secondary | ICD-10-CM

## 2013-01-20 DIAGNOSIS — R229 Localized swelling, mass and lump, unspecified: Secondary | ICD-10-CM | POA: Insufficient documentation

## 2013-01-20 DIAGNOSIS — N400 Enlarged prostate without lower urinary tract symptoms: Secondary | ICD-10-CM | POA: Insufficient documentation

## 2013-01-20 DIAGNOSIS — R918 Other nonspecific abnormal finding of lung field: Secondary | ICD-10-CM | POA: Insufficient documentation

## 2013-01-20 LAB — GLUCOSE, CAPILLARY: Glucose-Capillary: 99 mg/dL (ref 70–99)

## 2013-01-20 MED ORDER — FLUDEOXYGLUCOSE F - 18 (FDG) INJECTION
15.1000 | Freq: Once | INTRAVENOUS | Status: AC | PRN
  Administered 2013-01-20: 15.1 via INTRAVENOUS

## 2013-01-21 ENCOUNTER — Telehealth: Payer: Self-pay | Admitting: Hematology and Oncology

## 2013-01-21 ENCOUNTER — Other Ambulatory Visit: Payer: Self-pay | Admitting: Emergency Medicine

## 2013-01-21 ENCOUNTER — Encounter: Payer: Self-pay | Admitting: *Deleted

## 2013-01-21 ENCOUNTER — Ambulatory Visit (HOSPITAL_BASED_OUTPATIENT_CLINIC_OR_DEPARTMENT_OTHER): Payer: Medicare Other | Admitting: Hematology and Oncology

## 2013-01-21 ENCOUNTER — Encounter: Payer: Self-pay | Admitting: Hematology and Oncology

## 2013-01-21 VITALS — BP 128/74 | HR 93 | Temp 97.6°F | Ht 73.0 in | Wt 138.3 lb

## 2013-01-21 DIAGNOSIS — C8599 Non-Hodgkin lymphoma, unspecified, extranodal and solid organ sites: Secondary | ICD-10-CM

## 2013-01-21 DIAGNOSIS — Z85038 Personal history of other malignant neoplasm of large intestine: Secondary | ICD-10-CM

## 2013-01-21 DIAGNOSIS — C8581 Other specified types of non-Hodgkin lymphoma, lymph nodes of head, face, and neck: Secondary | ICD-10-CM

## 2013-01-21 DIAGNOSIS — C679 Malignant neoplasm of bladder, unspecified: Secondary | ICD-10-CM

## 2013-01-21 DIAGNOSIS — R220 Localized swelling, mass and lump, head: Secondary | ICD-10-CM

## 2013-01-21 DIAGNOSIS — Z95828 Presence of other vascular implants and grafts: Secondary | ICD-10-CM

## 2013-01-21 MED ORDER — SODIUM CHLORIDE 0.9 % IJ SOLN
10.0000 mL | INTRAMUSCULAR | Status: DC | PRN
  Administered 2013-01-21: 10 mL via INTRAVENOUS
  Filled 2013-01-21: qty 10

## 2013-01-21 MED ORDER — HEPARIN SOD (PORK) LOCK FLUSH 100 UNIT/ML IV SOLN
500.0000 [IU] | Freq: Once | INTRAVENOUS | Status: AC
  Administered 2013-01-21: 500 [IU] via INTRAVENOUS
  Filled 2013-01-21: qty 5

## 2013-01-21 NOTE — Telephone Encounter (Signed)
gv adn printed appt sched and avs for pt for NOV thru Feb 2015

## 2013-01-21 NOTE — Progress Notes (Signed)
[facility] OFFICE PROGRESS NOTE  Patient Care Team: Ernestina Penna, MD as PCP - General (Family Medicine) Christia Reading, MD as Attending Physician (Otolaryngology) Exie Parody, MD (Hematology and Oncology) Erskine Speed (General Surgery) Dennie Maizes, MD as Attending Physician (Urology)  DIAGNOSIS:  #1 history of follicular lymphoma of the left tonsil, high grade, status post R. CHOP chemotherapy and radiation therapy #2 history of bladder cancer in 2010 and local recurrence in 2013, on observation #3 stage II colon cancer status post hemicolectomy in 2010 without adjuvant treatment, on observation  SUMMARY OF ONCOLOGIC HISTORY: He had history of colon and bladder cancer and was cared for up in [facility].He was found by his PCP to have a large left tonsil mass. He was referred to Dr. Jenne Pane who performed biopsy on 03/26/12 with pathology care # [phone] consistent with high grade non hodgkin B-cell lymphoma (CD20+; CD10+, BCL-6 positive; Ki-67 was 75%. Between January to March 2014, he received 4 cycles of R. CHOP chemotherapy and 3 cycles of intrathecal chemotherapy  INTERVAL HISTORY: Kenneth Reed 67 y.o. male returns for followup on test results. When I saw him recently, he complained of sore throat and persistent presence of a lump in the right side of his neck. He also has a lump at the back of his head which treating with treatment and now has grown back. Denies any further weight loss. He denies any recent fever, chills, night sweats or abnormal weight loss  I have reviewed the past medical history, past surgical history, social history and family history with the patient and they are unchanged from previous note.  ALLERGIES:  has No Known Allergies.  MEDICATIONS:  Current Outpatient Prescriptions  Medication Sig Dispense Refill  . albuterol (PROVENTIL HFA;VENTOLIN HFA) 108 (90 BASE) MCG/ACT inhaler Inhale 2 puffs into the lungs every 4 (four) hours as  needed. For shortness of breath.  1 Inhaler  2  . finasteride (PROSCAR) 5 MG tablet Take 1 tablet (5 mg total) by mouth at bedtime.  30 tablet  1  . Fluticasone-Salmeterol (ADVAIR) 250-50 MCG/DOSE AEPB Inhale 1 puff into the lungs every 12 (twelve) hours.      Marland Kitchen HYDROcodone-acetaminophen (NORCO) 5-325 MG per tablet Take 1 tablet by mouth every 6 (six) hours as needed for pain.  30 tablet  0  . lidocaine-prilocaine (EMLA) cream Apply 1 application topically daily as needed. Applies to port-a-cath site one hour prior to needle stick.      Marland Kitchen ondansetron (ZOFRAN) 8 MG tablet Take 8 mg by mouth 2 (two) times daily. He is to take two times a day starting the day after chemo for 3 days. Then take two times a day as needed for nausea or vomiting.      . prochlorperazine (COMPAZINE) 10 MG tablet Take 10 mg by mouth every 6 (six) hours as needed. For nausea.       Current Facility-Administered Medications  Medication Dose Route Frequency Provider Last Rate Last Dose  . sodium chloride 0.9 % injection 10 mL  10 mL Intravenous PRN Artis Delay, MD   10 mL at 01/21/13 1523    REVIEW OF SYSTEMS:   Constitutional: Denies fevers, chills or abnormal weight loss Behavioral/Psych: Mood is stable, no new changes  All other systems were reviewed with the patient and are negative.  PHYSICAL EXAMINATION: ECOG PERFORMANCE STATUS: 0 - Asymptomatic  Filed Vitals:   01/21/13 1501  BP: 128/74  Pulse: 93  Temp: 97.6 F (36.4  C)   Filed Weights   01/21/13 1501  Weight: 138 lb 4.8 oz (62.732 kg)    GENERAL:alert, no distress and comfortable SKIN: skin color, texture, turgor are normal, no rashes or significant lesions. There is a nodule at the back of his right occipital region measuring approximately 1 cm in size which is nontender to touch EYES: normal, Conjunctiva are pink and non-injected, sclera clear OROPHARYNX:no exudate, no erythema and lips, buccal mucosa, and tongue normal  NECK: supple, thyroid normal  size, non-tender, without nodularity LYMPH:  There is a palpable lump in the right submandibular region which is somewhat firm to touch. No other lymphadenopathy elsewhere NEURO: alert & oriented x 3 with fluent speech, no focal motor/sensory deficits  LABORATORY DATA:  I have reviewed the data as listed    Component Value Date/Time   NA 139 01/07/2013 0926   K 3.9 01/07/2013 0926   CL 107 07/11/2012 0817   CO2 25 01/07/2013 0926   GLUCOSE 119 01/07/2013 0926   GLUCOSE 96 07/11/2012 0817   BUN 10.2 01/07/2013 0926   CREATININE 0.9 01/07/2013 0926   CALCIUM 9.4 01/07/2013 0926   PROT 7.2 01/07/2013 0926   ALBUMIN 4.1 01/07/2013 0926   AST 22 01/07/2013 0926   ALT 13 01/07/2013 0926   ALKPHOS 54 01/07/2013 0926   BILITOT 1.74* 01/07/2013 0926    No results found for this basename: SPEP,  UPEP,   kappa and lambda light chains    Lab Results  Component Value Date   WBC 3.9* 01/07/2013   NEUTROABS 2.9 01/07/2013   HGB 16.1 01/07/2013   HCT 44.8 01/07/2013   MCV 93.2 01/07/2013   PLT 184 01/07/2013      Chemistry      Component Value Date/Time   NA 139 01/07/2013 0926   K 3.9 01/07/2013 0926   CL 107 07/11/2012 0817   CO2 25 01/07/2013 0926   BUN 10.2 01/07/2013 0926   CREATININE 0.9 01/07/2013 0926      Component Value Date/Time   CALCIUM 9.4 01/07/2013 0926   ALKPHOS 54 01/07/2013 0926   AST 22 01/07/2013 0926   ALT 13 01/07/2013 0926   BILITOT 1.74* 01/07/2013 0926       RADIOGRAPHIC STUDIES: I have personally reviewed the radiological images as listed and agreed with the findings in the report. I reviewed the imaging study with the patient and his wife Nm Pet Image Restag (ps) Skull Base To Thigh  01/20/2013   CLINICAL DATA:  Subsequent treatment strategy for restaging of non-Hodgkin's lymphoma of the tonsil.  EXAM: NUCLEAR MEDICINE PET SKULL BASE TO THIGH  FASTING BLOOD GLUCOSE:  Value:  99 mg/dl  TECHNIQUE: 16.1 mCi W-96 FDG was injected intravenously. CT data was obtained and used for  attenuation correction and anatomic localization only. (This was not acquired as a diagnostic CT examination.) Additional exam technical data entered on technologist worksheet.  COMPARISON:  Of 07/09/2012  FINDINGS: NECK  No abnormal nodal or mucosal hypermetabolism within the neck. A subcutaneous nodule within the high right-sided and neck measures 1.4 cm and a SUV 5.1 on image 20/series 2. On the prior exam, this measured 1.4 cm and a S.U.V. max of 4.0  Just inferior to this, within the posterior soft tissues of the left side of the neck is a focus of subtle hypermetabolism without CT correlate. This measures a S.U.V. max of 2.5, including on image 21/ series 2.  CHEST  No areas of abnormal hypermetabolism.  ABDOMEN/PELVIS  No areas of abnormal hypermetabolism.  SKELETON  Left rotator cuff arthropathy.  CT IMAGES PERFORMED FOR ATTENUATION CORRECTION  Mucosal thickening of the left sphenoid sinus and maxillary sinus. Minimal right maxillary sinus mucosal thickening as well. This is chronic.  A right-sided Port-A-Cath which terminates at the cavoatrial junction. Calcified bilateral hilar lymph nodes which are consistent with old granulomatous disease. Calcified bilateral lung nodules are also consistent with old granulomatous disease. Prostatomegaly with suggestion of mild bladder wall thickening, likely secondary. L5 pars defects with trace malalignment at the L5-S1 level.  IMPRESSION: 1. No evidence of residual or recurrent hypermetabolic lymphoma. 2. Similar subcutaneous nodule about the high right neck. Favored to represent a sebaceous cyst. An adjacent left cervical subcutaneous focus of hypermetabolism is favored to be physiologic and is without CT correlate. Recommend attention on follow-up.   Electronically Signed   By: Jeronimo Greaves M.D.   On: 01/20/2013 15:23   ASSESSMENT:  PET positive abnormal lump on the back of his head, and mild uptake at abnormal palpable lymph node on the right side of the  neck  PLAN:  #1 PET positive lymph node in the neck and the back of his head I recommend referring him back to the ENT for further evaluation. He may need further workup and rule out another primary in the head and neck region and may need a fine-needle aspirate and biopsy #2 history of colon cancer His CEA was negative. His PET CT scan did not show any evidence of disease recurrence #3 history of bladder cancer The PET scan is not the best test to look for this however I did not see any abnormalities in his genitourinary region although again, the best test to look for this #4 recent weight loss I'm uncertain whether he has another cancer primary as mentioned above. Recommend him to increase oral intake. This is a very rare presentation of an abnormal lymphoma and the patient with multiple different primaries. Will present his case at next tumor Board conference for further discussion.  Orders Placed This Encounter  Procedures  . CBC with Differential    Standing Status: Future     Number of Occurrences:      Standing Expiration Date: 10/13/2013  . Comprehensive metabolic panel    Standing Status: Future     Number of Occurrences:      Standing Expiration Date: 01/21/2014  . Lactate dehydrogenase    Standing Status: Future     Number of Occurrences:      Standing Expiration Date: 01/21/2014  . CEA    Standing Status: Future     Number of Occurrences:      Standing Expiration Date: 01/21/2014   All questions were answered. The patient knows to call the clinic with any problems, questions or concerns. No barriers to learning was detected.    [facility], Vedh Ptacek, MD 01/21/2013 3:27 PM

## 2013-01-21 NOTE — Progress Notes (Signed)
Faxed Dr. Bertis Ruddy note from today over to dr. Jenne Pane at fax [phone].

## 2013-02-18 ENCOUNTER — Ambulatory Visit (HOSPITAL_BASED_OUTPATIENT_CLINIC_OR_DEPARTMENT_OTHER): Payer: Medicare Other

## 2013-02-18 VITALS — BP 119/79 | HR 94 | Temp 98.0°F | Resp 18

## 2013-02-18 DIAGNOSIS — C8581 Other specified types of non-Hodgkin lymphoma, lymph nodes of head, face, and neck: Secondary | ICD-10-CM

## 2013-02-18 DIAGNOSIS — Z452 Encounter for adjustment and management of vascular access device: Secondary | ICD-10-CM

## 2013-02-18 DIAGNOSIS — C8591 Non-Hodgkin lymphoma, unspecified, lymph nodes of head, face, and neck: Secondary | ICD-10-CM

## 2013-02-18 MED ORDER — HEPARIN SOD (PORK) LOCK FLUSH 100 UNIT/ML IV SOLN
500.0000 [IU] | Freq: Once | INTRAVENOUS | Status: AC
  Administered 2013-02-18: 500 [IU] via INTRAVENOUS
  Filled 2013-02-18: qty 5

## 2013-02-18 MED ORDER — SODIUM CHLORIDE 0.9 % IJ SOLN
10.0000 mL | Freq: Once | INTRAMUSCULAR | Status: AC
  Administered 2013-02-18: 10 mL via INTRAVENOUS
  Filled 2013-02-18: qty 10

## 2013-02-18 NOTE — Patient Instructions (Signed)

## 2013-03-30 ENCOUNTER — Ambulatory Visit: Payer: Medicare Other

## 2013-03-30 ENCOUNTER — Other Ambulatory Visit: Payer: Medicare Other | Admitting: Lab

## 2013-03-30 ENCOUNTER — Telehealth: Payer: Self-pay | Admitting: Hematology and Oncology

## 2013-03-30 NOTE — Telephone Encounter (Signed)
pt came in on wrong day. per pt 1/7 appts moved to 1/8. pt has new d.t and was given new schedule

## 2013-04-15 ENCOUNTER — Other Ambulatory Visit: Payer: Medicare Other | Admitting: Lab

## 2013-04-15 ENCOUNTER — Ambulatory Visit: Payer: Medicare Other | Admitting: Hematology and Oncology

## 2013-04-15 ENCOUNTER — Ambulatory Visit: Payer: Medicare Other

## 2013-04-15 ENCOUNTER — Other Ambulatory Visit: Payer: Medicare Other

## 2013-04-16 ENCOUNTER — Ambulatory Visit (HOSPITAL_BASED_OUTPATIENT_CLINIC_OR_DEPARTMENT_OTHER): Payer: Medicare Other

## 2013-04-16 ENCOUNTER — Ambulatory Visit: Payer: Medicare Other | Admitting: Hematology and Oncology

## 2013-04-16 ENCOUNTER — Other Ambulatory Visit (HOSPITAL_BASED_OUTPATIENT_CLINIC_OR_DEPARTMENT_OTHER): Payer: Medicare Other

## 2013-04-16 ENCOUNTER — Encounter: Payer: Self-pay | Admitting: Hematology and Oncology

## 2013-04-16 ENCOUNTER — Telehealth: Payer: Self-pay | Admitting: Hematology and Oncology

## 2013-04-16 VITALS — BP 113/74 | HR 85 | Temp 97.2°F

## 2013-04-16 VITALS — BP 118/77 | HR 90 | Temp 98.0°F | Resp 18 | Ht 73.0 in | Wt 138.9 lb

## 2013-04-16 DIAGNOSIS — C8599 Non-Hodgkin lymphoma, unspecified, extranodal and solid organ sites: Secondary | ICD-10-CM

## 2013-04-16 DIAGNOSIS — C8291 Follicular lymphoma, unspecified, lymph nodes of head, face, and neck: Secondary | ICD-10-CM

## 2013-04-16 DIAGNOSIS — Z95828 Presence of other vascular implants and grafts: Secondary | ICD-10-CM

## 2013-04-16 DIAGNOSIS — D72819 Decreased white blood cell count, unspecified: Secondary | ICD-10-CM

## 2013-04-16 DIAGNOSIS — Z85038 Personal history of other malignant neoplasm of large intestine: Secondary | ICD-10-CM

## 2013-04-16 DIAGNOSIS — R5383 Other fatigue: Secondary | ICD-10-CM

## 2013-04-16 DIAGNOSIS — C8591 Non-Hodgkin lymphoma, unspecified, lymph nodes of head, face, and neck: Secondary | ICD-10-CM

## 2013-04-16 LAB — CBC WITH DIFFERENTIAL/PLATELET
BASO%: 1.1 % (ref 0.0–2.0)
BASOS ABS: 0 10*3/uL (ref 0.0–0.1)
EOS ABS: 0.1 10*3/uL (ref 0.0–0.5)
EOS%: 2 % (ref 0.0–7.0)
HCT: 42.2 % (ref 38.4–49.9)
HGB: 14.7 g/dL (ref 13.0–17.1)
LYMPH%: 21.4 % (ref 14.0–49.0)
MCH: 33.6 pg — ABNORMAL HIGH (ref 27.2–33.4)
MCHC: 34.9 g/dL (ref 32.0–36.0)
MCV: 96.4 fL (ref 79.3–98.0)
MONO#: 0.2 10*3/uL (ref 0.1–0.9)
MONO%: 7.5 % (ref 0.0–14.0)
NEUT%: 68 % (ref 39.0–75.0)
NEUTROS ABS: 2.2 10*3/uL (ref 1.5–6.5)
PLATELETS: 184 10*3/uL (ref 140–400)
RBC: 4.37 10*6/uL (ref 4.20–5.82)
RDW: 12.6 % (ref 11.0–14.6)
WBC: 3.2 10*3/uL — AB (ref 4.0–10.3)
lymph#: 0.7 10*3/uL — ABNORMAL LOW (ref 0.9–3.3)

## 2013-04-16 LAB — COMPREHENSIVE METABOLIC PANEL (CC13)
ALBUMIN: 4 g/dL (ref 3.5–5.0)
ALT: 12 U/L (ref 0–55)
ANION GAP: 12 meq/L — AB (ref 3–11)
AST: 19 U/L (ref 5–34)
Alkaline Phosphatase: 54 U/L (ref 40–150)
BUN: 7.1 mg/dL (ref 7.0–26.0)
CO2: 23 meq/L (ref 22–29)
Calcium: 9 mg/dL (ref 8.4–10.4)
Chloride: 103 mEq/L (ref 98–109)
Creatinine: 0.9 mg/dL (ref 0.7–1.3)
GLUCOSE: 114 mg/dL (ref 70–140)
Potassium: 3.6 mEq/L (ref 3.5–5.1)
SODIUM: 138 meq/L (ref 136–145)
Total Bilirubin: 1.32 mg/dL — ABNORMAL HIGH (ref 0.20–1.20)
Total Protein: 6.9 g/dL (ref 6.4–8.3)

## 2013-04-16 LAB — CEA

## 2013-04-16 LAB — LACTATE DEHYDROGENASE (CC13): LDH: 142 U/L (ref 125–245)

## 2013-04-16 MED ORDER — SODIUM CHLORIDE 0.9 % IJ SOLN
10.0000 mL | INTRAMUSCULAR | Status: DC | PRN
  Administered 2013-04-16: 10 mL via INTRAVENOUS
  Filled 2013-04-16: qty 10

## 2013-04-16 MED ORDER — HEPARIN SOD (PORK) LOCK FLUSH 100 UNIT/ML IV SOLN
500.0000 [IU] | Freq: Once | INTRAVENOUS | Status: AC
  Administered 2013-04-16: 500 [IU] via INTRAVENOUS
  Filled 2013-04-16: qty 5

## 2013-04-16 NOTE — Progress Notes (Signed)
[place] [facility] OFFICE PROGRESS NOTE  Patient Care Team: Chipper Herb, MD as PCP - General (Family Medicine) Melida Quitter, MD as Attending Physician (Otolaryngology) Burke Keels (General Surgery) Miguel Dibble, MD as Attending Physician (Urology)  DIAGNOSIS: High-grade lymphoma status post chemotherapy  SUMMARY OF ONCOLOGIC HISTORY: #1 history of follicular lymphoma of the left tonsil, high grade, status post R. CHOP chemotherapy and radiation therapy #2 history of bladder cancer in 2010 and local recurrence in 2013, on observation #3 stage II colon cancer status post hemicolectomy in 2010 without adjuvant treatment, on observation  INTERVAL HISTORY: Kenneth Reed 68 y.o. male returns for further followup. His doing well. His persistent of a lump in the rest of the neck but that has not changed. He also has a lump in the back of his neck as well. That remain unchanged He denies any recent fever, chills, night sweats or abnormal weight loss  I have reviewed the past medical history, past surgical history, social history and family history with the patient and they are unchanged from previous note.  ALLERGIES:  has No Known Allergies.  MEDICATIONS:  Current Outpatient Prescriptions  Medication Sig Dispense Refill  . albuterol (PROVENTIL HFA;VENTOLIN HFA) 108 (90 BASE) MCG/ACT inhaler Inhale 2 puffs into the lungs every 4 (four) hours as needed. For shortness of breath.  1 Inhaler  2  . finasteride (PROSCAR) 5 MG tablet Take 1 tablet (5 mg total) by mouth at bedtime.  30 tablet  1  . Fluticasone-Salmeterol (ADVAIR) 250-50 MCG/DOSE AEPB Inhale 1 puff into the lungs every 12 (twelve) hours.      . lidocaine-prilocaine (EMLA) cream Apply 1 application topically daily as needed. Applies to port-a-cath site one hour prior to needle stick.      Marland Kitchen ondansetron (ZOFRAN) 8 MG tablet Take 8 mg by mouth 2 (two) times daily. He is to take two times a day starting the day after chemo  for 3 days. Then take two times a day as needed for nausea or vomiting.      . prochlorperazine (COMPAZINE) 10 MG tablet Take 10 mg by mouth every 6 (six) hours as needed. For nausea.       No current facility-administered medications for this visit.    REVIEW OF SYSTEMS:   Constitutional: Denies fevers, chills or abnormal weight loss Eyes: Denies blurriness of vision Ears, nose, mouth, throat, and face: Denies mucositis or sore throat Respiratory: Denies cough, dyspnea or wheezes Cardiovascular: Denies palpitation, chest discomfort or lower extremity swelling Gastrointestinal:  Denies nausea, heartburn or change in bowel habits Skin: Denies abnormal skin rashes Neurological:Denies numbness, tingling or new weaknesses Behavioral/Psych: Mood is stable, no new changes  All other systems were reviewed with the patient and are negative.  PHYSICAL EXAMINATION: ECOG PERFORMANCE STATUS: 1 - Symptomatic but completely ambulatory  Filed Vitals:   04/16/13 0940  BP: 118/77  Pulse: 90  Temp: 98 F (36.7 C)  Resp: 18   Filed Weights   04/16/13 0940  Weight: 138 lb 14.4 oz (63.005 kg)    GENERAL:alert, no distress and comfortable SKIN: skin color, texture, turgor are normal, no rashes or significant lesions EYES: normal, Conjunctiva are pink and non-injected, sclera clear OROPHARYNX:no exudate, no erythema and lips, buccal mucosa, and tongue normal  NECK: supple, thyroid normal size, non-tender, with probable small nodules on the right side of his neck and unchanged compared to previous LYMPH:  no palpable lymphadenopathy in the cervical, axillary or inguinal LUNGS: clear to  auscultation and percussion with normal breathing effort HEART: regular rate & rhythm and no murmurs and no lower extremity edema ABDOMEN:abdomen soft, non-tender and normal bowel sounds Musculoskeletal:no cyanosis of digits and no clubbing  NEURO: alert & oriented x 3 with fluent speech, no focal motor/sensory  deficits  LABORATORY DATA:  I have reviewed the data as listed    Component Value Date/Time   NA 138 04/16/2013 0856   K 3.6 04/16/2013 0856   CL 107 07/11/2012 0817   CO2 23 04/16/2013 0856   GLUCOSE 114 04/16/2013 0856   GLUCOSE 96 07/11/2012 0817   BUN 7.1 04/16/2013 0856   CREATININE 0.9 04/16/2013 0856   CALCIUM 9.0 04/16/2013 0856   PROT 6.9 04/16/2013 0856   ALBUMIN 4.0 04/16/2013 0856   AST 19 04/16/2013 0856   ALT 12 04/16/2013 0856   ALKPHOS 54 04/16/2013 0856   BILITOT 1.32* 04/16/2013 0856    No results found for this basename: SPEP,  UPEP,   kappa and lambda light chains    Lab Results  Component Value Date   WBC 3.2* 04/16/2013   NEUTROABS 2.2 04/16/2013   HGB 14.7 04/16/2013   HCT 42.2 04/16/2013   MCV 96.4 04/16/2013   PLT 184 04/16/2013      Chemistry      Component Value Date/Time   NA 138 04/16/2013 0856   K 3.6 04/16/2013 0856   CL 107 07/11/2012 0817   CO2 23 04/16/2013 0856   BUN 7.1 04/16/2013 0856   CREATININE 0.9 04/16/2013 0856      Component Value Date/Time   CALCIUM 9.0 04/16/2013 0856   ALKPHOS 54 04/16/2013 0856   AST 19 04/16/2013 0856   ALT 12 04/16/2013 0856   BILITOT 1.32* 04/16/2013 0856     ASSESSMENT & PLAN:  #1 history of follicular lymphoma  The abnormal lump palpable is a sebaceous cysts. I review his scan in the ENT tumor board back in October. I recommend repeat CT scan of the neck in 4 months #2 history of colon cancer His CEA was negative. His PET CT scan did not show any evidence of disease recurrence #3 history of bladder cancer He will continue followup with urologist #4 leukopenia This is likely due to recent treatment. The patient denies recent history of fevers, cough, chills, diarrhea or dysuria. He is asymptomatic from the leukopenia. I will observe for now.    Orders Placed This Encounter  Procedures  . CT Soft Tissue Neck W Contrast    Standing Status: Future     Number of Occurrences:      Standing Expiration Date: 07/17/2014    Order Specific Question:   Reason for Exam (SYMPTOM  OR DIAGNOSIS REQUIRED)    Answer:  Hx lymphoma of tonsilt, swollen LN in right neck, exclude progression    Order Specific Question:  Preferred imaging location?    Answer:  [facility]  . CBC with Differential    Standing Status: Future     Number of Occurrences:      Standing Expiration Date: 01/06/2014  . Lactate dehydrogenase    Standing Status: Future     Number of Occurrences:      Standing Expiration Date: 04/16/2014  . Hepatic function panel    Standing Status: Future     Number of Occurrences:      Standing Expiration Date: 04/16/2014  . Basic metabolic panel    Standing Status: Future     Number of Occurrences:  Standing Expiration Date: 04/16/2014  . T4, free    Standing Status: Future     Number of Occurrences:      Standing Expiration Date: 04/16/2014  . TSH    Standing Status: Future     Number of Occurrences:      Standing Expiration Date: 04/16/2014   All questions were answered. The patient knows to call the clinic with any problems, questions or concerns. No barriers to learning was detected. I spent 25 minutes counseling the patient face to face. The total time spent in the appointment was 40 minutes and more than 50% was on counseling and review of test results     [facility], [place], MD 04/16/2013 10:55 AM

## 2013-04-16 NOTE — Telephone Encounter (Signed)
gv and printed appt sched an davs for pt for Feb thru May  °

## 2013-04-16 NOTE — Patient Instructions (Signed)
Implanted Port Instructions  An implanted port is a central line that has a round shape and is placed under the skin. It is used for long-term IV (intravenous) access for:  · Medicine.  · Fluids.  · Liquid nutrition, such as TPN (total parenteral nutrition).  · Blood samples.  Ports can be placed:  · In the chest area just below the collarbone (this is the most common place.)  · In the arms.  · In the belly (abdomen) area.  · In the legs.  PARTS OF THE PORT  A port has 2 main parts:  · The reservoir. The reservoir is round, disc-shaped, and will be a small, raised area under your skin.  · The reservoir is the part where a needle is inserted (accessed) to either give medicines or to draw blood.  · The catheter. The catheter is a long, slender tube that extends from the reservoir. The catheter is placed into a large vein.  · Medicine that is inserted into the reservoir goes into the catheter and then into the vein.  INSERTION OF THE PORT  · The port is surgically placed in either an operating room or in a procedural area (interventional radiology).  · Medicine may be given to help you relax during the procedure.  · The skin where the port will be inserted is numbed (local anesthetic).  · 1 or 2 small cuts (incisions) will be made in the skin to insert the port.  · The port can be used after it has been inserted.  INCISION SITE CARE  · The incision site may have small adhesive strips on it. This helps keep the incision site closed. Sometimes, no adhesive strips are placed. Instead of adhesive strips, a special kind of surgical glue is used to keep the incision closed.  · If adhesive strips were placed on the incision sites, do not take them off. They will fall off on their own.  · The incision site may be sore for 1 to 2 days. Pain medicine can help.  · Do not get the incision site wet. Bathe or shower as directed by your caregiver.  · The incision site should heal in 5 to 7 days. A small scar may form after the  incision has healed.  ACCESSING THE PORT  Special steps must be taken to access the port:  · Before the port is accessed, a numbing cream can be placed on the skin. This helps numb the skin over the port site.  · A sterile technique is used to access the port.  · The port is accessed with a needle. Only "non-coring" port needles should be used to access the port. Once the port is accessed, a blood return should be checked. This helps ensure the port is in the vein and is not clogged (clotted).  · If your caregiver believes your port should remain accessed, a clear (transparent) bandage will be placed over the needle site. The bandage and needle will need to be changed every week or as directed by your caregiver.  · Keep the bandage covering the needle clean and dry. Do not get it wet. Follow your caregiver's instructions on how to take a shower or bath when the port is accessed.  · If your port does not need to stay accessed, no bandage is needed over the port.  FLUSHING THE PORT  Flushing the port keeps it from getting clogged. How often the port is flushed depends on:  · If a   constant infusion is running. If a constant infusion is running, the port may not need to be flushed.  · If intermittent medicines are given.  · If the port is not being used.  For intermittent medicines:  · The port will need to be flushed:  · After medicines have been given.  · After blood has been drawn.  · As part of routine maintenance.  · A port is normally flushed with:  · Normal saline.  · Heparin.  · Follow your caregiver's advice on how often, how much, and the type of flush to use on your port.  IMPORTANT PORT INFORMATION  · Tell your caregiver if you are allergic to heparin.  · After your port is placed, you will get a manufacturer's information card. The card has information about your port. Keep this card with you at all times.  · There are many types of ports available. Know what kind of port you have.  · In case of an  emergency, it may be helpful to wear a medical alert bracelet. This can help alert health care workers that you have a port.  · The port can stay in for as long as your caregiver believes it is necessary.  · When it is time for the port to come out, surgery will be done to remove it. The surgery will be similar to how the port was put in.  · If you are in the hospital or clinic:  · Your port will be taken care of and flushed by a nurse.  · If you are at home:  · A home health care nurse may give medicines and take care of the port.  · You or a family member can get special training and directions for giving medicine and taking care of the port at home.  SEEK IMMEDIATE MEDICAL CARE IF:   · Your port does not flush or you are unable to get a blood return.  · New drainage or pus is coming from the incision.  · A bad smell is coming from the incision site.  · You develop swelling or increased redness at the incision site.  · You develop increased swelling or pain at the port site.  · You develop swelling or pain in the surrounding skin near the port.  · You have an oral temperature above 102° F (38.9° C), not controlled by medicine.  MAKE SURE YOU:   · Understand these instructions.  · Will watch your condition.  · Will get help right away if you are not doing well or get worse.  Document Released: 03/26/2005 Document Revised: 06/18/2011 Document Reviewed: 06/17/2008  Exit[employer]® Patient Information ©2014 [employer].

## 2013-05-13 ENCOUNTER — Ambulatory Visit (HOSPITAL_BASED_OUTPATIENT_CLINIC_OR_DEPARTMENT_OTHER): Payer: Medicare Other

## 2013-05-13 ENCOUNTER — Telehealth: Payer: Self-pay | Admitting: Hematology and Oncology

## 2013-05-13 VITALS — BP 124/83 | HR 109 | Temp 98.2°F

## 2013-05-13 DIAGNOSIS — Z95828 Presence of other vascular implants and grafts: Secondary | ICD-10-CM

## 2013-05-13 DIAGNOSIS — Z452 Encounter for adjustment and management of vascular access device: Secondary | ICD-10-CM

## 2013-05-13 DIAGNOSIS — C8581 Other specified types of non-Hodgkin lymphoma, lymph nodes of head, face, and neck: Secondary | ICD-10-CM

## 2013-05-13 MED ORDER — HEPARIN SOD (PORK) LOCK FLUSH 100 UNIT/ML IV SOLN
500.0000 [IU] | Freq: Once | INTRAVENOUS | Status: AC
  Administered 2013-05-13: 500 [IU] via INTRAVENOUS
  Filled 2013-05-13: qty 5

## 2013-05-13 MED ORDER — SODIUM CHLORIDE 0.9 % IJ SOLN
10.0000 mL | INTRAMUSCULAR | Status: DC | PRN
  Administered 2013-05-13: 10 mL via INTRAVENOUS
  Filled 2013-05-13: qty 10

## 2013-05-13 NOTE — Patient Instructions (Signed)

## 2013-05-13 NOTE — Telephone Encounter (Signed)
pt came in and needed to r/s flush to same day as ct...done

## 2013-06-10 ENCOUNTER — Ambulatory Visit (HOSPITAL_BASED_OUTPATIENT_CLINIC_OR_DEPARTMENT_OTHER): Payer: Medicare Other

## 2013-06-10 VITALS — BP 125/77 | HR 84 | Temp 97.9°F

## 2013-06-10 DIAGNOSIS — Z452 Encounter for adjustment and management of vascular access device: Secondary | ICD-10-CM

## 2013-06-10 DIAGNOSIS — C8581 Other specified types of non-Hodgkin lymphoma, lymph nodes of head, face, and neck: Secondary | ICD-10-CM

## 2013-06-10 DIAGNOSIS — Z95828 Presence of other vascular implants and grafts: Secondary | ICD-10-CM

## 2013-06-10 MED ORDER — SODIUM CHLORIDE 0.9 % IJ SOLN
10.0000 mL | INTRAMUSCULAR | Status: DC | PRN
  Administered 2013-06-10: 10 mL via INTRAVENOUS
  Filled 2013-06-10: qty 10

## 2013-06-10 MED ORDER — HEPARIN SOD (PORK) LOCK FLUSH 100 UNIT/ML IV SOLN
500.0000 [IU] | Freq: Once | INTRAVENOUS | Status: AC
  Administered 2013-06-10: 500 [IU] via INTRAVENOUS
  Filled 2013-06-10: qty 5

## 2013-07-08 ENCOUNTER — Ambulatory Visit (HOSPITAL_BASED_OUTPATIENT_CLINIC_OR_DEPARTMENT_OTHER): Payer: Medicare Other

## 2013-07-08 VITALS — BP 128/84 | HR 78 | Temp 97.6°F

## 2013-07-08 DIAGNOSIS — Z95828 Presence of other vascular implants and grafts: Secondary | ICD-10-CM

## 2013-07-08 DIAGNOSIS — Z452 Encounter for adjustment and management of vascular access device: Secondary | ICD-10-CM

## 2013-07-08 DIAGNOSIS — C8581 Other specified types of non-Hodgkin lymphoma, lymph nodes of head, face, and neck: Secondary | ICD-10-CM

## 2013-07-08 MED ORDER — SODIUM CHLORIDE 0.9 % IJ SOLN
10.0000 mL | INTRAMUSCULAR | Status: DC | PRN
  Administered 2013-07-08: 10 mL via INTRAVENOUS
  Filled 2013-07-08: qty 10

## 2013-07-08 MED ORDER — HEPARIN SOD (PORK) LOCK FLUSH 100 UNIT/ML IV SOLN
500.0000 [IU] | Freq: Once | INTRAVENOUS | Status: AC
  Administered 2013-07-08: 500 [IU] via INTRAVENOUS
  Filled 2013-07-08: qty 5

## 2013-07-08 NOTE — Patient Instructions (Signed)

## 2013-08-03 ENCOUNTER — Other Ambulatory Visit (HOSPITAL_BASED_OUTPATIENT_CLINIC_OR_DEPARTMENT_OTHER): Payer: Medicare Other

## 2013-08-03 ENCOUNTER — Ambulatory Visit (HOSPITAL_COMMUNITY)
Admission: RE | Admit: 2013-08-03 | Discharge: 2013-08-03 | Disposition: A | Discharge status: Home / Self Care | Payer: Medicare Other | Source: Ambulatory Visit | Attending: Hematology and Oncology | Admitting: Hematology and Oncology

## 2013-08-03 ENCOUNTER — Encounter (HOSPITAL_COMMUNITY): Payer: Self-pay

## 2013-08-03 ENCOUNTER — Ambulatory Visit (HOSPITAL_BASED_OUTPATIENT_CLINIC_OR_DEPARTMENT_OTHER): Payer: Medicare Other

## 2013-08-03 VITALS — BP 127/84 | HR 75 | Temp 97.8°F

## 2013-08-03 DIAGNOSIS — C8581 Other specified types of non-Hodgkin lymphoma, lymph nodes of head, face, and neck: Secondary | ICD-10-CM | POA: Insufficient documentation

## 2013-08-03 DIAGNOSIS — C8591 Non-Hodgkin lymphoma, unspecified, lymph nodes of head, face, and neck: Secondary | ICD-10-CM

## 2013-08-03 DIAGNOSIS — Z95828 Presence of other vascular implants and grafts: Secondary | ICD-10-CM

## 2013-08-03 DIAGNOSIS — R5383 Other fatigue: Secondary | ICD-10-CM

## 2013-08-03 DIAGNOSIS — Z9889 Other specified postprocedural states: Secondary | ICD-10-CM

## 2013-08-03 LAB — BASIC METABOLIC PANEL (CC13)
Anion Gap: 11 mEq/L (ref 3–11)
BUN: 13.1 mg/dL (ref 7.0–26.0)
CALCIUM: 9.5 mg/dL (ref 8.4–10.4)
CHLORIDE: 108 meq/L (ref 98–109)
CO2: 22 mEq/L (ref 22–29)
Creatinine: 0.8 mg/dL (ref 0.7–1.3)
Glucose: 93 mg/dl (ref 70–140)
Potassium: 4.1 mEq/L (ref 3.5–5.1)
Sodium: 141 mEq/L (ref 136–145)

## 2013-08-03 LAB — CBC WITH DIFFERENTIAL/PLATELET
BASO%: 1.1 % (ref 0.0–2.0)
Basophils Absolute: 0 10*3/uL (ref 0.0–0.1)
EOS%: 1.4 % (ref 0.0–7.0)
Eosinophils Absolute: 0 10*3/uL (ref 0.0–0.5)
HCT: 43.3 % (ref 38.4–49.9)
HGB: 15.1 g/dL (ref 13.0–17.1)
LYMPH%: 21.4 % (ref 14.0–49.0)
MCH: 32.9 pg (ref 27.2–33.4)
MCHC: 34.8 g/dL (ref 32.0–36.0)
MCV: 94.5 fL (ref 79.3–98.0)
MONO#: 0.3 10*3/uL (ref 0.1–0.9)
MONO%: 8.5 % (ref 0.0–14.0)
NEUT#: 2.3 10*3/uL (ref 1.5–6.5)
NEUT%: 67.6 % (ref 39.0–75.0)
Platelets: 195 10*3/uL (ref 140–400)
RBC: 4.58 10*6/uL (ref 4.20–5.82)
RDW: 12.5 % (ref 11.0–14.6)
WBC: 3.4 10*3/uL — ABNORMAL LOW (ref 4.0–10.3)
lymph#: 0.7 10*3/uL — ABNORMAL LOW (ref 0.9–3.3)

## 2013-08-03 LAB — HEPATIC FUNCTION PANEL
ALBUMIN: 4.3 g/dL (ref 3.5–5.2)
ALT: 13 U/L (ref 0–53)
AST: 26 U/L (ref 0–37)
Alkaline Phosphatase: 58 U/L (ref 39–117)
BILIRUBIN TOTAL: 1.1 mg/dL (ref 0.3–1.2)
Bilirubin, Direct: 0.2 mg/dL (ref 0.0–0.3)
Indirect Bilirubin: 0.9 mg/dL (ref 0.2–1.2)
Total Protein: 7.1 g/dL (ref 6.0–8.3)

## 2013-08-03 LAB — LACTATE DEHYDROGENASE (CC13): LDH: 149 U/L (ref 125–245)

## 2013-08-03 LAB — TSH CHCC: TSH: 2.117 m(IU)/L (ref 0.320–4.118)

## 2013-08-03 LAB — T4, FREE: Free T4: 1.07 ng/dL (ref 0.80–1.80)

## 2013-08-03 MED ORDER — HEPARIN SOD (PORK) LOCK FLUSH 100 UNIT/ML IV SOLN
500.0000 [IU] | Freq: Once | INTRAVENOUS | Status: AC
  Administered 2013-08-03: 500 [IU] via INTRAVENOUS
  Filled 2013-08-03: qty 5

## 2013-08-03 MED ORDER — IOHEXOL 300 MG/ML  SOLN
80.0000 mL | Freq: Once | INTRAMUSCULAR | Status: AC | PRN
  Administered 2013-08-03: 80 mL via INTRAVENOUS

## 2013-08-03 MED ORDER — SODIUM CHLORIDE 0.9 % IJ SOLN
10.0000 mL | INTRAMUSCULAR | Status: DC | PRN
  Administered 2013-08-03: 10 mL via INTRAVENOUS
  Filled 2013-08-03: qty 10

## 2013-08-03 NOTE — Patient Instructions (Signed)

## 2013-08-05 ENCOUNTER — Other Ambulatory Visit: Payer: Medicare Other

## 2013-08-07 ENCOUNTER — Ambulatory Visit (HOSPITAL_BASED_OUTPATIENT_CLINIC_OR_DEPARTMENT_OTHER): Payer: Medicare Other | Admitting: Hematology and Oncology

## 2013-08-07 ENCOUNTER — Telehealth: Payer: Self-pay | Admitting: Hematology and Oncology

## 2013-08-07 DIAGNOSIS — D72819 Decreased white blood cell count, unspecified: Secondary | ICD-10-CM

## 2013-08-07 DIAGNOSIS — C8599 Non-Hodgkin lymphoma, unspecified, extranodal and solid organ sites: Secondary | ICD-10-CM

## 2013-08-07 DIAGNOSIS — Z8551 Personal history of malignant neoplasm of bladder: Secondary | ICD-10-CM

## 2013-08-07 DIAGNOSIS — Z85038 Personal history of other malignant neoplasm of large intestine: Secondary | ICD-10-CM

## 2013-08-07 DIAGNOSIS — C8581 Other specified types of non-Hodgkin lymphoma, lymph nodes of head, face, and neck: Secondary | ICD-10-CM

## 2013-08-07 NOTE — Progress Notes (Signed)
[place] OFFICE PROGRESS NOTE  Patient Care Team: Chipper Herb, MD as PCP - General (Family Medicine) Melida Quitter, MD as Attending Physician (Otolaryngology) Burke Keels, MD (General Surgery) Miguel Dibble, MD as Attending Physician (Urology)  DIAGNOSIS: Stage I lymphoma, no evidence of disease  SUMMARY OF ONCOLOGIC HISTORY: #1 history of follicular lymphoma of the left tonsil, high grade, status post R. CHOP chemotherapy and radiation therapy #2 history of bladder cancer in 2010 and local recurrence in 2013, on observation #3 stage II colon cancer status post hemicolectomy in 2010 without adjuvant treatment, on observation   INTERVAL HISTORY: Kenneth Reed 68 y.o. male returns for further followup. He complains of persistent lump in the right side of the neck. Otherwise no new lymphadenopathy. He denies any recent fever, chills, night sweats or abnormal weight loss  I have reviewed the past medical history, past surgical history, social history and family history with the patient and they are unchanged from previous note.  ALLERGIES:  has No Known Allergies.  MEDICATIONS:  Current Outpatient Prescriptions  Medication Sig Dispense Refill  . albuterol (PROVENTIL HFA;VENTOLIN HFA) 108 (90 BASE) MCG/ACT inhaler Inhale 2 puffs into the lungs every 4 (four) hours as needed. For shortness of breath.  1 Inhaler  2  . finasteride (PROSCAR) 5 MG tablet Take 1 tablet (5 mg total) by mouth at bedtime.  30 tablet  1  . Fluticasone-Salmeterol (ADVAIR) 250-50 MCG/DOSE AEPB Inhale 1 puff into the lungs every 12 (twelve) hours.      . lidocaine-prilocaine (EMLA) cream Apply 1 application topically daily as needed. Applies to port-a-cath site one hour prior to needle stick.      Marland Kitchen ondansetron (ZOFRAN) 8 MG tablet Take 8 mg by mouth 2 (two) times daily. He is to take two times a day starting the day after chemo for 3 days. Then take two times a day as needed for nausea or  vomiting.      . prochlorperazine (COMPAZINE) 10 MG tablet Take 10 mg by mouth every 6 (six) hours as needed. For nausea.       No current facility-administered medications for this visit.    REVIEW OF SYSTEMS:   Constitutional: Denies fevers, chills or abnormal weight loss Eyes: Denies blurriness of vision Ears, nose, mouth, throat, and face: Denies mucositis or sore throat Respiratory: Denies cough, dyspnea or wheezes Cardiovascular: Denies palpitation, chest discomfort or lower extremity swelling Gastrointestinal:  Denies nausea, heartburn or change in bowel habits Skin: Denies abnormal skin rashes Lymphatics: Denies new lymphadenopathy or easy bruising Neurological:Denies numbness, tingling or new weaknesses Behavioral/Psych: Mood is stable, no new changes  All other systems were reviewed with the patient and are negative.  PHYSICAL EXAMINATION: ECOG PERFORMANCE STATUS: 0 - Asymptomatic HR: 76 BP: 140/90 RR 14 GENERAL:alert, no distress and comfortable SKIN: skin color, texture, turgor are normal, no rashes or significant lesions EYES: normal, Conjunctiva are pink and non-injected, sclera clear OROPHARYNX:no exudate, no erythema and lips, buccal mucosa, and tongue normal  NECK: supple, thyroid normal size, non-tender, without nodularity LYMPH:  There is a small nodule in the right submandibular region, none elsewhere.  LUNGS: clear to auscultation and percussion with normal breathing effort HEART: regular rate & rhythm and no murmurs and no lower extremity edema ABDOMEN:abdomen soft, non-tender and normal bowel sounds Musculoskeletal:no cyanosis of digits and no clubbing  NEURO: alert & oriented x 3 with fluent speech, no focal motor/sensory deficits  LABORATORY DATA:  I have reviewed the  data as listed    Component Value Date/Time   NA 141 08/03/2013 0925   K 4.1 08/03/2013 0925   CL 107 07/11/2012 0817   CO2 22 08/03/2013 0925   GLUCOSE 93 08/03/2013 0925   GLUCOSE 96  07/11/2012 0817   BUN 13.1 08/03/2013 0925   CREATININE 0.8 08/03/2013 0925   CALCIUM 9.5 08/03/2013 0925   PROT 7.1 08/03/2013 0925   PROT 6.9 04/16/2013 0856   ALBUMIN 4.3 08/03/2013 0925   ALBUMIN 4.0 04/16/2013 0856   AST 26 08/03/2013 0925   AST 19 04/16/2013 0856   ALT 13 08/03/2013 0925   ALT 12 04/16/2013 0856   ALKPHOS 58 08/03/2013 0925   ALKPHOS 54 04/16/2013 0856   BILITOT 1.1 08/03/2013 0925   BILITOT 1.32* 04/16/2013 0856    No results found for this basename: SPEP,  UPEP,   kappa and lambda light chains    Lab Results  Component Value Date   WBC 3.4* 08/03/2013   NEUTROABS 2.3 08/03/2013   HGB 15.1 08/03/2013   HCT 43.3 08/03/2013   MCV 94.5 08/03/2013   PLT 195 08/03/2013      Chemistry      Component Value Date/Time   NA 141 08/03/2013 0925   K 4.1 08/03/2013 0925   CL 107 07/11/2012 0817   CO2 22 08/03/2013 0925   BUN 13.1 08/03/2013 0925   CREATININE 0.8 08/03/2013 0925      Component Value Date/Time   CALCIUM 9.5 08/03/2013 0925   ALKPHOS 58 08/03/2013 0925   ALKPHOS 54 04/16/2013 0856   AST 26 08/03/2013 0925   AST 19 04/16/2013 0856   ALT 13 08/03/2013 0925   ALT 12 04/16/2013 0856   BILITOT 1.1 08/03/2013 0925   BILITOT 1.32* 04/16/2013 0856    RADIOGRAPHIC STUDIES: I reviewed the CT scan with him and his wife I have personally reviewed the radiological images as listed and agreed with the findings in the report.  ASSESSMENT & PLAN:  #1 history of follicular lymphoma, no evidence of disease I reassured the patient that the palpable lump in the right the neck is his submandibular gland. CT scan show no evidence of disease. The patient has early stage I disease and is cured. I recommend removal of port. I would discontinue routine imaging study and will only order them if there are signs and symptoms to suspect disease recurrence. Due to exposure to radiation treatment, I will continue to ordering thyroid function test once a year. I will lengthen his visits to every 6 months. #2  history of colon cancer His CEA was negative. His last PET CT scan did not show any evidence of disease recurrence. I recommend he continues routine surveillance colonoscopy and followup with his gastroenterologist. #3 history of bladder cancer He will continue followup with urologist #4 leukopenia This is likely due to recent treatment. The patient denies recent history of fevers, cough, chills, diarrhea or dysuria. He is asymptomatic from the leukopenia. I will observe for now.    Orders Placed This Encounter  Procedures  . IR Removal Tun Access W/ Port W/O FL    Standing Status: Future     Number of Occurrences:      Standing Expiration Date: 10/08/2014    Order Specific Question:  Reason for exam:    Answer:  port removal    Order Specific Question:  Preferred Imaging Location?    Answer:  [facility]  . CBC with Differential  Standing Status: Future     Number of Occurrences:      Standing Expiration Date: 08/07/2014  . Comprehensive metabolic panel    Standing Status: Future     Number of Occurrences:      Standing Expiration Date: 08/07/2014  . Lactate dehydrogenase    Standing Status: Future     Number of Occurrences:      Standing Expiration Date: 08/07/2014   All questions were answered. The patient knows to call the clinic with any problems, questions or concerns. No barriers to learning was detected. I spent 25 minutes counseling the patient face to face. The total time spent in the appointment was 30 minutes and more than 50% was on counseling and review of test results     Heath Lark, MD 08/07/2013 9:34 AM

## 2013-08-07 NOTE — Telephone Encounter (Signed)
gve the pt his avs schedule for nov. Pt is aware that he will be called with the port removal appt from [facility].

## 2013-08-13 ENCOUNTER — Other Ambulatory Visit: Payer: Self-pay | Admitting: Radiology

## 2013-08-13 ENCOUNTER — Encounter (HOSPITAL_COMMUNITY): Payer: Self-pay | Admitting: Pharmacy Technician

## 2013-08-16 ENCOUNTER — Other Ambulatory Visit: Payer: Self-pay | Admitting: Radiology

## 2013-08-17 ENCOUNTER — Ambulatory Visit (HOSPITAL_COMMUNITY)
Admission: RE | Admit: 2013-08-17 | Discharge: 2013-08-17 | Disposition: A | Discharge status: Home / Self Care | Payer: Medicare Other | Source: Ambulatory Visit | Attending: Hematology and Oncology | Admitting: Hematology and Oncology

## 2013-08-17 ENCOUNTER — Encounter (HOSPITAL_COMMUNITY): Payer: Self-pay

## 2013-08-17 DIAGNOSIS — Z79899 Other long term (current) drug therapy: Secondary | ICD-10-CM | POA: Insufficient documentation

## 2013-08-17 DIAGNOSIS — Z8551 Personal history of malignant neoplasm of bladder: Secondary | ICD-10-CM | POA: Insufficient documentation

## 2013-08-17 DIAGNOSIS — Z9049 Acquired absence of other specified parts of digestive tract: Secondary | ICD-10-CM | POA: Insufficient documentation

## 2013-08-17 DIAGNOSIS — C8599 Non-Hodgkin lymphoma, unspecified, extranodal and solid organ sites: Secondary | ICD-10-CM

## 2013-08-17 DIAGNOSIS — Z85038 Personal history of other malignant neoplasm of large intestine: Secondary | ICD-10-CM | POA: Insufficient documentation

## 2013-08-17 DIAGNOSIS — Z452 Encounter for adjustment and management of vascular access device: Secondary | ICD-10-CM | POA: Insufficient documentation

## 2013-08-17 DIAGNOSIS — C8589 Other specified types of non-Hodgkin lymphoma, extranodal and solid organ sites: Secondary | ICD-10-CM | POA: Insufficient documentation

## 2013-08-17 LAB — PROTIME-INR
INR: 1.01 (ref 0.00–1.49)
Prothrombin Time: 13.1 seconds (ref 11.6–15.2)

## 2013-08-17 LAB — CBC WITH DIFFERENTIAL/PLATELET
BASOS PCT: 1 % (ref 0–1)
Basophils Absolute: 0 10*3/uL (ref 0.0–0.1)
EOS PCT: 2 % (ref 0–5)
Eosinophils Absolute: 0.1 10*3/uL (ref 0.0–0.7)
HCT: 41.7 % (ref 39.0–52.0)
Hemoglobin: 15.1 g/dL (ref 13.0–17.0)
Lymphocytes Relative: 22 % (ref 12–46)
Lymphs Abs: 0.8 10*3/uL (ref 0.7–4.0)
MCH: 33 pg (ref 26.0–34.0)
MCHC: 36.2 g/dL — AB (ref 30.0–36.0)
MCV: 91.2 fL (ref 78.0–100.0)
MONO ABS: 0.3 10*3/uL (ref 0.1–1.0)
Monocytes Relative: 7 % (ref 3–12)
Neutro Abs: 2.5 10*3/uL (ref 1.7–7.7)
Neutrophils Relative %: 68 % (ref 43–77)
Platelets: 179 10*3/uL (ref 150–400)
RBC: 4.57 MIL/uL (ref 4.22–5.81)
RDW: 12.5 % (ref 11.5–15.5)
WBC: 3.7 10*3/uL — ABNORMAL LOW (ref 4.0–10.5)

## 2013-08-17 MED ORDER — CEFAZOLIN SODIUM-DEXTROSE 2-3 GM-% IV SOLR
2.0000 g | Freq: Once | INTRAVENOUS | Status: AC
  Administered 2013-08-17: 2 g via INTRAVENOUS
  Filled 2013-08-17: qty 50

## 2013-08-17 MED ORDER — DIPHENHYDRAMINE HCL 25 MG PO CAPS
50.0000 mg | ORAL_CAPSULE | Freq: Once | ORAL | Status: AC
  Administered 2013-08-17: 50 mg via ORAL
  Filled 2013-08-17 (×2): qty 2

## 2013-08-17 MED ORDER — FENTANYL CITRATE 0.05 MG/ML IJ SOLN
INTRAMUSCULAR | Status: AC
  Filled 2013-08-17: qty 2

## 2013-08-17 MED ORDER — LIDOCAINE HCL 1 % IJ SOLN
INTRAMUSCULAR | Status: AC
  Filled 2013-08-17: qty 20

## 2013-08-17 MED ORDER — SODIUM CHLORIDE 0.9 % IV SOLN
INTRAVENOUS | Status: DC
Start: 2013-08-17 — End: 2013-08-18
  Administered 2013-08-17: 07:00:00 via INTRAVENOUS

## 2013-08-17 MED ORDER — MIDAZOLAM HCL 2 MG/2ML IJ SOLN
INTRAMUSCULAR | Status: AC
  Filled 2013-08-17: qty 2

## 2013-08-17 NOTE — Discharge Instructions (Signed)
Incision Care ° An incision is a surgical cut to open your skin. You need to take care of your incision. This helps you to not get an infection. °HOME CARE °· Only take medicine as told by your doctor. °· Do not take off your bandage (dressing) or get your incision wet until your doctor approves. Change the bandage and call your doctor if the bandage gets wet, dirty, or starts to smell. °· Take showers. Do not take baths, swim, or do anything that may soak your incision until it heals. °· Return to your normal diet and activities as told or allowed by your doctor. °· Avoid lifting any weight until your doctor approves. °· Put medicine that helps lessen itching on your incision as told by your doctor. Do not pick or scratch at your incision. °· Keep your doctor visit to have your stitches (sutures) or staples removed. °· Drink enough fluids to keep your pee (urine) clear or pale yellow. °GET HELP RIGHT AWAY IF: °· You have a fever. °· You have a rash. °· You are dizzy, or you pass out (faint) while standing. °· You have trouble breathing. °· You have a reaction or side effects to medicine given to you. °· You have redness, puffiness (swelling), or more pain in the incision and medicine does not help. °· You have fluid, blood, or yellowish-white fluid (pus) coming from the incision lasting over 1 day. °· You have muscle aches, chills, or you feel sick. °· You have a bad smell coming from the incision or bandage. °· Your incision opens up after stitches, staples, or sticky strips have been removed. °· You keep feeling sick to your stomach (nauseous) or keep throwing up (vomiting). °MAKE SURE YOU:  °· Understand these instructions. °· Will watch your condition. °· Will get help right away if you are not doing well or get worse. °Document Released: 06/18/2011 Document Reviewed: 06/18/2011 °Exit[employer]® Patient Information ©2014 [employer]. ° °

## 2013-08-17 NOTE — Procedures (Signed)
No note

## 2013-08-17 NOTE — Progress Notes (Signed)
Pt has small raised area noted on L side of forehead, mild itching on chest, no other s/s of rash/hives/swelling. Dr Barbie Banner at pts bedside for eval. Orders received for benadryl.

## 2013-08-17 NOTE — H&P (Signed)
Chief Complaint: "I'm here for my port removal" Referring Physician:Gorsuch HPI: Kenneth Reed is an 68 y.o. male with hx of lymphoma who has completed treatment. He is scheduled today for port removal. PMHx and meds reviewed. Pt feels ok, no recent fevers, chills, illness  Past Medical History:  Past Medical History  Diagnosis Date  . Non-Hodgkin's lymphoma of head   . Fatigue   . Colon cancer 2010    Dr. Anthony Sar operated; stage II.  No need for chemo.  . Bladder cancer 2010    Dr. [place] Pink operated; no need for chemo; recurred locally  in 2013 locally.    Marland Kitchen BPH (benign prostatic hyperplasia)   . Cancer 03/26/12    High Grade NonHodgkin's B- Cell Lymphoma  . Status post chemotherapy 04/18/2012 - 06/20/12     4 Cycles of R-CHOP with 3 Doses of Prophylactic Intrathecal chemo.  . Weight loss 07/11/12 note  . History of colon cancer 01/07/2013    Past Surgical History:  Past Surgical History  Procedure Laterality Date  . Nasal polyp surgery  1983  . Colectomy  2010  . Bladder surgery  2010  . Colonoscopy  2012    next one 2014.   . Biospy left tonsil  03/26/12    Non - Hodgkins B-Cell Lympyhoma    Family History:  Family History  Problem Relation Age of Onset  . Cancer Father     leukemia  . Cancer Maternal Aunt     leukemia  . Cancer Paternal Grandmother     leukemia    Social History:  reports that he has never smoked. He has quit using smokeless tobacco. He reports that he does not drink alcohol or use illicit drugs.  Allergies: No Known Allergies  Medications:   Medication List    ASK your doctor about these medications       albuterol 108 (90 BASE) MCG/ACT inhaler  Commonly known as:  PROVENTIL HFA;VENTOLIN HFA  Inhale 2 puffs into the lungs every 4 (four) hours as needed. For shortness of breath.     cholecalciferol 1000 UNITS tablet  Commonly known as:  VITAMIN D  Take 1,000 Units by mouth daily.     Fluticasone-Salmeterol 250-50 MCG/DOSE Aepb   Commonly known as:  ADVAIR  Inhale 2 puffs into the lungs every morning.     lidocaine-prilocaine cream  Commonly known as:  EMLA  Apply 1 application topically daily as needed. Applies to port-a-cath site one hour prior to needle stick.     multivitamin with minerals Tabs tablet  Take 1 tablet by mouth daily.        Please HPI for pertinent positives, otherwise complete 10 system ROS negative.  Physical Exam: BP 126/80  Pulse 75  Temp(Src) 97.3 F (36.3 C) (Oral)  Resp 18  SpO2 100% There is no weight on file to calculate BMI.   General Appearance:  Alert, cooperative, no distress, appears stated age  Head:  Normocephalic, without obvious abnormality, atraumatic  ENT: Unremarkable  Neck: Supple, symmetrical, trachea midline  Lungs:   Clear to auscultation bilaterally, no w/r/r, respirations unlabored without use of accessory muscles.  Chest Wall:  No tenderness or deformity. (R)port hub and tubing palpable, NT  Heart:  Regular rate and rhythm, S1, S2 normal, no murmur, rub or gallop.  Neurologic: Normal affect, no gross deficits.   Results for orders placed during the hospital encounter of 08/17/13 (from the past 48 hour(s))  CBC WITH DIFFERENTIAL  Status: Abnormal   Collection Time    08/17/13  7:20 AM      Result Value Ref Range   WBC 3.7 (*) 4.0 - 10.5 K/uL   RBC 4.57  4.22 - 5.81 MIL/uL   Hemoglobin 15.1  13.0 - 17.0 g/dL   HCT 41.7  39.0 - 52.0 %   MCV 91.2  78.0 - 100.0 fL   MCH 33.0  26.0 - 34.0 pg   MCHC 36.2 (*) 30.0 - 36.0 g/dL   RDW 12.5  11.5 - 15.5 %   Platelets 179  150 - 400 K/uL   Neutrophils Relative % 68  43 - 77 %   Neutro Abs 2.5  1.7 - 7.7 K/uL   Lymphocytes Relative 22  12 - 46 %   Lymphs Abs 0.8  0.7 - 4.0 K/uL   Monocytes Relative 7  3 - 12 %   Monocytes Absolute 0.3  0.1 - 1.0 K/uL   Eosinophils Relative 2  0 - 5 %   Eosinophils Absolute 0.1  0.0 - 0.7 K/uL   Basophils Relative 1  0 - 1 %   Basophils Absolute 0.0  0.0 - 0.1 K/uL   PROTIME-INR     Status: None   Collection Time    08/17/13  7:20 AM      Result Value Ref Range   Prothrombin Time 13.1  11.6 - 15.2 seconds   INR 1.01  0.00 - 1.49   No results found.  Assessment/Plan Lymphoma, treatment complete For Port removal. Discussed procedure, risks, complications. Pt agreeable to proceed without sedation Labs reviewed. Consent signed in chart  Ascencion Dike PA-C 08/17/2013, 8:40 AM

## 2013-09-17 ENCOUNTER — Telehealth: Payer: Self-pay | Admitting: *Deleted

## 2013-09-17 ENCOUNTER — Ambulatory Visit (INDEPENDENT_AMBULATORY_CARE_PROVIDER_SITE_OTHER): Payer: Medicare Other | Admitting: General Practice

## 2013-09-17 ENCOUNTER — Encounter: Payer: Self-pay | Admitting: General Practice

## 2013-09-17 ENCOUNTER — Encounter (INDEPENDENT_AMBULATORY_CARE_PROVIDER_SITE_OTHER): Payer: Self-pay

## 2013-09-17 VITALS — BP 121/72 | HR 104 | Temp 99.2°F | Ht 69.75 in | Wt 140.6 lb

## 2013-09-17 DIAGNOSIS — T148 Other injury of unspecified body region: Secondary | ICD-10-CM

## 2013-09-17 DIAGNOSIS — W57XXXA Bitten or stung by nonvenomous insect and other nonvenomous arthropods, initial encounter: Secondary | ICD-10-CM

## 2013-09-17 DIAGNOSIS — R509 Fever, unspecified: Secondary | ICD-10-CM

## 2013-09-17 DIAGNOSIS — R52 Pain, unspecified: Secondary | ICD-10-CM

## 2013-09-17 LAB — POCT CBC
Granulocyte percent: 86.5 %G — AB (ref 37–80)
HCT, POC: 43.2 % — AB (ref 43.5–53.7)
Hemoglobin: 14.5 g/dL (ref 14.1–18.1)
Lymph, poc: 0.4 — AB (ref 0.6–3.4)
MCH: 32.1 pg — AB (ref 27–31.2)
MCHC: 33.6 g/dL (ref 31.8–35.4)
MCV: 95.6 fL (ref 80–97)
MPV: 5.8 fL (ref 0–99.8)
POC Granulocyte: 33.1 — AB (ref 2–6.9)
POC LYMPH PERCENT: 10.3 %L (ref 10–50)
Platelet Count, POC: 158 10*3/uL (ref 142–424)
RBC: 4.5 M/uL — AB (ref 4.69–6.13)
RDW, POC: 12 %
WBC: 3.6 10*3/uL — AB (ref 4.6–10.2)

## 2013-09-17 LAB — POCT INFLUENZA A/B
Influenza A, POC: NEGATIVE
Influenza B, POC: NEGATIVE

## 2013-09-17 NOTE — Telephone Encounter (Signed)
Pt wife reports pt has been feeling poorly for the past few days and had a "big sweat" last night, drenched his sheets.  She is worried pt is having a recurrence.  Also received VM from May, NP at [facility] states pt has been c/o body aches and fevers.  She feels pt needs to f/u w/ Dr. Alvy Bimler soon.

## 2013-09-17 NOTE — Progress Notes (Signed)
Subjective:    Patient ID: Kenneth Reed, male    DOB: 06/04/1945, 68 y.o.   MRN: [id number]  Headache  This is a new problem. The current episode started yesterday. The problem occurs constantly. The problem has been resolved. The pain is located in the frontal region. The pain does not radiate. The pain quality is not similar to prior headaches. The quality of the pain is described as aching. Associated symptoms include a fever, muscle aches and nausea. Pertinent negatives include no abdominal pain, blurred vision, coughing, dizziness, ear pain, eye redness, eye watering, neck pain, numbness, phonophobia, photophobia, rhinorrhea, scalp tenderness, sinus pressure, sore throat, swollen glands, tingling, tinnitus, vomiting or weakness. Nothing aggravates the symptoms. He has tried nothing for the symptoms. His past medical history is significant for cancer. There is no history of cluster headaches, migraine headaches, recent head traumas, sinus disease or TMJ.  Fever  This is a new problem. The current episode started yesterday. The problem has been resolved. His temperature was unmeasured prior to arrival. Associated symptoms include headaches, muscle aches and nausea. Pertinent negatives include no abdominal pain, chest pain, congestion, coughing, ear pain, rash, sore throat, urinary pain, vomiting or wheezing. He has tried nothing for the symptoms.  Patient has history of non hodgkin's lymphoma of tonsils. Reports having follow up appointment in 6 months with oncologist.    Review of Systems  Constitutional: Positive for fever. Negative for chills.  HENT: Negative for congestion, ear pain, facial swelling, postnasal drip, rhinorrhea, sinus pressure, sore throat and tinnitus.   Eyes: Negative for blurred vision, photophobia and redness.  Respiratory: Negative for cough, chest tightness and wheezing.   Cardiovascular: Negative for chest pain and palpitations.  Gastrointestinal: Positive for  nausea. Negative for vomiting and abdominal pain.  Musculoskeletal: Positive for myalgias. Negative for neck pain and neck stiffness.  Skin: Negative for rash.  Neurological: Positive for headaches. Negative for dizziness, tingling, weakness and numbness.  Psychiatric/Behavioral: Negative for suicidal ideas and self-injury.       Objective:   Physical Exam  Constitutional: He is oriented to person, place, and time. He appears well-developed and well-nourished.  HENT:  Head: Normocephalic and atraumatic.  Right Ear: External ear normal.  Left Ear: External ear normal.  Nose: Nose normal.  Mouth/Throat: Oropharynx is clear and moist.  Neck: Normal range of motion. Neck supple. No thyromegaly present.  Cardiovascular: Normal rate, regular rhythm and normal heart sounds.   Pulmonary/Chest: Effort normal and breath sounds normal. No respiratory distress. He exhibits no tenderness.  Lymphadenopathy:    He has no cervical adenopathy.  Neurological: He is alert and oriented to person, place, and time.  Skin: Skin is warm and dry.  Psychiatric: He has a normal mood and affect.     Results for orders placed in visit on 09/17/13  POCT INFLUENZA A/B      Result Value Ref Range   Influenza A, POC Negative     Influenza B, POC Negative    POCT CBC      Result Value Ref Range   WBC 3.6 (*) 4.6 - 10.2 K/uL   Lymph, poc 0.4 (*) 0.6 - 3.4   POC LYMPH PERCENT 10.3  10 - 50 %L   POC Granulocyte 33.1 (*) 2 - 6.9   Granulocyte percent 86.5 (*) 37 - 80 %G   RBC 4.5 (*) 4.69 - 6.13 M/uL   Hemoglobin 14.5  14.1 - 18.1 g/dL   HCT, POC 43.2 (*) 43.5 -  53.7 %   MCV 95.6  80 - 97 fL   MCH, POC 32.1 (*) 27 - 31.2 pg   MCHC 33.6  31.8 - 35.4 g/dL   RDW, POC 12.0     Platelet Count, POC 158.0  142 - 424 K/uL   MPV 5.8  0 - 99.8 fL        Assessment & Plan:   1. Body aches, 2. Tick bite, 3. Fever  - Rocky mtn spotted fvr abs pnl(IgG+IgM) - Lyme Ab/Western Blot Reflex -lab results pending  (discussed doxycycline, may be prescribed pending results) -Patient to call oncologist office to discuss follow up. Discuss if visit needed sooner than scheduled -Office awaiting call from oncologist office also -Patient verbalized understanding and in agreement

## 2013-09-17 NOTE — Telephone Encounter (Signed)
Left VM for Amber to return nurse's call.

## 2013-09-17 NOTE — Telephone Encounter (Signed)
I can see him at 1045 am on 6/24, pls place POF if oK with pt

## 2013-09-18 NOTE — Telephone Encounter (Signed)
Informed dau of appt to see Dr. Alvy Bimler on 6/24 and she asks if any way Dr. Alvy Bimler can see him sooner?  She is very concerned about his condition.  Says he is feelilng very achy and "flu like."  States PCP did r/o flu, but she says he is not feeling very good at all and not sure how to help him.  Encouraged her to push fluids, good nutrition and may alternate tylenol and ibuprofen as needed for aches. Will let her know if Dr. Alvy Bimler can see pt sooner. She verbalized understanding.

## 2013-09-19 ENCOUNTER — Emergency Department (HOSPITAL_COMMUNITY)
Admission: EM | Admit: 2013-09-19 | Discharge: 2013-09-19 | Disposition: A | Discharge status: Home / Self Care | Payer: Medicare Other | Attending: Emergency Medicine | Admitting: Emergency Medicine

## 2013-09-19 ENCOUNTER — Emergency Department (HOSPITAL_COMMUNITY): Payer: Medicare Other

## 2013-09-19 ENCOUNTER — Encounter (HOSPITAL_COMMUNITY): Payer: Self-pay | Admitting: Emergency Medicine

## 2013-09-19 DIAGNOSIS — R61 Generalized hyperhidrosis: Secondary | ICD-10-CM | POA: Insufficient documentation

## 2013-09-19 DIAGNOSIS — R531 Weakness: Secondary | ICD-10-CM

## 2013-09-19 DIAGNOSIS — R110 Nausea: Secondary | ICD-10-CM | POA: Insufficient documentation

## 2013-09-19 DIAGNOSIS — Z923 Personal history of irradiation: Secondary | ICD-10-CM | POA: Insufficient documentation

## 2013-09-19 DIAGNOSIS — Z85038 Personal history of other malignant neoplasm of large intestine: Secondary | ICD-10-CM | POA: Insufficient documentation

## 2013-09-19 DIAGNOSIS — Z9221 Personal history of antineoplastic chemotherapy: Secondary | ICD-10-CM | POA: Insufficient documentation

## 2013-09-19 DIAGNOSIS — Z79899 Other long term (current) drug therapy: Secondary | ICD-10-CM | POA: Insufficient documentation

## 2013-09-19 DIAGNOSIS — R5383 Other fatigue: Principal | ICD-10-CM

## 2013-09-19 DIAGNOSIS — R630 Anorexia: Secondary | ICD-10-CM | POA: Insufficient documentation

## 2013-09-19 DIAGNOSIS — R519 Headache, unspecified: Secondary | ICD-10-CM

## 2013-09-19 DIAGNOSIS — Z8571 Personal history of Hodgkin lymphoma: Secondary | ICD-10-CM | POA: Insufficient documentation

## 2013-09-19 DIAGNOSIS — Z87448 Personal history of other diseases of urinary system: Secondary | ICD-10-CM | POA: Insufficient documentation

## 2013-09-19 DIAGNOSIS — Z8551 Personal history of malignant neoplasm of bladder: Secondary | ICD-10-CM | POA: Insufficient documentation

## 2013-09-19 DIAGNOSIS — R6883 Chills (without fever): Secondary | ICD-10-CM | POA: Insufficient documentation

## 2013-09-19 DIAGNOSIS — R51 Headache: Secondary | ICD-10-CM | POA: Insufficient documentation

## 2013-09-19 DIAGNOSIS — R5381 Other malaise: Secondary | ICD-10-CM | POA: Insufficient documentation

## 2013-09-19 LAB — CBC WITH DIFFERENTIAL/PLATELET
BASOS PCT: 0 % (ref 0–1)
Basophils Absolute: 0 10*3/uL (ref 0.0–0.1)
EOS ABS: 0 10*3/uL (ref 0.0–0.7)
EOS PCT: 0 % (ref 0–5)
HCT: 40 % (ref 39.0–52.0)
HEMOGLOBIN: 14.4 g/dL (ref 13.0–17.0)
Lymphocytes Relative: 10 % — ABNORMAL LOW (ref 12–46)
Lymphs Abs: 0.6 10*3/uL — ABNORMAL LOW (ref 0.7–4.0)
MCH: 32.2 pg (ref 26.0–34.0)
MCHC: 36 g/dL (ref 30.0–36.0)
MCV: 89.5 fL (ref 78.0–100.0)
MONOS PCT: 12 % (ref 3–12)
Monocytes Absolute: 0.7 10*3/uL (ref 0.1–1.0)
NEUTROS PCT: 78 % — AB (ref 43–77)
Neutro Abs: 4.5 10*3/uL (ref 1.7–7.7)
Platelets: 156 10*3/uL (ref 150–400)
RBC: 4.47 MIL/uL (ref 4.22–5.81)
RDW: 12.3 % (ref 11.5–15.5)
WBC: 5.8 10*3/uL (ref 4.0–10.5)

## 2013-09-19 LAB — COMPREHENSIVE METABOLIC PANEL
ALBUMIN: 3.9 g/dL (ref 3.5–5.2)
ALK PHOS: 71 U/L (ref 39–117)
ALT: 13 U/L (ref 0–53)
AST: 21 U/L (ref 0–37)
BUN: 8 mg/dL (ref 6–23)
CALCIUM: 9.3 mg/dL (ref 8.4–10.5)
CO2: 24 mEq/L (ref 19–32)
CREATININE: 0.78 mg/dL (ref 0.50–1.35)
Chloride: 98 mEq/L (ref 96–112)
GFR calc non Af Amer: 89 mL/min — ABNORMAL LOW (ref >90)
Glucose, Bld: 102 mg/dL — ABNORMAL HIGH (ref 70–99)
POTASSIUM: 4.4 meq/L (ref 3.7–5.3)
Sodium: 137 mEq/L (ref 137–147)
TOTAL PROTEIN: 7.5 g/dL (ref 6.0–8.3)
Total Bilirubin: 1.1 mg/dL (ref 0.3–1.2)

## 2013-09-19 LAB — URINALYSIS, ROUTINE W REFLEX MICROSCOPIC
BILIRUBIN URINE: NEGATIVE
Glucose, UA: NEGATIVE mg/dL
Hgb urine dipstick: NEGATIVE
KETONES UR: 40 mg/dL — AB
Leukocytes, UA: NEGATIVE
Nitrite: NEGATIVE
PROTEIN: NEGATIVE mg/dL
Specific Gravity, Urine: 1.012 (ref 1.005–1.030)
UROBILINOGEN UA: 1 mg/dL (ref 0.0–1.0)
pH: 6 (ref 5.0–8.0)

## 2013-09-19 LAB — I-STAT CG4 LACTIC ACID, ED: Lactic Acid, Venous: 0.7 mmol/L (ref 0.5–2.2)

## 2013-09-19 LAB — I-STAT TROPONIN, ED: Troponin i, poc: 0.01 ng/mL (ref 0.00–0.08)

## 2013-09-19 MED ORDER — SODIUM CHLORIDE 0.9 % IV BOLUS (SEPSIS)
1000.0000 mL | Freq: Once | INTRAVENOUS | Status: AC
  Administered 2013-09-19: 1000 mL via INTRAVENOUS

## 2013-09-19 MED ORDER — ACETAMINOPHEN 325 MG PO TABS: 650.0000 mg | ORAL_TABLET | Freq: Once | ORAL | Status: DC

## 2013-09-19 MED ORDER — ONDANSETRON 8 MG PO TBDP: 8.0000 mg | ORAL_TABLET | Freq: Three times a day (TID) | ORAL | Status: DC | PRN

## 2013-09-19 MED ORDER — ONDANSETRON HCL 4 MG/2ML IJ SOLN: 4.0000 mg | Freq: Once | INTRAMUSCULAR | Status: DC

## 2013-09-19 NOTE — Telephone Encounter (Signed)
You can put him on 6/16 at 12 pm. The patient in that slot is in the hospital

## 2013-09-19 NOTE — Discharge Instructions (Signed)
Drink plenty of fluids. Zofran for nausea as prescribed. Follow up with primary care doctor and oncologist early this week. You have an apt with Dr. Alvy Bimler on 09/22/13 at 12:00

## 2013-09-19 NOTE — ED Notes (Addendum)
Pt states that "i feel bad". Pt states that he is weak and dont feel like doing anything and nauseated and started having night sweats WED night. Pt denies cough and states maybe low grade fever if any.  Pt has PMH cancer and feels like it might could be coming back due to the night sweats and that is bothering him. Tried calling doctor yesterday but was out of office and cant be seen until June 23.

## 2013-09-19 NOTE — ED Provider Notes (Signed)
CSN: [id number]     Arrival date & time 09/19/13  1219 History   First MD Initiated Contact with Patient 09/19/13 1536     Chief Complaint  Patient presents with  . Night Sweats  . Fatigue     (Consider location/radiation/quality/duration/timing/severity/associated sxs/prior Treatment) HPI Kenneth Reed is a 68 y.o. male who presents emergency department complaining of weakness, headache, and night sweats. Patient states his symptoms began 5 days ago. Was seen by his primary care doctor 2 days ago, had lab work done which they have not heard back from. Patient states that he has no appetite, not eating or drinking fluids. He denies any nausea or vomiting. He states that he does feel constipated and taking lactulose for this. He denies any fevers but feels like he has had chills. He denies any chest pain, shortness of breath, cough, abdominal pain, back pain. Patient with history of multiple cancers including 2 instances of bladder cancer, colon cancer with resection, non-Hodgkin's lymphoma. Patient states the most recent was non-Hodgkin's lymphoma and he finished radiation and chemotherapy approximately a year ago. He states he had a colonoscopy a week ago and it was normal. He denies any urinary symptoms. He denies any specific symptoms. No focal neurological symptoms  Past Medical History  Diagnosis Date  . Non-Hodgkin's lymphoma of head   . Fatigue   . Colon cancer 2010    Dr. Anthony Sar operated; stage II.  No need for chemo.  . Bladder cancer 2010    Dr. [place] Pink operated; no need for chemo; recurred locally  in 2013 locally.    Marland Kitchen BPH (benign prostatic hyperplasia)   . Cancer 03/26/12    High Grade NonHodgkin's B- Cell Lymphoma  . Status post chemotherapy 04/18/2012 - 06/20/12     4 Cycles of R-CHOP with 3 Doses of Prophylactic Intrathecal chemo.  . Weight loss 07/11/12 note  . History of colon cancer 01/07/2013   Past Surgical History  Procedure Laterality Date  . Nasal polyp surgery   1983  . Colectomy  2010  . Bladder surgery  2010  . Colonoscopy  2012    next one 2014.   . Biospy left tonsil  03/26/12    Non - Hodgkins B-Cell Lympyhoma   Family History  Problem Relation Age of Onset  . Cancer Father     leukemia  . Cancer Maternal Aunt     leukemia  . Cancer Paternal Grandmother     leukemia   History  Substance Use Topics  . Smoking status: Never Smoker   . Smokeless tobacco: Former Systems developer  . Alcohol Use: No     Comment: Quit 2014    Review of Systems  Constitutional: Positive for chills, diaphoresis, appetite change and fatigue. Negative for fever.  Respiratory: Negative for cough, chest tightness and shortness of breath.   Cardiovascular: Negative for chest pain, palpitations and leg swelling.  Gastrointestinal: Negative for nausea, vomiting, abdominal pain, diarrhea and abdominal distention.  Genitourinary: Negative for dysuria, urgency, frequency and hematuria.  Musculoskeletal: Negative for arthralgias, myalgias, neck pain and neck stiffness.  Skin: Negative for rash.  Allergic/Immunologic: Negative for immunocompromised state.  Neurological: Positive for weakness and headaches. Negative for dizziness, light-headedness and numbness.      Allergies  Review of patient's allergies indicates no known allergies.  Home Medications   Prior to Admission medications   Medication Sig Start Date End Date Taking? Authorizing Provider  acetaminophen (TYLENOL) 500 MG tablet Take 500 mg by mouth every  6 (six) hours as needed (pain).   Yes Historical Provider, MD  albuterol (PROVENTIL HFA;VENTOLIN HFA) 108 (90 BASE) MCG/ACT inhaler Inhale 2 puffs into the lungs every 4 (four) hours as needed. For shortness of breath. 07/28/12  Yes Chipper Herb, MD  cholecalciferol (VITAMIN D) 1000 UNITS tablet Take 1,000 Units by mouth daily.   Yes Historical Provider, MD  Fluticasone-Salmeterol (ADVAIR) 250-50 MCG/DOSE AEPB Inhale 2 puffs into the lungs every morning.     Yes Historical Provider, MD  Multiple Vitamin (MULTIVITAMIN WITH MINERALS) TABS tablet Take 1 tablet by mouth daily.   Yes Historical Provider, MD   BP 132/77  Pulse 95  Temp(Src) 98.5 F (36.9 C) (Oral)  Resp 16  SpO2 100% Physical Exam  Nursing note and vitals reviewed. Constitutional: He appears well-developed and well-nourished. No distress.  HENT:  Head: Normocephalic and atraumatic.  Eyes: Conjunctivae are normal.  Neck: Neck supple.  Cardiovascular: Normal rate, regular rhythm and normal heart sounds.   Pulmonary/Chest: Effort normal. No respiratory distress. He has no wheezes. He has no rales.  Abdominal: Soft. Bowel sounds are normal. He exhibits no distension. There is no tenderness. There is no rebound.  Musculoskeletal: He exhibits no edema.  Neurological: He is alert.  Skin: Skin is warm and dry.    ED Course  Procedures (including critical care time) Labs Review Labs Reviewed  CBC WITH DIFFERENTIAL - Abnormal; Notable for the following:    Neutrophils Relative % 78 (*)    Lymphocytes Relative 10 (*)    Lymphs Abs 0.6 (*)    All other components within normal limits  COMPREHENSIVE METABOLIC PANEL - Abnormal; Notable for the following:    Glucose, Bld 102 (*)    GFR calc non Af Amer 89 (*)    All other components within normal limits  URINALYSIS, ROUTINE W REFLEX MICROSCOPIC - Abnormal; Notable for the following:    Ketones, ur 40 (*)    All other components within normal limits  URINE CULTURE  I-STAT TROPOININ, ED  I-STAT CG4 LACTIC ACID, ED    Imaging Review Dg Chest 2 View  09/19/2013   CLINICAL DATA:  Night sweats, fatigue.  EXAM: CHEST  2 VIEW  COMPARISON:  PET CT 01/20/2013  FINDINGS: Mild hyperinflation of the lungs. Calcified granulomas in the left upper lobe and bilateral hila. Biapical scarring. No confluent airspace opacity or effusion. No acute bony abnormality.  IMPRESSION: COPD/chronic changes.  No active disease.   Electronically Signed   By:  Rolm Baptise M.D.   On: 09/19/2013 16:45   Ct Head Wo Contrast  09/19/2013   CLINICAL DATA:  Weakness. Nausea. Night sweats. Fatigue. History of lymphoma.  EXAM: CT HEAD WITHOUT CONTRAST  TECHNIQUE: Contiguous axial images were obtained from the base of the skull through the vertex without intravenous contrast.  COMPARISON:  CT neck 08/03/2013  FINDINGS: A remote lacunar infarct in the right cerebellum is stable. No acute cortical infarct, hemorrhage, or mass lesion is present. The ventricles are of normal size. No significant extra-axial fluid collection is present.  Chronic sinus disease is present. The patient is status post bilateral maxillary sinus surgery and ethmoidectomies. Circumferential mucosal thickening and wall thickening is present in the sphenoid sinuses bilaterally. Minimal mucosal thickening is present in the frontal sinuses. The mastoid air cells are clear. The osseous skull is intact.  IMPRESSION: 1. Stable remote lacunar infarct of the right cerebellum. 2. No acute intracranial abnormality. 3. Extensive sinus disease with evidence of previous  maxillary and ethmoid sinus surgery.   Electronically Signed   By: Lawrence Santiago M.D.   On: 09/19/2013 19:51     EKG Interpretation None      MDM   Final diagnoses:  Weakness  Headache    Patient emergency department with generalized weakness, nausea, headache, malaise. History of prior bladder, colon cancers, non-Hodgkin's lymphoma. Currently in remission followed up both. She is concerned that he may have return of "some type of cancer." We'll check lab work, CBC, metabolic panel, EKG, troponin, CT head. Urinalysis.   Patient's labs are unremarkable. CT head negative other than sinus disease. Patient was given 2 L of IV fluids because patient states he is dehydrated. Patient's vital signs are all normal. He is afebrile. Patient has an appointment in the clinic for June 16 at noon. Informed patient, he wasn't aware. We'll discharge  home with close followup.  Filed Vitals:   09/19/13 1636 09/19/13 1815 09/19/13 1924 09/19/13 2100  BP: 130/83 136/76 134/77 133/84  Pulse: 81 96 88 93  Temp: 98.4 F (36.9 C)     TempSrc: Oral     Resp: 16 18 16 18   SpO2: 100% 100% 100% 98%      Jahziah Simonin A Hiran Leard, PA-C 09/20/13 0125

## 2013-09-20 LAB — URINE CULTURE

## 2013-09-20 NOTE — ED Notes (Signed)
Pt's daughter called and is concerned that Pt is still having severe headaches.  Sts that they were told that his PCP had prescribed doxycycline, but the Pt knew nothing about that and their pharmacy had not received a prescription.  This RN reiterated that he was directed to alternate Tylenol and ibuprofen.  Pt's daughter sts that those medications are not helping.  This RN informed her that if he feels like he needs to be seen again then we would be happy to see him again.  Daughter asked if we could "give him a shot or something to get him through until his appointment on Tuesday."  This RN informed her that I can not guarantee what treatment he will receive, but, again, we would be happy to see him again.  Pt's daughter verbalized understanding.

## 2013-09-20 NOTE — ED Provider Notes (Signed)
Medical screening examination/treatment/procedure(s) were performed by non-physician practitioner and as supervising physician I was immediately available for consultation/collaboration.    Neta Ehlers, MD 09/20/13 1139

## 2013-09-21 ENCOUNTER — Telehealth: Payer: Self-pay | Admitting: Hematology and Oncology

## 2013-09-21 ENCOUNTER — Telehealth: Payer: Self-pay | Admitting: *Deleted

## 2013-09-21 ENCOUNTER — Telehealth: Payer: Self-pay | Admitting: General Practice

## 2013-09-21 LAB — LYME, WESTERN BLOT, SERUM (REFLEXED)
IgG P18 Ab.: ABSENT
IgG P28 Ab.: ABSENT
IgG P30 Ab.: ABSENT
IgG P39 Ab.: ABSENT
IgG P45 Ab.: ABSENT
IgG P58 Ab.: ABSENT
IgG P66 Ab.: ABSENT
IgG P93 Ab.: ABSENT
IgM P23 Ab.: ABSENT
IgM P39 Ab.: ABSENT
IgM P41 Ab.: ABSENT
Lyme IgG Wb: NEGATIVE
Lyme IgM Wb: NEGATIVE

## 2013-09-21 LAB — LYME AB/WESTERN BLOT REFLEX
LYME DISEASE AB, QUANT, IGM: <0.8 index (ref 0.00–0.79)
LYME IGG/IGM AB: 0.93 {ISR} — AB (ref 0.00–0.90)

## 2013-09-21 LAB — ROCKY MTN SPOTTED FVR ABS PNL(IGG+IGM)
RMSF IgG: POSITIVE — AB
RMSF IgM: 0.17 {index} (ref 0.00–0.89)

## 2013-09-21 LAB — RMSF, IGG, IFA: RMSF, IGG, IFA: 1:64 {titer} — ABNORMAL HIGH

## 2013-09-21 MED ORDER — DOXYCYCLINE HYCLATE 100 MG PO TABS: 100.0000 mg | ORAL_TABLET | Freq: Two times a day (BID) | ORAL | Status: DC

## 2013-09-21 NOTE — Telephone Encounter (Signed)
, °

## 2013-09-21 NOTE — Telephone Encounter (Signed)
Pt called with fever, night sweats, malagias and nausea Per Dr Laurance Flatten will go ahead and treat with Doxyycline for 2 weeks

## 2013-09-21 NOTE — Telephone Encounter (Signed)
POF sent to add pt to see Dr. Alvy Bimler tomorrow at 12 noon.  Left VM for daughter informing of appt and to please call back to confirm.

## 2013-09-22 ENCOUNTER — Ambulatory Visit (HOSPITAL_BASED_OUTPATIENT_CLINIC_OR_DEPARTMENT_OTHER): Payer: Medicare Other | Admitting: Hematology and Oncology

## 2013-09-22 ENCOUNTER — Encounter: Payer: Self-pay | Admitting: Hematology and Oncology

## 2013-09-22 VITALS — BP 119/72 | HR 108 | Temp 98.7°F | Resp 18 | Ht 69.0 in | Wt 140.8 lb

## 2013-09-22 DIAGNOSIS — R51 Headache: Secondary | ICD-10-CM

## 2013-09-22 DIAGNOSIS — Z87898 Personal history of other specified conditions: Secondary | ICD-10-CM

## 2013-09-22 DIAGNOSIS — R519 Headache, unspecified: Secondary | ICD-10-CM | POA: Insufficient documentation

## 2013-09-22 DIAGNOSIS — R509 Fever, unspecified: Secondary | ICD-10-CM

## 2013-09-22 DIAGNOSIS — R61 Generalized hyperhidrosis: Secondary | ICD-10-CM

## 2013-09-22 DIAGNOSIS — C8599 Non-Hodgkin lymphoma, unspecified, extranodal and solid organ sites: Secondary | ICD-10-CM

## 2013-09-22 NOTE — Assessment & Plan Note (Signed)
Clinically, he has no evidence of disease. I reassured the patient. I do not feel that he needs imaging study unless his symptoms do not improve with antibiotics.

## 2013-09-22 NOTE — Assessment & Plan Note (Signed)
He has contact exposure to Lyme disease. I recommend a trial of antibiotics with doxycycline. If he does not improve in 2 weeks, the patient will call me and I will obtain further tests and investigations.

## 2013-09-22 NOTE — Assessment & Plan Note (Signed)
CT scan of the head is negative for disease. The is evidence of sinusities. If his headache does not improve within the next 2 weeks, I will refer him back to ENT for evaluation.

## 2013-09-22 NOTE — Progress Notes (Signed)
[facility] OFFICE PROGRESS NOTE  Patient Care Team: Chipper Herb, MD as PCP - General (Family Medicine) Melida Quitter, MD as Attending Physician (Otolaryngology) Burke Keels, MD (General Surgery) Miguel Dibble, MD as Attending Physician (Urology)   SUMMARY OF ONCOLOGIC HISTORY: #1 history of follicular lymphoma of the left tonsil, high grade, status post R. CHOP chemotherapy and radiation therapy #2 history of bladder cancer in 2010 and local recurrence in 2013, on observation #3 stage II colon cancer status post hemicolectomy in 2010 without adjuvant treatment, on observation  INTERVAL HISTORY: Please see below for problem oriented charting. The patient asked for urgent evaluation for low-grade fever, night sweats, body aches and headaches. This has been going on for the last 7-10 days. He was prescribed antibiotics by his primary care provider yesterday. Denies any new lymphadenopathy.  REVIEW OF SYSTEMS:   Eyes: Denies blurriness of vision Ears, nose, mouth, throat, and face: Denies mucositis or sore throat Respiratory: Denies cough, dyspnea or wheezes Cardiovascular: Denies palpitation, chest discomfort or lower extremity swelling Gastrointestinal:  Denies nausea, heartburn or change in bowel habits Skin: Denies abnormal skin rashes Lymphatics: Denies new lymphadenopathy or easy bruising Neurological:Denies numbness, tingling or new weaknesses Behavioral/Psych: Mood is stable, no new changes  All other systems were reviewed with the patient and are negative.  I have reviewed the past medical history, past surgical history, social history and family history with the patient and they are unchanged from previous note.  ALLERGIES:  has No Known Allergies.  MEDICATIONS:  Current Outpatient Prescriptions  Medication Sig Dispense Refill  . acetaminophen (TYLENOL) 500 MG tablet Take 500 mg by mouth every 6 (six) hours as needed (pain).      Marland Kitchen albuterol  (PROVENTIL HFA;VENTOLIN HFA) 108 (90 BASE) MCG/ACT inhaler Inhale 2 puffs into the lungs every 4 (four) hours as needed. For shortness of breath.  1 Inhaler  2  . cholecalciferol (VITAMIN D) 1000 UNITS tablet Take 1,000 Units by mouth daily.      Marland Kitchen doxycycline (VIBRA-TABS) 100 MG tablet Take 1 tablet (100 mg total) by mouth 2 (two) times daily.  24 tablet  0  . Fluticasone-Salmeterol (ADVAIR) 250-50 MCG/DOSE AEPB Inhale 2 puffs into the lungs every morning.       . Multiple Vitamin (MULTIVITAMIN WITH MINERALS) TABS tablet Take 1 tablet by mouth daily.      . ondansetron (ZOFRAN ODT) 8 MG disintegrating tablet Take 1 tablet (8 mg total) by mouth every 8 (eight) hours as needed for nausea or vomiting.  20 tablet  0   No current facility-administered medications for this visit.    PHYSICAL EXAMINATION: ECOG PERFORMANCE STATUS: 1 - Symptomatic but completely ambulatory  Filed Vitals:   09/22/13 1154  BP: 119/72  Pulse: 108  Temp: 98.7 F (37.1 C)  Resp: 18   Filed Weights   09/22/13 1154  Weight: 140 lb 12.8 oz (63.866 kg)    GENERAL:alert, no distress and comfortable. He looks thin and cachectic SKIN: skin color, texture, turgor are normal, no rashes or significant lesions EYES: normal, Conjunctiva are pink and non-injected, sclera clear OROPHARYNX:no exudate, no erythema and lips, buccal mucosa, and tongue normal  NECK: supple, thyroid normal size, non-tender, without nodularity LYMPH:  no palpable lymphadenopathy in the cervical, axillary or inguinal LUNGS: clear to auscultation and percussion with normal breathing effort HEART: regular rate & rhythm and no murmurs and no lower extremity edema ABDOMEN:abdomen soft, non-tender and normal bowel sounds Musculoskeletal:no cyanosis of digits  and no clubbing  NEURO: alert & oriented x 3 with fluent speech, no focal motor/sensory deficits  LABORATORY DATA:  I have reviewed the data as listed    Component Value Date/Time   NA 137  09/19/2013 1614   NA 141 08/03/2013 0925   K 4.4 09/19/2013 1614   K 4.1 08/03/2013 0925   CL 98 09/19/2013 1614   CL 107 07/11/2012 0817   CO2 24 09/19/2013 1614   CO2 22 08/03/2013 0925   GLUCOSE 102* 09/19/2013 1614   GLUCOSE 93 08/03/2013 0925   GLUCOSE 96 07/11/2012 0817   BUN 8 09/19/2013 1614   BUN 13.1 08/03/2013 0925   CREATININE 0.78 09/19/2013 1614   CREATININE 0.8 08/03/2013 0925   CALCIUM 9.3 09/19/2013 1614   CALCIUM 9.5 08/03/2013 0925   PROT 7.5 09/19/2013 1614   PROT 6.9 04/16/2013 0856   ALBUMIN 3.9 09/19/2013 1614   ALBUMIN 4.0 04/16/2013 0856   AST 21 09/19/2013 1614   AST 19 04/16/2013 0856   ALT 13 09/19/2013 1614   ALT 12 04/16/2013 0856   ALKPHOS 71 09/19/2013 1614   ALKPHOS 54 04/16/2013 0856   BILITOT 1.1 09/19/2013 1614   BILITOT 1.32* 04/16/2013 0856   GFRNONAA 89* 09/19/2013 1614   GFRAA >90 09/19/2013 1614    No results found for this basename: SPEP,  UPEP,   kappa and lambda light chains    Lab Results  Component Value Date   WBC 5.8 09/19/2013   NEUTROABS 4.5 09/19/2013   HGB 14.4 09/19/2013   HCT 40.0 09/19/2013   MCV 89.5 09/19/2013   PLT 156 09/19/2013      Chemistry      Component Value Date/Time   NA 137 09/19/2013 1614   NA 141 08/03/2013 0925   K 4.4 09/19/2013 1614   K 4.1 08/03/2013 0925   CL 98 09/19/2013 1614   CL 107 07/11/2012 0817   CO2 24 09/19/2013 1614   CO2 22 08/03/2013 0925   BUN 8 09/19/2013 1614   BUN 13.1 08/03/2013 0925   CREATININE 0.78 09/19/2013 1614   CREATININE 0.8 08/03/2013 0925      Component Value Date/Time   CALCIUM 9.3 09/19/2013 1614   CALCIUM 9.5 08/03/2013 0925   ALKPHOS 71 09/19/2013 1614   ALKPHOS 54 04/16/2013 0856   AST 21 09/19/2013 1614   AST 19 04/16/2013 0856   ALT 13 09/19/2013 1614   ALT 12 04/16/2013 0856   BILITOT 1.1 09/19/2013 1614   BILITOT 1.32* 04/16/2013 0856       RADIOGRAPHIC STUDIES: I reviewed the CT head done last week I have personally reviewed the radiological images as listed and agreed with the findings in the  report.   ASSESSMENT & PLAN:  Non-Hodgkin's lymphoma of tonsil Clinically, he has no evidence of disease. I reassured the patient. I do not feel that he needs imaging study unless his symptoms do not improve with antibiotics.  Headache CT scan of the head is negative for disease. The is evidence of sinusities. If his headache does not improve within the next 2 weeks, I will refer him back to ENT for evaluation.  Fever He has contact exposure to Lyme disease. I recommend a trial of antibiotics with doxycycline. If he does not improve in 2 weeks, the patient will call me and I will obtain further tests and investigations.     All questions were answered. The patient knows to call the clinic with any problems, questions or concerns.  No barriers to learning was detected. I spent 15 minutes counseling the patient face to face. The total time spent in the appointment was 20 minutes and more than 50% was on counseling and review of test results     [facility], [place], MD 09/22/2013 12:31 PM

## 2013-09-23 ENCOUNTER — Telehealth: Payer: Self-pay | Admitting: *Deleted

## 2013-09-23 NOTE — Telephone Encounter (Signed)
Message copied by Marin Olp on Wed Sep 23, 2013 10:30 AM ------      Message from: Chipper Herb      Created: Mon Sep 21, 2013  6:57 PM       Please send a copy of all of this blood work to his oncologist.       The CBC had a white blood cell count that was slightly decreased but consistent with past readings. The hemoglobin was good at 14.5. The platelet count was adequate      The test for influenza A and B. were both negative      The test for [facility] spotted fever had a titer of 164 this is considered to be a recent or active infection.      The test for Lyme disease was not positive for infection      A. prescription for this patient was called in for doxycycline today to take twice daily for 2 weeks      The patient was made aware of all of these test results except for the [facility] spotted fever result. Please make sure that the patient is made aware of this. ------

## 2013-09-23 NOTE — Telephone Encounter (Signed)
Pt notified of results Pt stated he is feeling much better after starting antibiotics Copy routed to oncologist

## 2013-10-29 ENCOUNTER — Ambulatory Visit (INDEPENDENT_AMBULATORY_CARE_PROVIDER_SITE_OTHER): Payer: Medicare Other | Admitting: Family Medicine

## 2013-10-29 VITALS — BP 139/82 | HR 78 | Temp 97.8°F | Ht 69.75 in | Wt 144.2 lb

## 2013-10-29 DIAGNOSIS — R21 Rash and other nonspecific skin eruption: Secondary | ICD-10-CM

## 2013-10-29 MED ORDER — METHYLPREDNISOLONE (PAK) 4 MG PO TABS
ORAL_TABLET | ORAL | Status: DC
Start: 2013-10-29 — End: 2014-01-19

## 2013-10-29 MED ORDER — METHYLPREDNISOLONE ACETATE 80 MG/ML IJ SUSP
80.0000 mg | Freq: Once | INTRAMUSCULAR | Status: AC
  Administered 2013-10-29: 80 mg via INTRAMUSCULAR

## 2013-10-30 NOTE — Progress Notes (Signed)
   Subjective:    Patient ID: Kenneth Reed, male    DOB: [birth year], 68 y.o.   MRN: [id number]  HPI  This 68 y.o. male presents for evaluation of rash on bilateral lower extremities.  He has been outside walking in the yard and thinks the rash came from some environmental contact.  Review of Systems    No chest pain, SOB, HA, dizziness, vision change, N/V, diarrhea, constipation, dysuria, urinary urgency or frequency, myalgias, arthralgias or rash.  Objective:   Physical Exam  Vital signs noted  Well developed well nourished male.  HEENT - Head atraumatic Normocephalic Respiratory - Lungs CTA bilateral Cardiac - RRR S1 and S2 without murmur GI - Abdomen soft Nontender and bowel sounds active x 4 Extremities - No edema. Neuro - Grossly intact. Skin - macular papular rash on lower extremities      Assessment & Plan:  Rash and nonspecific skin eruption - Plan: methylPREDNISolone acetate (DEPO-MEDROL) injection 80 mg, methylPREDNIsolone (MEDROL DOSPACK) 4 MG tablet  Lysbeth Penner FNP

## 2014-01-19 ENCOUNTER — Ambulatory Visit (INDEPENDENT_AMBULATORY_CARE_PROVIDER_SITE_OTHER): Payer: Medicare Other | Admitting: Family

## 2014-01-19 VITALS — BP 140/84 | HR 76 | Temp 98.0°F | Ht 69.75 in | Wt 148.0 lb

## 2014-01-19 DIAGNOSIS — J069 Acute upper respiratory infection, unspecified: Secondary | ICD-10-CM

## 2014-01-19 DIAGNOSIS — H8110 Benign paroxysmal vertigo, unspecified ear: Secondary | ICD-10-CM

## 2014-01-19 MED ORDER — MECLIZINE HCL 25 MG PO TABS
25.0000 mg | ORAL_TABLET | Freq: Three times a day (TID) | ORAL | Status: DC | PRN
Start: 2014-01-19 — End: 2014-08-10

## 2014-01-19 MED ORDER — AMOXICILLIN 500 MG PO CAPS: 500.0000 mg | ORAL_CAPSULE | Freq: Three times a day (TID) | ORAL | Status: DC

## 2014-01-19 MED ORDER — AMOXICILLIN 500 MG PO CAPS: 500.0000 mg | ORAL_CAPSULE | Freq: Two times a day (BID) | ORAL | Status: DC

## 2014-01-19 NOTE — Patient Instructions (Addendum)
Benign Positional Vertigo Vertigo means you feel like you or your surroundings are moving when they are not. Benign positional vertigo is the most common form of vertigo. Benign means that the cause of your condition is not serious. Benign positional vertigo is more common in older adults. CAUSES  Benign positional vertigo is the result of an upset in the labyrinth system. This is an area in the middle ear that helps control your balance. This may be caused by a viral infection, head injury, or repetitive motion. However, often no specific cause is found. SYMPTOMS  Symptoms of benign positional vertigo occur when you move your head or eyes in different directions. Some of the symptoms may include:  Loss of balance and falls.  Vomiting.  Blurred vision.  Dizziness.  Nausea.  Involuntary eye movements (nystagmus). DIAGNOSIS  Benign positional vertigo is usually diagnosed by physical exam. If the specific cause of your benign positional vertigo is unknown, your caregiver may perform imaging tests, such as magnetic resonance imaging (MRI) or computed tomography (CT). TREATMENT  Your caregiver may recommend movements or procedures to correct the benign positional vertigo. Medicines such as meclizine, benzodiazepines, and medicines for nausea may be used to treat your symptoms. In rare cases, if your symptoms are caused by certain conditions that affect the inner ear, you may need surgery. HOME CARE INSTRUCTIONS   Follow your caregiver's instructions.  Move slowly. Do not make sudden body or head movements.  Avoid driving.  Avoid operating heavy machinery.  Avoid performing any tasks that would be dangerous to you or others during a vertigo episode.  Drink enough fluids to keep your urine clear or pale yellow. SEEK IMMEDIATE MEDICAL CARE IF:   You develop problems with walking, weakness, numbness, or using your arms, hands, or legs.  You have difficulty speaking.  You develop  severe headaches.  Your nausea or vomiting continues or gets worse.  You develop visual changes.  Your family or friends notice any behavioral changes.  Your condition gets worse.  You have a fever.  You develop a stiff neck or sensitivity to light. MAKE SURE YOU:   Understand these instructions.  Will watch your condition.  Will get help right away if you are not doing well or get worse. Document Released: 01/01/2006 Document Revised: 06/18/2011 Document Reviewed: 12/14/2010 [facility] Patient Information 2015 [place], [place]. This information is not intended to replace advice given to you by your health care provider. Make sure you discuss any questions you have with your health care provider. Upper Respiratory Infection, Adult An upper respiratory infection (URI) is also known as the common cold. It is often caused by a type of germ (virus). Colds are easily spread (contagious). You can pass it to others by kissing, coughing, sneezing, or drinking out of the same glass. Usually, you get better in 1 or 2 weeks.  HOME CARE   Only take medicine as told by your doctor.  Use a warm mist humidifier or breathe in steam from a hot shower.  Drink enough water and fluids to keep your pee (urine) clear or pale yellow.  Get plenty of rest.  Return to work when your temperature is back to normal or as told by your doctor. You may use a face mask and wash your hands to stop your cold from spreading. GET HELP RIGHT AWAY IF:   After the first few days, you feel you are getting worse.  You have questions about your medicine.  You have chills, shortness  of breath, or brown or red spit (mucus).  You have yellow or brown snot (nasal discharge) or pain in the face, especially when you bend forward.  You have a fever, puffy (swollen) neck, pain when you swallow, or white spots in the back of your throat.  You have a bad headache, ear pain, sinus pain, or chest pain.  You have a  high-pitched whistling sound when you breathe in and out (wheezing).  You have a lasting cough or cough up blood.  You have sore muscles or a stiff neck. MAKE SURE YOU:   Understand these instructions.  Will watch your condition.  Will get help right away if you are not doing well or get worse. Document Released: 09/12/2007 Document Revised: 06/18/2011 Document Reviewed: 07/01/2013 [facility] Patient Information 2015 [place], [place]. This information is not intended to replace advice given to you by your health care provider. Make sure you discuss any questions you have with your health care provider.  - Take meds as prescribed - Use a cool mist humidifier  -Use saline nose sprays frequently -Saline irrigations of the nose can be very helpful if done frequently.  * 4X daily for 1 week*  * Use of a nettie pot can be helpful with this. Follow directions with this* -Force fluids -For any cough or congestion  Use plain Mucinex- regular strength or max strength is fine   * Children- consult with Pharmacist for dosing -For fever or aces or pains- take tylenol or ibuprofen appropriate for age and weight.  * for fevers greater than 101 orally you may alternate ibuprofen and tylenol every  3 hours. -Throat lozenges if help    Evelina Dun, FNP

## 2014-01-19 NOTE — Progress Notes (Signed)
   Subjective:    Patient ID: Kenneth Reed, male    DOB: 09/17/1945, 68 y.o.   MRN: [id number]  Dizziness This is a new problem. The current episode started in the past 7 days ("Thursday"). The problem occurs intermittently. The problem has been unchanged. Pertinent negatives include no abdominal pain, chills, congestion, coughing, fever, headaches, nausea, sore throat or visual change. Exacerbated by: "Laying down and getting up" He has tried nothing for the symptoms. The treatment provided no relief.      Review of Systems  Constitutional: Negative.  Negative for fever and chills.  HENT: Negative.  Negative for congestion and sore throat.   Respiratory: Negative.  Negative for cough.   Cardiovascular: Negative.   Gastrointestinal: Negative.  Negative for nausea and abdominal pain.  Endocrine: Negative.   Genitourinary: Negative.   Musculoskeletal: Negative.   Neurological: Positive for dizziness. Negative for headaches.  Hematological: Negative.   Psychiatric/Behavioral: Negative.   All other systems reviewed and are negative.      Objective:   Physical Exam  Vitals reviewed. Constitutional: He is oriented to person, place, and time. He appears well-developed and well-nourished. No distress.  HENT:  Head: Normocephalic.  Right Ear: External ear normal.  Left Ear: External ear normal.  Nasal passage erythemas with mild swelling Oropharynx erythemas  Eyes: Pupils are equal, round, and reactive to light. Right eye exhibits no discharge. Left eye exhibits no discharge.  Neck: Normal range of motion. Neck supple. No thyromegaly present.  Cardiovascular: Normal rate, regular rhythm, normal heart sounds and intact distal pulses.   No murmur heard. Pulmonary/Chest: Effort normal and breath sounds normal. No respiratory distress. He has no wheezes.  Abdominal: Soft. Bowel sounds are normal. He exhibits no distension. There is no tenderness.  Musculoskeletal: Normal range of motion.  He exhibits no edema and no tenderness.  Neurological: He is alert and oriented to person, place, and time. He has normal reflexes. No cranial nerve deficit.  Skin: Skin is warm and dry. No rash noted. No erythema.  Psychiatric: He has a normal mood and affect. His behavior is normal. Judgment and thought content normal.    BP 140/84  Pulse 76  Temp(Src) 98 F (36.7 C) (Oral)  Ht 5' 9.75" (1.772 m)  Wt 148 lb (67.132 kg)  BMI 21.38 kg/m2       Assessment & Plan:  1. URI (upper respiratory infection) -- Take meds as prescribed - Use a cool mist humidifier  -Use saline nose sprays frequently -Saline irrigations of the nose can be very helpful if done frequently.  * 4X daily for 1 week*  * Use of a nettie pot can be helpful with this. Follow directions with this* -Force fluids -For any cough or congestion  Use plain Mucinex- regular strength or max strength is fine   * Children- consult with Pharmacist for dosing -For fever or aces or pains- take tylenol or ibuprofen appropriate for age and weight.  * for fevers greater than 101 orally you may alternate ibuprofen and tylenol every  3 hours. -Throat lozenges if help - amoxicillin (AMOXIL) 500 MG capsule; Take 1 capsule (500 mg total) by mouth 3 (three) times daily.  Dispense: 14 capsule; Refill: 0  2. Benign paroxysmal positional vertigo, unspecified laterality -Falls precaution discussed - meclizine (ANTIVERT) 25 MG tablet; Take 1 tablet (25 mg total) by mouth 3 (three) times daily as needed for dizziness.  Dispense: 30 tablet; Refill: 0  Evelina Dun, FNP

## 2014-02-09 ENCOUNTER — Telehealth: Payer: Self-pay | Admitting: Hematology and Oncology

## 2014-02-09 ENCOUNTER — Ambulatory Visit (HOSPITAL_BASED_OUTPATIENT_CLINIC_OR_DEPARTMENT_OTHER): Payer: Medicare Other | Admitting: Hematology and Oncology

## 2014-02-09 ENCOUNTER — Encounter: Payer: Self-pay | Admitting: Hematology and Oncology

## 2014-02-09 ENCOUNTER — Other Ambulatory Visit (HOSPITAL_BASED_OUTPATIENT_CLINIC_OR_DEPARTMENT_OTHER): Payer: Medicare Other

## 2014-02-09 VITALS — BP 122/77 | HR 99 | Temp 98.6°F | Resp 18 | Ht 69.0 in | Wt 149.2 lb

## 2014-02-09 DIAGNOSIS — R42 Dizziness and giddiness: Secondary | ICD-10-CM | POA: Insufficient documentation

## 2014-02-09 DIAGNOSIS — C8599 Non-Hodgkin lymphoma, unspecified, extranodal and solid organ sites: Secondary | ICD-10-CM

## 2014-02-09 DIAGNOSIS — H9193 Unspecified hearing loss, bilateral: Secondary | ICD-10-CM | POA: Insufficient documentation

## 2014-02-09 DIAGNOSIS — Z8579 Personal history of other malignant neoplasms of lymphoid, hematopoietic and related tissues: Secondary | ICD-10-CM

## 2014-02-09 DIAGNOSIS — Z85038 Personal history of other malignant neoplasm of large intestine: Secondary | ICD-10-CM

## 2014-02-09 DIAGNOSIS — Z23 Encounter for immunization: Secondary | ICD-10-CM

## 2014-02-09 DIAGNOSIS — Z8551 Personal history of malignant neoplasm of bladder: Secondary | ICD-10-CM

## 2014-02-09 LAB — COMPREHENSIVE METABOLIC PANEL (CC13)
ALT: 14 U/L (ref 0–55)
ANION GAP: 9 meq/L (ref 3–11)
AST: 17 U/L (ref 5–34)
Albumin: 4.2 g/dL (ref 3.5–5.0)
Alkaline Phosphatase: 64 U/L (ref 40–150)
BUN: 7.1 mg/dL (ref 7.0–26.0)
CO2: 25 mEq/L (ref 22–29)
CREATININE: 1 mg/dL (ref 0.7–1.3)
Calcium: 9.2 mg/dL (ref 8.4–10.4)
Chloride: 106 mEq/L (ref 98–109)
Glucose: 97 mg/dl (ref 70–140)
Potassium: 4.1 mEq/L (ref 3.5–5.1)
Sodium: 140 mEq/L (ref 136–145)
Total Bilirubin: 0.82 mg/dL (ref 0.20–1.20)
Total Protein: 7.2 g/dL (ref 6.4–8.3)

## 2014-02-09 LAB — CBC WITH DIFFERENTIAL/PLATELET
BASO%: 0.7 % (ref 0.0–2.0)
BASOS ABS: 0 10*3/uL (ref 0.0–0.1)
EOS ABS: 0.1 10*3/uL (ref 0.0–0.5)
EOS%: 2.2 % (ref 0.0–7.0)
HEMATOCRIT: 44.6 % (ref 38.4–49.9)
HGB: 15.5 g/dL (ref 13.0–17.1)
LYMPH#: 0.7 10*3/uL — AB (ref 0.9–3.3)
LYMPH%: 17.2 % (ref 14.0–49.0)
MCH: 32.9 pg (ref 27.2–33.4)
MCHC: 34.6 g/dL (ref 32.0–36.0)
MCV: 94.8 fL (ref 79.3–98.0)
MONO#: 0.2 10*3/uL (ref 0.1–0.9)
MONO%: 6 % (ref 0.0–14.0)
NEUT%: 73.9 % (ref 39.0–75.0)
NEUTROS ABS: 2.8 10*3/uL (ref 1.5–6.5)
Platelets: 217 10*3/uL (ref 140–400)
RBC: 4.7 10*6/uL (ref 4.20–5.82)
RDW: 12.7 % (ref 11.0–14.6)
WBC: 3.8 10*3/uL — ABNORMAL LOW (ref 4.0–10.3)

## 2014-02-09 LAB — LACTATE DEHYDROGENASE (CC13): LDH: 142 U/L (ref 125–245)

## 2014-02-09 MED ORDER — INFLUENZA VAC SPLIT QUAD 0.5 ML IM SUSY
0.5000 mL | PREFILLED_SYRINGE | Freq: Once | INTRAMUSCULAR | Status: AC
  Administered 2014-02-09: 0.5 mL via INTRAMUSCULAR
  Filled 2014-02-09: qty 0.5

## 2014-02-09 NOTE — Telephone Encounter (Signed)
Gave avs & cal for May 2016. Referral set up for Nov 5 @ 2:30 arrival time. Confirm d/t w/ pt.

## 2014-02-09 NOTE — Progress Notes (Signed)
[place] OFFICE PROGRESS NOTE  Patient Care Team: Chipper Herb, MD as PCP - General (Family Medicine) Melida Quitter, MD as Attending Physician (Otolaryngology) Burke Keels, MD (General Surgery) Miguel Dibble, MD as Attending Physician (Urology) SUMMARY OF ONCOLOGIC HISTORY: #1 history of follicular lymphoma of the left tonsil, high grade, status post R. CHOP chemotherapy and radiation therapy #2 history of bladder cancer in 2010 and local recurrence in 2013, on observation #3 stage II colon cancer status post hemicolectomy in 2010 without adjuvant treatment, on observation  INTERVAL HISTORY: Please see below for problem oriented charting. He has recurrent infection and has been on antibiotics on and off. He also complaining of hearing loss and vertigo. Denies new lymphadenopathy.  REVIEW OF SYSTEMS:   Constitutional: Denies fevers, chills or abnormal weight loss Eyes: Denies blurriness of vision Ears, nose, mouth, throat, and face: Denies mucositis or sore throat Respiratory: Denies cough, dyspnea or wheezes Cardiovascular: Denies palpitation, chest discomfort or lower extremity swelling Gastrointestinal:  Denies nausea, heartburn or change in bowel habits Skin: Denies abnormal skin rashes Lymphatics: Denies new lymphadenopathy or easy bruising Neurological:Denies numbness, tingling or new weaknesses Behavioral/Psych: Mood is stable, no new changes  All other systems were reviewed with the patient and are negative.  I have reviewed the past medical history, past surgical history, social history and family history with the patient and they are unchanged from previous note.  ALLERGIES:  has No Known Allergies.  MEDICATIONS:  Current Outpatient Prescriptions  Medication Sig Dispense Refill  . acetaminophen (TYLENOL) 500 MG tablet Take 500 mg by mouth every 6 (six) hours as needed (pain).    Marland Kitchen albuterol (PROVENTIL HFA;VENTOLIN HFA) 108 (90 BASE) MCG/ACT inhaler  Inhale 2 puffs into the lungs every 4 (four) hours as needed. For shortness of breath. 1 Inhaler 2  . cholecalciferol (VITAMIN D) 1000 UNITS tablet Take 1,000 Units by mouth daily.    . Fluticasone-Salmeterol (ADVAIR) 250-50 MCG/DOSE AEPB Inhale 2 puffs into the lungs every morning.     . meclizine (ANTIVERT) 25 MG tablet Take 1 tablet (25 mg total) by mouth 3 (three) times daily as needed for dizziness. 30 tablet 0  . Multiple Vitamin (MULTIVITAMIN WITH MINERALS) TABS tablet Take 1 tablet by mouth daily.    . ondansetron (ZOFRAN ODT) 8 MG disintegrating tablet Take 1 tablet (8 mg total) by mouth every 8 (eight) hours as needed for nausea or vomiting. 20 tablet 0  . tamsulosin (FLOMAX) 0.4 MG CAPS capsule Take 0.4 mg by mouth.     No current facility-administered medications for this visit.    PHYSICAL EXAMINATION: ECOG PERFORMANCE STATUS: 0 - Asymptomatic  Filed Vitals:   02/09/14 0850  BP: 122/77  Pulse: 99  Temp: 98.6 F (37 C)  Resp: 18   Filed Weights   02/09/14 0850  Weight: 149 lb 3.2 oz (67.677 kg)    GENERAL:alert, no distress and comfortable. He looks thin SKIN: skin color, texture, turgor are normal, no rashes or significant lesions EYES: normal, Conjunctiva are pink and non-injected, sclera clear OROPHARYNX:no exudate, no erythema and lips, buccal mucosa, and tongue normal  NECK: supple, thyroid normal size, non-tender, without nodularity LYMPH:  no palpable lymphadenopathy in the cervical, axillary or inguinal LUNGS: clear to auscultation and percussion with normal breathing effort HEART: regular rate & rhythm and no murmurs and no lower extremity edema ABDOMEN:abdomen soft, non-tender and normal bowel sounds Musculoskeletal:no cyanosis of digits and no clubbing  NEURO: alert & oriented x 3  with fluent speech, no focal motor/sensory deficits. Severe hearing loss is noted  LABORATORY DATA:  I have reviewed the data as listed    Component Value Date/Time   NA 140  02/09/2014 0839   NA 137 09/19/2013 1614   K 4.1 02/09/2014 0839   K 4.4 09/19/2013 1614   CL 98 09/19/2013 1614   CL 107 07/11/2012 0817   CO2 25 02/09/2014 0839   CO2 24 09/19/2013 1614   GLUCOSE 97 02/09/2014 0839   GLUCOSE 102* 09/19/2013 1614   GLUCOSE 96 07/11/2012 0817   BUN 7.1 02/09/2014 0839   BUN 8 09/19/2013 1614   CREATININE 1.0 02/09/2014 0839   CREATININE 0.78 09/19/2013 1614   CALCIUM 9.2 02/09/2014 0839   CALCIUM 9.3 09/19/2013 1614   PROT 7.2 02/09/2014 0839   PROT 7.5 09/19/2013 1614   ALBUMIN 4.2 02/09/2014 0839   ALBUMIN 3.9 09/19/2013 1614   AST 17 02/09/2014 0839   AST 21 09/19/2013 1614   ALT 14 02/09/2014 0839   ALT 13 09/19/2013 1614   ALKPHOS 64 02/09/2014 0839   ALKPHOS 71 09/19/2013 1614   BILITOT 0.82 02/09/2014 0839   BILITOT 1.1 09/19/2013 1614   GFRNONAA 89* 09/19/2013 1614   GFRAA >90 09/19/2013 1614    No results found for: SPEP, UPEP  Lab Results  Component Value Date   WBC 3.8* 02/09/2014   NEUTROABS 2.8 02/09/2014   HGB 15.5 02/09/2014   HCT 44.6 02/09/2014   MCV 94.8 02/09/2014   PLT 217 02/09/2014      Chemistry      Component Value Date/Time   NA 140 02/09/2014 0839   NA 137 09/19/2013 1614   K 4.1 02/09/2014 0839   K 4.4 09/19/2013 1614   CL 98 09/19/2013 1614   CL 107 07/11/2012 0817   CO2 25 02/09/2014 0839   CO2 24 09/19/2013 1614   BUN 7.1 02/09/2014 0839   BUN 8 09/19/2013 1614   CREATININE 1.0 02/09/2014 0839   CREATININE 0.78 09/19/2013 1614      Component Value Date/Time   CALCIUM 9.2 02/09/2014 0839   CALCIUM 9.3 09/19/2013 1614   ALKPHOS 64 02/09/2014 0839   ALKPHOS 71 09/19/2013 1614   AST 17 02/09/2014 0839   AST 21 09/19/2013 1614   ALT 14 02/09/2014 0839   ALT 13 09/19/2013 1614   BILITOT 0.82 02/09/2014 0839   BILITOT 1.1 09/19/2013 1614      ASSESSMENT & PLAN:  Non-Hodgkin's lymphoma of tonsil Clinically, he has no signs of recurrence. I will see him back again in 6 months with  history, physical examination and blood work and would defer imaging studies unless he have anything to suggest disease recurrence.  Need for prophylactic vaccination and inoculation against influenza We discussed the importance of preventive care and reviewed the vaccination programs. He does not have any prior allergic reactions to influenza vaccination. He agrees to proceed with influenza vaccination today and we will administer it today at the clinic.   Hearing loss of both ears He has severe bilateral hearing loss and vertigo. I recommend ENT consultation.  Vertigo He has vertigo and recurrent ear infection. I recommend ENT consultation and he agreed.   Orders Placed This Encounter  Procedures  . CBC with Differential    Standing Status: Future     Number of Occurrences:      Standing Expiration Date: 03/16/2015  . Comprehensive metabolic panel    Standing Status: Future  Number of Occurrences:      Standing Expiration Date: 03/16/2015  . Lactate dehydrogenase    Standing Status: Future     Number of Occurrences:      Standing Expiration Date: 03/16/2015  . Ambulatory referral to ENT    Referral Priority:  Routine    Referral Type:  Consultation    Referral Reason:  Specialty Services Required    Referred to Provider:  Melida Quitter, MD    Requested Specialty:  Otolaryngology    Number of Visits Requested:  1   All questions were answered. The patient knows to call the clinic with any problems, questions or concerns. No barriers to learning was detected. I spent 25 minutes counseling the patient face to face. The total time spent in the appointment was 30 minutes and more than 50% was on counseling and review of test results     [facility], [place], MD 02/09/2014 9:42 AM

## 2014-02-09 NOTE — Assessment & Plan Note (Signed)
We discussed the importance of preventive care and reviewed the vaccination programs. He does not have any prior allergic reactions to influenza vaccination. He agrees to proceed with influenza vaccination today and we will administer it today at the clinic.  

## 2014-02-09 NOTE — Assessment & Plan Note (Signed)
He has severe bilateral hearing loss and vertigo. I recommend ENT consultation.

## 2014-02-09 NOTE — Assessment & Plan Note (Signed)
Clinically, he has no signs of recurrence. I will see him back again in 6 months with history, physical examination and blood work and would defer imaging studies unless he have anything to suggest disease recurrence.

## 2014-02-09 NOTE — Assessment & Plan Note (Signed)
He has vertigo and recurrent ear infection. I recommend ENT consultation and he agreed.

## 2014-06-23 ENCOUNTER — Telehealth: Payer: Self-pay | Admitting: Family Medicine

## 2014-06-23 NOTE — Telephone Encounter (Signed)
Pt given appt with Marline Backbone Friday at 9.

## 2014-06-25 ENCOUNTER — Ambulatory Visit (INDEPENDENT_AMBULATORY_CARE_PROVIDER_SITE_OTHER): Payer: Medicare Other | Admitting: Physician Assistant

## 2014-06-25 ENCOUNTER — Encounter: Payer: Self-pay | Admitting: Physician Assistant

## 2014-06-25 VITALS — BP 142/87 | HR 82 | Temp 97.8°F | Ht 69.0 in | Wt 144.6 lb

## 2014-06-25 DIAGNOSIS — R300 Dysuria: Secondary | ICD-10-CM | POA: Diagnosis not present

## 2014-06-25 DIAGNOSIS — Z8551 Personal history of malignant neoplasm of bladder: Secondary | ICD-10-CM | POA: Diagnosis not present

## 2014-06-25 LAB — POCT URINALYSIS DIPSTICK
Bilirubin, UA: NEGATIVE
Blood, UA: NEGATIVE
GLUCOSE UA: NEGATIVE
Leukocytes, UA: NEGATIVE
Nitrite, UA: NEGATIVE
Protein, UA: NEGATIVE
Spec Grav, UA: 1.02
Urobilinogen, UA: NEGATIVE
pH, UA: 6.5

## 2014-06-25 LAB — POCT UA - MICROSCOPIC ONLY
BACTERIA, U MICROSCOPIC: NEGATIVE
Casts, Ur, LPF, POC: NEGATIVE
Crystals, Ur, HPF, POC: NEGATIVE
RBC, urine, microscopic: NEGATIVE
WBC, Ur, HPF, POC: NEGATIVE
Yeast, UA: NEGATIVE

## 2014-06-25 MED ORDER — CIPROFLOXACIN HCL 500 MG PO TABS: 500.0000 mg | ORAL_TABLET | Freq: Two times a day (BID) | ORAL | Status: DC

## 2014-06-25 NOTE — Progress Notes (Signed)
   Subjective:    Patient ID: Kenneth Reed, male    DOB: 1945-06-13, 69 y.o.   MRN: [id number]  Dysuria  Associated symptoms include frequency and urgency. Pertinent negatives include no hematuria.   69 y/o male with h/o bladder CA in 2010 and prostatitis presents with c/o frequent urination, dysuria, x 2 weeks. Saw urologist in December with no problems and has an appt in     Review of Systems  Constitutional: Negative.   Genitourinary: Positive for dysuria, urgency, frequency, decreased urine volume and difficulty urinating. Negative for hematuria.       Objective:   Physical Exam  Constitutional: He is oriented to person, place, and time. He appears well-developed and well-nourished. No distress.  Abdominal: Soft. Bowel sounds are normal. He exhibits no distension and no mass. There is no tenderness. There is no rebound and no guarding.  Neurological: He is alert and oriented to person, place, and time.  Skin: He is not diaphoretic.          Assessment & Plan:  1. History of Bladder CA 2. Dysuria 3. Prostatitis  U/A negative for UTI so will refer to new Urologist for further testing d/t patient's history and his previous Urologist retiring. Urine culture order. Will empirically treat for cystitis and prostatis with Cipro 500 mg BID x 10 days. F/u in 2 weeks

## 2014-06-26 LAB — URINE CULTURE: Organism ID, Bacteria: NO GROWTH

## 2014-06-30 ENCOUNTER — Telehealth: Payer: Self-pay | Admitting: Physician Assistant

## 2014-07-01 NOTE — Telephone Encounter (Signed)
Spoke with daughter about urine and that a referral has been made.

## 2014-07-09 ENCOUNTER — Ambulatory Visit: Payer: Medicare Other | Admitting: Physician Assistant

## 2014-07-12 ENCOUNTER — Ambulatory Visit: Payer: Medicare Other | Admitting: Physician Assistant

## 2014-08-10 ENCOUNTER — Ambulatory Visit (HOSPITAL_BASED_OUTPATIENT_CLINIC_OR_DEPARTMENT_OTHER): Payer: Medicare Other | Admitting: Hematology and Oncology

## 2014-08-10 ENCOUNTER — Encounter: Payer: Self-pay | Admitting: Hematology and Oncology

## 2014-08-10 ENCOUNTER — Other Ambulatory Visit (HOSPITAL_BASED_OUTPATIENT_CLINIC_OR_DEPARTMENT_OTHER): Payer: Medicare Other

## 2014-08-10 ENCOUNTER — Telehealth: Payer: Self-pay | Admitting: Hematology and Oncology

## 2014-08-10 VITALS — BP 123/82 | HR 20 | Temp 97.9°F | Resp 98 | Ht 69.0 in | Wt 143.7 lb

## 2014-08-10 DIAGNOSIS — R5383 Other fatigue: Secondary | ICD-10-CM

## 2014-08-10 DIAGNOSIS — Z85038 Personal history of other malignant neoplasm of large intestine: Secondary | ICD-10-CM

## 2014-08-10 DIAGNOSIS — Z8572 Personal history of non-Hodgkin lymphomas: Secondary | ICD-10-CM | POA: Diagnosis not present

## 2014-08-10 DIAGNOSIS — Z8551 Personal history of malignant neoplasm of bladder: Secondary | ICD-10-CM

## 2014-08-10 DIAGNOSIS — C8599 Non-Hodgkin lymphoma, unspecified, extranodal and solid organ sites: Secondary | ICD-10-CM

## 2014-08-10 LAB — COMPREHENSIVE METABOLIC PANEL (CC13)
ALT: 12 U/L (ref 0–55)
AST: 17 U/L (ref 5–34)
Albumin: 3.9 g/dL (ref 3.5–5.0)
Alkaline Phosphatase: 52 U/L (ref 40–150)
Anion Gap: 9 mEq/L (ref 3–11)
BUN: 7.8 mg/dL (ref 7.0–26.0)
CALCIUM: 9 mg/dL (ref 8.4–10.4)
CO2: 24 mEq/L (ref 22–29)
Chloride: 106 mEq/L (ref 98–109)
Creatinine: 0.8 mg/dL (ref 0.7–1.3)
EGFR: 88 mL/min/{1.73_m2} — AB (ref >90)
GLUCOSE: 95 mg/dL (ref 70–140)
Potassium: 3.9 mEq/L (ref 3.5–5.1)
SODIUM: 139 meq/L (ref 136–145)
Total Bilirubin: 0.81 mg/dL (ref 0.20–1.20)
Total Protein: 6.7 g/dL (ref 6.4–8.3)

## 2014-08-10 LAB — CBC WITH DIFFERENTIAL/PLATELET
BASO%: 0.9 % (ref 0.0–2.0)
Basophils Absolute: 0 10*3/uL (ref 0.0–0.1)
EOS%: 3.7 % (ref 0.0–7.0)
Eosinophils Absolute: 0.1 10*3/uL (ref 0.0–0.5)
HEMATOCRIT: 42.8 % (ref 38.4–49.9)
HGB: 15.1 g/dL (ref 13.0–17.1)
LYMPH%: 23.6 % (ref 14.0–49.0)
MCH: 32.5 pg (ref 27.2–33.4)
MCHC: 35.3 g/dL (ref 32.0–36.0)
MCV: 92 fL (ref 79.3–98.0)
MONO#: 0.3 10*3/uL (ref 0.1–0.9)
MONO%: 7.1 % (ref 0.0–14.0)
NEUT%: 64.7 % (ref 39.0–75.0)
NEUTROS ABS: 2.3 10*3/uL (ref 1.5–6.5)
PLATELETS: 181 10*3/uL (ref 140–400)
RBC: 4.65 10*6/uL (ref 4.20–5.82)
RDW: 12.6 % (ref 11.0–14.6)
WBC: 3.5 10*3/uL — AB (ref 4.0–10.3)
lymph#: 0.8 10*3/uL — ABNORMAL LOW (ref 0.9–3.3)

## 2014-08-10 LAB — LACTATE DEHYDROGENASE (CC13): LDH: 135 U/L (ref 125–245)

## 2014-08-10 NOTE — Telephone Encounter (Signed)
per pof to sch pt appt-pt req to mail sch-mailed copy

## 2014-08-10 NOTE — Assessment & Plan Note (Signed)
Clinically, he has no signs of recurrence. I will see him back again in 1 year with history, physical examination and blood work and would defer imaging studies unless he have anything to suggest disease recurrence.

## 2014-08-10 NOTE — Assessment & Plan Note (Signed)
He denies recent hematuria. He will continue surveillance cystoscopy with urology department.

## 2014-08-10 NOTE — Assessment & Plan Note (Signed)
No change in bowel habits. Recent colonoscopy in June 2015 were negative.

## 2014-08-10 NOTE — Progress Notes (Signed)
[place] OFFICE PROGRESS NOTE  Patient Care Team: Sharion Balloon, [place] as PCP - General (Nurse Practitioner) Melida Quitter, MD as Attending Physician (Otolaryngology) Burke Keels, MD (General Surgery) Miguel Dibble, MD as Attending Physician (Urology)  SUMMARY OF ONCOLOGIC HISTORY:  #1 history of follicular lymphoma of the left tonsil, high grade, status post R. CHOP chemotherapy and radiation therapy #2 history of bladder cancer in 2010 and local recurrence in 2013, on observation #3 stage II colon cancer status post hemicolectomy in 2010 without adjuvant treatment, on observation  INTERVAL HISTORY: Please see below for problem oriented charting. He feels well. Denies new lymphadenopathy. Denies recent hematuria or hematochezia.  REVIEW OF SYSTEMS:   Constitutional: Denies fevers, chills or abnormal weight loss Eyes: Denies blurriness of vision Ears, nose, mouth, throat, and face: Denies mucositis or sore throat Respiratory: Denies cough, dyspnea or wheezes Cardiovascular: Denies palpitation, chest discomfort or lower extremity swelling Gastrointestinal:  Denies nausea, heartburn or change in bowel habits Skin: Denies abnormal skin rashes Lymphatics: Denies new lymphadenopathy or easy bruising Neurological:Denies numbness, tingling or new weaknesses Behavioral/Psych: Mood is stable, no new changes  All other systems were reviewed with the patient and are negative.  I have reviewed the past medical history, past surgical history, social history and family history with the patient and they are unchanged from previous note.  ALLERGIES:  has No Known Allergies.  MEDICATIONS:  Current Outpatient Prescriptions  Medication Sig Dispense Refill  . acetaminophen (TYLENOL) 500 MG tablet Take 500 mg by mouth every 6 (six) hours as needed (pain).    Marland Kitchen albuterol (PROVENTIL HFA;VENTOLIN HFA) 108 (90 BASE) MCG/ACT inhaler Inhale 2 puffs into the lungs every 4 (four) hours  as needed. For shortness of breath. 1 Inhaler 2  . cholecalciferol (VITAMIN D) 1000 UNITS tablet Take 1,000 Units by mouth daily.    . Fluticasone-Salmeterol (ADVAIR) 250-50 MCG/DOSE AEPB Inhale 2 puffs into the lungs every morning.     . Multiple Vitamin (MULTIVITAMIN WITH MINERALS) TABS tablet Take 1 tablet by mouth daily.    . tamsulosin (FLOMAX) 0.4 MG CAPS capsule Take 0.4 mg by mouth.     No current facility-administered medications for this visit.    PHYSICAL EXAMINATION: ECOG PERFORMANCE STATUS: 0 - Asymptomatic  Filed Vitals:   08/10/14 0905  BP: 123/82  Pulse: 20  Temp: 97.9 F (36.6 C)  Resp: 98   Filed Weights   08/10/14 0905  Weight: 143 lb 11.2 oz (65.182 kg)    GENERAL:alert, no distress and comfortable SKIN: skin color, texture, turgor are normal, no rashes or significant lesions EYES: normal, Conjunctiva are pink and non-injected, sclera clear OROPHARYNX:no exudate, no erythema and lips, buccal mucosa, and tongue normal  NECK: supple, thyroid normal size, non-tender, without nodularity LYMPH:  no palpable lymphadenopathy in the cervical, axillary or inguinal LUNGS: clear to auscultation and percussion with normal breathing effort HEART: regular rate & rhythm and no murmurs and no lower extremity edema ABDOMEN:abdomen soft, non-tender and normal bowel sounds Musculoskeletal:no cyanosis of digits and no clubbing  NEURO: alert & oriented x 3 with fluent speech, no focal motor/sensory deficits  LABORATORY DATA:  I have reviewed the data as listed    Component Value Date/Time   NA 139 08/10/2014 0856   NA 137 09/19/2013 1614   K 3.9 08/10/2014 0856   K 4.4 09/19/2013 1614   CL 98 09/19/2013 1614   CL 107 07/11/2012 0817   CO2 24 08/10/2014 0856   CO2  24 09/19/2013 1614   GLUCOSE 95 08/10/2014 0856   GLUCOSE 102* 09/19/2013 1614   GLUCOSE 96 07/11/2012 0817   BUN 7.8 08/10/2014 0856   BUN 8 09/19/2013 1614   CREATININE 0.8 08/10/2014 0856   CREATININE  0.78 09/19/2013 1614   CALCIUM 9.0 08/10/2014 0856   CALCIUM 9.3 09/19/2013 1614   PROT 6.7 08/10/2014 0856   PROT 7.5 09/19/2013 1614   ALBUMIN 3.9 08/10/2014 0856   ALBUMIN 3.9 09/19/2013 1614   AST 17 08/10/2014 0856   AST 21 09/19/2013 1614   ALT 12 08/10/2014 0856   ALT 13 09/19/2013 1614   ALKPHOS 52 08/10/2014 0856   ALKPHOS 71 09/19/2013 1614   BILITOT 0.81 08/10/2014 0856   BILITOT 1.1 09/19/2013 1614   GFRNONAA 89* 09/19/2013 1614   GFRAA >90 09/19/2013 1614    No results found for: SPEP, UPEP  Lab Results  Component Value Date   WBC 3.5* 08/10/2014   NEUTROABS 2.3 08/10/2014   HGB 15.1 08/10/2014   HCT 42.8 08/10/2014   MCV 92.0 08/10/2014   PLT 181 08/10/2014      Chemistry      Component Value Date/Time   NA 139 08/10/2014 0856   NA 137 09/19/2013 1614   K 3.9 08/10/2014 0856   K 4.4 09/19/2013 1614   CL 98 09/19/2013 1614   CL 107 07/11/2012 0817   CO2 24 08/10/2014 0856   CO2 24 09/19/2013 1614   BUN 7.8 08/10/2014 0856   BUN 8 09/19/2013 1614   CREATININE 0.8 08/10/2014 0856   CREATININE 0.78 09/19/2013 1614      Component Value Date/Time   CALCIUM 9.0 08/10/2014 0856   CALCIUM 9.3 09/19/2013 1614   ALKPHOS 52 08/10/2014 0856   ALKPHOS 71 09/19/2013 1614   AST 17 08/10/2014 0856   AST 21 09/19/2013 1614   ALT 12 08/10/2014 0856   ALT 13 09/19/2013 1614   BILITOT 0.81 08/10/2014 0856   BILITOT 1.1 09/19/2013 1614     ASSESSMENT & PLAN:  Non-Hodgkin's lymphoma of tonsil Clinically, he has no signs of recurrence. I will see him back again in 1 year with history, physical examination and blood work and would defer imaging studies unless he have anything to suggest disease recurrence.   History of colon cancer No change in bowel habits. Recent colonoscopy in June 2015 were negative.   History of bladder cancer He denies recent hematuria. He will continue surveillance cystoscopy with urology department.    Orders Placed This  Encounter  Procedures  . CBC with Differential/Platelet    Standing Status: Future     Number of Occurrences:      Standing Expiration Date: 09/14/2015  . Comprehensive metabolic panel    Standing Status: Future     Number of Occurrences:      Standing Expiration Date: 09/14/2015  . Lactate dehydrogenase    Standing Status: Future     Number of Occurrences:      Standing Expiration Date: 09/14/2015  . TSH    Standing Status: Future     Number of Occurrences:      Standing Expiration Date: 09/14/2015   All questions were answered. The patient knows to call the clinic with any problems, questions or concerns. No barriers to learning was detected. I spent 15 minutes counseling the patient face to face. The total time spent in the appointment was 20 minutes and more than 50% was on counseling and review of test results  [facility], [place], MD 08/10/2014 12:46 PM

## 2015-02-03 ENCOUNTER — Ambulatory Visit (INDEPENDENT_AMBULATORY_CARE_PROVIDER_SITE_OTHER): Payer: Medicare Other

## 2015-02-03 DIAGNOSIS — Z23 Encounter for immunization: Secondary | ICD-10-CM | POA: Diagnosis not present

## 2015-02-03 NOTE — Progress Notes (Signed)
Fluarix and prevnar 13 given pt tolerated well

## 2015-04-21 ENCOUNTER — Ambulatory Visit (INDEPENDENT_AMBULATORY_CARE_PROVIDER_SITE_OTHER): Payer: Medicare Other | Admitting: Family

## 2015-04-21 ENCOUNTER — Encounter: Payer: Self-pay | Admitting: Family

## 2015-04-21 VITALS — BP 117/76 | HR 89 | Temp 97.5°F | Ht 69.0 in | Wt 143.0 lb

## 2015-04-21 DIAGNOSIS — J449 Chronic obstructive pulmonary disease, unspecified: Secondary | ICD-10-CM | POA: Insufficient documentation

## 2015-04-21 DIAGNOSIS — Z1159 Encounter for screening for other viral diseases: Secondary | ICD-10-CM

## 2015-04-21 DIAGNOSIS — C8591 Non-Hodgkin lymphoma, unspecified, lymph nodes of head, face, and neck: Secondary | ICD-10-CM | POA: Diagnosis not present

## 2015-04-21 DIAGNOSIS — J42 Unspecified chronic bronchitis: Secondary | ICD-10-CM

## 2015-04-21 DIAGNOSIS — R5383 Other fatigue: Secondary | ICD-10-CM | POA: Diagnosis not present

## 2015-04-21 DIAGNOSIS — N400 Enlarged prostate without lower urinary tract symptoms: Secondary | ICD-10-CM

## 2015-04-21 DIAGNOSIS — E559 Vitamin D deficiency, unspecified: Secondary | ICD-10-CM

## 2015-04-21 DIAGNOSIS — R231 Pallor: Secondary | ICD-10-CM | POA: Diagnosis not present

## 2015-04-21 MED ORDER — ALBUTEROL SULFATE HFA 108 (90 BASE) MCG/ACT IN AERS: 2.0000 | INHALATION_SPRAY | RESPIRATORY_TRACT | Status: DC | PRN

## 2015-04-21 MED ORDER — FLUTICASONE-SALMETEROL 250-50 MCG/DOSE IN AEPB: 2.0000 | INHALATION_SPRAY | Freq: Every morning | RESPIRATORY_TRACT | Status: DC

## 2015-04-21 NOTE — Progress Notes (Signed)
Subjective:    Patient ID: Kenneth Reed, male    DOB: 04/24/1945, 70 y.o.   MRN: [id number]  Pt presents to the office today for chronic follow up. PT states he taks advair daily and using his albuterol "maybe once a week". However, pt states he has been out of his Advair for the last 5-6 months and has had to use his albuterol BID for COPD. Pt denies any smoking at this time. Pt states he is taking Flomax and Proscar for BPH and states this is greatly improved as long as he takes his Flomax. Pt has a Urologists that he goes to once a year for his BPH and history of Bladder Cancer. Pt also has an Oncologists he see's once a year for Non-Hodgkin lymphoma.  Benign Prostatic Hypertrophy This is a chronic problem. The current episode started more than 1 year ago. The problem is unchanged. Irritative symptoms include nocturia ("about 2 times"). Irritative symptoms do not include frequency. Pertinent negatives include no dysuria or hematuria. Past treatments include tamsulosin. The treatment provided significant relief.      Review of Systems  Constitutional: Negative.   HENT: Negative.   Respiratory: Negative.   Cardiovascular: Negative.   Gastrointestinal: Negative.   Endocrine: Negative.   Genitourinary: Positive for nocturia ("about 2 times"). Negative for dysuria, frequency and hematuria.  Musculoskeletal: Negative.   Neurological: Negative.   Hematological: Negative.   Psychiatric/Behavioral: Negative.   All other systems reviewed and are negative.      Objective:   Physical Exam  Constitutional: He is oriented to person, place, and time. He appears well-developed and well-nourished. No distress.  HENT:  Head: Normocephalic.  Right Ear: External ear normal.  Left Ear: External ear normal.  Mouth/Throat: Oropharynx is clear and moist.  Eyes: Pupils are equal, round, and reactive to light. Right eye exhibits no discharge. Left eye exhibits no discharge.  Neck: Normal range of  motion. Neck supple. No thyromegaly present.  Cardiovascular: Normal rate, regular rhythm, normal heart sounds and intact distal pulses.   No murmur heard. Pulmonary/Chest: Effort normal. No respiratory distress. He has wheezes.  Abdominal: Soft. Bowel sounds are normal. He exhibits no distension. There is no tenderness.  Musculoskeletal: Normal range of motion. He exhibits no edema or tenderness.  Neurological: He is alert and oriented to person, place, and time. He has normal reflexes. No cranial nerve deficit.  Skin: Skin is warm and dry. No rash noted. No erythema. There is pallor.  Psychiatric: He has a normal mood and affect. His behavior is normal. Judgment and thought content normal.  Vitals reviewed.   BP 117/76 mmHg  Pulse 89  Temp(Src) 97.5 F (36.4 C) (Oral)  Ht '5\' 9"'  (1.753 m)  Wt 143 lb (64.864 kg)  BMI 21.11 kg/m2       Assessment & Plan:  1. BPH (benign prostatic hyperplasia) - CMP14+EGFR  2. Chronic bronchitis, unspecified chronic bronchitis type (HCC) - CMP14+EGFR - Fluticasone-Salmeterol (ADVAIR) 250-50 MCG/DOSE AEPB; Inhale 2 puffs into the lungs every morning.  Dispense: 60 each; Refill: 6 - albuterol (PROVENTIL HFA;VENTOLIN HFA) 108 (90 Base) MCG/ACT inhaler; Inhale 2 puffs into the lungs every 4 (four) hours as needed. For shortness of breath.  Dispense: 1 Inhaler; Refill: 2  3. Vitamin D deficiency - CMP14+EGFR - VITAMIN D 25 Hydroxy (Vit-D Deficiency, Fractures)  4. Non-Hodgkin's lymphoma of head ([place]) - CMP14+EGFR  5. Other fatigue - CMP14+EGFR - albuterol (PROVENTIL HFA;VENTOLIN HFA) 108 (90 Base) MCG/ACT inhaler; Inhale  2 puffs into the lungs every 4 (four) hours as needed. For shortness of breath.  Dispense: 1 Inhaler; Refill: 2  6. Need for hepatitis C screening test - CMP14+EGFR - Hepatitis C antibody  7. Pale - CMP14+EGFR - Anemia Profile B   Continue all meds Labs pending Health Maintenance reviewed Diet and exercise  encouraged RTO 6 months  Evelina Dun, FNP

## 2015-04-21 NOTE — Patient Instructions (Signed)

## 2015-04-22 LAB — ANEMIA PROFILE B
Basophils Absolute: 0 10*3/uL (ref 0.0–0.2)
Basos: 1 %
EOS (ABSOLUTE): 0.2 10*3/uL (ref 0.0–0.4)
Eos: 6 %
FERRITIN: 213 ng/mL (ref 30–400)
Folate: >20 ng/mL (ref >3.0)
HEMATOCRIT: 42.5 % (ref 37.5–51.0)
HEMOGLOBIN: 15.2 g/dL (ref 12.6–17.7)
IMMATURE GRANS (ABS): 0 10*3/uL (ref 0.0–0.1)
Immature Granulocytes: 0 %
Iron Saturation: 31 % (ref 15–55)
Iron: 96 ug/dL (ref 38–169)
LYMPHS: 27 %
Lymphocytes Absolute: 1 10*3/uL (ref 0.7–3.1)
MCH: 32.4 pg (ref 26.6–33.0)
MCHC: 35.8 g/dL — AB (ref 31.5–35.7)
MCV: 91 fL (ref 79–97)
Monocytes Absolute: 0.3 10*3/uL (ref 0.1–0.9)
Monocytes: 8 %
Neutrophils Absolute: 2.2 10*3/uL (ref 1.4–7.0)
Neutrophils: 58 %
Platelets: 204 10*3/uL (ref 150–379)
RBC: 4.69 x10E6/uL (ref 4.14–5.80)
RDW: 12.7 % (ref 12.3–15.4)
RETIC CT PCT: 1 % (ref 0.6–2.6)
Total Iron Binding Capacity: 309 ug/dL (ref 250–450)
UIBC: 213 ug/dL (ref 111–343)
Vitamin B-12: 729 pg/mL (ref 211–946)
WBC: 3.7 10*3/uL (ref 3.4–10.8)

## 2015-04-22 LAB — CMP14+EGFR
ALBUMIN: 4.4 g/dL (ref 3.5–4.8)
ALK PHOS: 60 IU/L (ref 39–117)
ALT: 10 IU/L (ref 0–44)
AST: 17 IU/L (ref 0–40)
Albumin/Globulin Ratio: 1.8 (ref 1.1–2.5)
BUN / CREAT RATIO: 14 (ref 10–22)
BUN: 14 mg/dL (ref 8–27)
Bilirubin Total: 0.8 mg/dL (ref 0.0–1.2)
CALCIUM: 9.7 mg/dL (ref 8.6–10.2)
CO2: 23 mmol/L (ref 18–29)
CREATININE: 0.97 mg/dL (ref 0.76–1.27)
Chloride: 100 mmol/L (ref 96–106)
GFR calc Af Amer: 90 mL/min/{1.73_m2} (ref >59)
GFR, EST NON AFRICAN AMERICAN: 78 mL/min/{1.73_m2} (ref >59)
GLUCOSE: 87 mg/dL (ref 65–99)
Globulin, Total: 2.4 g/dL (ref 1.5–4.5)
Potassium: 4.6 mmol/L (ref 3.5–5.2)
Sodium: 140 mmol/L (ref 134–144)
Total Protein: 6.8 g/dL (ref 6.0–8.5)

## 2015-04-22 LAB — VITAMIN D 25 HYDROXY (VIT D DEFICIENCY, FRACTURES): VIT D 25 HYDROXY: 30.4 ng/mL (ref 30.0–100.0)

## 2015-04-22 LAB — HEPATITIS C ANTIBODY: Hep C Virus Ab: <0.1 s/co ratio (ref 0.0–0.9)

## 2015-04-22 NOTE — Progress Notes (Signed)
Patient aware.

## 2015-06-01 ENCOUNTER — Ambulatory Visit: Payer: Medicare Other | Admitting: Family Medicine

## 2015-06-23 ENCOUNTER — Telehealth: Payer: Self-pay | Admitting: Hematology and Oncology

## 2015-06-23 NOTE — Telephone Encounter (Signed)
S.W. PT AND ADVISED ON 5.2 APPT MOVED TO 5.1.Marland KitchenMarland KitchenPT OK AND AWARE

## 2015-07-25 ENCOUNTER — Other Ambulatory Visit: Payer: Self-pay | Admitting: Hematology and Oncology

## 2015-07-25 ENCOUNTER — Telehealth: Payer: Self-pay | Admitting: *Deleted

## 2015-07-25 NOTE — Telephone Encounter (Signed)
VM message received from patient@ [phone] am stating he wants to see "Triage MD". He has an appt to see Dr. Alvy Bimler on May 1st but states he is sick now and wants to be seen. He states he is "sick, weak and nauseous".  Attempted to call back at designated # . No answer. Left VM for patient to return call.

## 2015-07-25 NOTE — Telephone Encounter (Signed)
Informed pt Dr. Alvy Bimler can see him tomorrow.  He agreed and will be here a little before noon for lab and to see Dr. Alvy Bimler at 12:30 pm.  Urgent POF sent.

## 2015-07-25 NOTE — Telephone Encounter (Signed)
The patient is not getting any Rx from [place]. He has not been seen over 1 year I can see him tomorrow at 1230 pm with labs at 12 pm Otherwise, he can see his PCP

## 2015-07-26 ENCOUNTER — Encounter: Payer: Self-pay | Admitting: Hematology and Oncology

## 2015-07-26 ENCOUNTER — Ambulatory Visit (HOSPITAL_BASED_OUTPATIENT_CLINIC_OR_DEPARTMENT_OTHER): Payer: Medicare Other | Admitting: Hematology and Oncology

## 2015-07-26 ENCOUNTER — Other Ambulatory Visit (HOSPITAL_BASED_OUTPATIENT_CLINIC_OR_DEPARTMENT_OTHER): Payer: Medicare Other

## 2015-07-26 ENCOUNTER — Ambulatory Visit (HOSPITAL_BASED_OUTPATIENT_CLINIC_OR_DEPARTMENT_OTHER): Payer: Medicare Other

## 2015-07-26 ENCOUNTER — Telehealth: Payer: Self-pay | Admitting: Hematology and Oncology

## 2015-07-26 VITALS — BP 135/72 | HR 84 | Temp 98.2°F | Resp 18 | Ht 69.0 in | Wt 140.6 lb

## 2015-07-26 VITALS — BP 131/71 | HR 67

## 2015-07-26 DIAGNOSIS — R110 Nausea: Secondary | ICD-10-CM

## 2015-07-26 DIAGNOSIS — C8591 Non-Hodgkin lymphoma, unspecified, lymph nodes of head, face, and neck: Secondary | ICD-10-CM

## 2015-07-26 DIAGNOSIS — Z8551 Personal history of malignant neoplasm of bladder: Secondary | ICD-10-CM

## 2015-07-26 DIAGNOSIS — R634 Abnormal weight loss: Secondary | ICD-10-CM

## 2015-07-26 DIAGNOSIS — C8599 Non-Hodgkin lymphoma, unspecified, extranodal and solid organ sites: Secondary | ICD-10-CM

## 2015-07-26 DIAGNOSIS — R5383 Other fatigue: Secondary | ICD-10-CM

## 2015-07-26 DIAGNOSIS — Z85038 Personal history of other malignant neoplasm of large intestine: Secondary | ICD-10-CM

## 2015-07-26 DIAGNOSIS — R42 Dizziness and giddiness: Secondary | ICD-10-CM

## 2015-07-26 LAB — COMPREHENSIVE METABOLIC PANEL
ALBUMIN: 4.2 g/dL (ref 3.5–5.0)
ALT: 9 U/L (ref 0–55)
ANION GAP: 8 meq/L (ref 3–11)
AST: 17 U/L (ref 5–34)
Alkaline Phosphatase: 61 U/L (ref 40–150)
BUN: 8 mg/dL (ref 7.0–26.0)
CALCIUM: 10.2 mg/dL (ref 8.4–10.4)
CHLORIDE: 106 meq/L (ref 98–109)
CO2: 27 meq/L (ref 22–29)
Creatinine: 1.1 mg/dL (ref 0.7–1.3)
EGFR: 67 mL/min/{1.73_m2} — ABNORMAL LOW (ref >90)
GLUCOSE: 107 mg/dL (ref 70–140)
Potassium: 4.1 mEq/L (ref 3.5–5.1)
Sodium: 140 mEq/L (ref 136–145)
TOTAL PROTEIN: 7.3 g/dL (ref 6.4–8.3)
Total Bilirubin: 0.86 mg/dL (ref 0.20–1.20)

## 2015-07-26 LAB — CBC WITH DIFFERENTIAL/PLATELET
BASO%: 0.9 % (ref 0.0–2.0)
Basophils Absolute: 0 10*3/uL (ref 0.0–0.1)
EOS ABS: 0 10*3/uL (ref 0.0–0.5)
EOS%: 0.1 % (ref 0.0–7.0)
HEMATOCRIT: 45 % (ref 38.4–49.9)
HGB: 15.7 g/dL (ref 13.0–17.1)
LYMPH#: 0.8 10*3/uL — AB (ref 0.9–3.3)
LYMPH%: 15.7 % (ref 14.0–49.0)
MCH: 31.7 pg (ref 27.2–33.4)
MCHC: 34.8 g/dL (ref 32.0–36.0)
MCV: 91.2 fL (ref 79.3–98.0)
MONO#: 0.4 10*3/uL (ref 0.1–0.9)
MONO%: 7.1 % (ref 0.0–14.0)
NEUT%: 76.2 % — ABNORMAL HIGH (ref 39.0–75.0)
NEUTROS ABS: 3.8 10*3/uL (ref 1.5–6.5)
PLATELETS: 242 10*3/uL (ref 140–400)
RBC: 4.94 10*6/uL (ref 4.20–5.82)
RDW: 12.6 % (ref 11.0–14.6)
WBC: 5 10*3/uL (ref 4.0–10.3)

## 2015-07-26 LAB — TSH: TSH: 2.33 m[IU]/L (ref 0.320–4.118)

## 2015-07-26 LAB — LACTATE DEHYDROGENASE: LDH: 120 U/L — ABNORMAL LOW (ref 125–245)

## 2015-07-26 MED ORDER — ONDANSETRON HCL 8 MG PO TABS: 8.0000 mg | ORAL_TABLET | Freq: Three times a day (TID) | ORAL | Status: DC | PRN

## 2015-07-26 MED ORDER — SODIUM CHLORIDE 0.9 % IV SOLN
Freq: Once | INTRAVENOUS | Status: AC
  Administered 2015-07-26: 13:00:00 via INTRAVENOUS

## 2015-07-26 MED ORDER — SODIUM CHLORIDE 0.9 % IV SOLN
Freq: Once | INTRAVENOUS | Status: AC
  Administered 2015-07-26: 13:00:00 via INTRAVENOUS
  Filled 2015-07-26: qty 4

## 2015-07-26 NOTE — Progress Notes (Signed)
Kenneth Reed OFFICE PROGRESS NOTE  Patient Care Team: Sharion Balloon, [place] as PCP - General (Nurse Practitioner) Melida Quitter, MD as Attending Physician (Otolaryngology) Burke Keels, MD (General Surgery) Miguel Dibble, MD as Attending Physician (Urology)  SUMMARY OF ONCOLOGIC HISTORY:  #1 history of follicular lymphoma of the left tonsil, high grade, status post R. [facility] chemotherapy and radiation therapy #2 history of bladder cancer in 2010 and local recurrence in 2013, on observation #3 stage II colon cancer status post hemicolectomy in 2010 without adjuvant treatment, on observation  INTERVAL HISTORY: Please see below for problem oriented charting. He is seen urgently today because of new onset of nausea and vomiting for the past few days. He also has mild change in bowel habits. He has mild anorexia and 5 pound weight loss. He denies swallowing difficulties. The patient denies any recent signs or symptoms of bleeding such as spontaneous epistaxis, hematuria or hematochezia. He is concerned about cancer recurrence and hence is seen here today  REVIEW OF SYSTEMS:   Constitutional: Denies fevers, chills  Eyes: Denies blurriness of vision Ears, nose, mouth, throat, and face: Denies mucositis or sore throat Respiratory: Denies cough, dyspnea or wheezes Cardiovascular: Denies palpitation, chest discomfort or lower extremity swelling Skin: Denies abnormal skin rashes Lymphatics: Denies new lymphadenopathy or easy bruising Neurological:Denies numbness, tingling or new weaknesses Behavioral/Psych: Mood is stable, no new changes  All other systems were reviewed with the patient and are negative.  I have reviewed the past medical history, past surgical history, social history and family history with the patient and they are unchanged from previous note.  ALLERGIES:  has No Known Allergies.  MEDICATIONS:  Current Outpatient Prescriptions  Medication Sig Dispense Refill   . acetaminophen (TYLENOL) 500 MG tablet Take 500 mg by mouth every 6 (six) hours as needed (pain).    Marland Kitchen albuterol (PROVENTIL HFA;VENTOLIN HFA) 108 (90 Base) MCG/ACT inhaler Inhale 2 puffs into the lungs every 4 (four) hours as needed. For shortness of breath. 1 Inhaler 2  . finasteride (PROSCAR) 5 MG tablet Take 5 mg by mouth daily.    . Fluticasone-Salmeterol (ADVAIR) 250-50 MCG/DOSE AEPB Inhale 2 puffs into the lungs every morning. 60 each 6  . tamsulosin (FLOMAX) 0.4 MG CAPS capsule Take 0.4 mg by mouth.    . ondansetron (ZOFRAN) 8 MG tablet Take 1 tablet (8 mg total) by mouth every 8 (eight) hours as needed for nausea. 30 tablet 3   No current facility-administered medications for this visit.    PHYSICAL EXAMINATION: ECOG PERFORMANCE STATUS: 1 - Symptomatic but completely ambulatory  Filed Vitals:   07/26/15 1143  BP: 135/72  Pulse: 84  Temp: 98.2 F (36.8 C)  Resp: 18   Filed Weights   07/26/15 1143  Weight: 140 lb 9.6 oz (63.776 kg)    GENERAL:alert, no distress and comfortable SKIN: skin color, texture, turgor are normal, no rashes or significant lesions EYES: normal, Conjunctiva are pink and non-injected, sclera clear OROPHARYNX:no exudate, no erythema and lips, buccal mucosa, and tongue normal  NECK: supple, thyroid normal size, non-tender, without nodularity LYMPH:  no palpable lymphadenopathy in the cervical, axillary or inguinal LUNGS: clear to auscultation and percussion with normal breathing effort HEART: regular rate & rhythm and no murmurs and no lower extremity edema ABDOMEN:abdomen soft, non-tender and normal bowel sounds Musculoskeletal:no cyanosis of digits and no clubbing  NEURO: alert & oriented x 3 with fluent speech, no focal motor/sensory deficits  LABORATORY DATA:  I have reviewed  the data as listed    Component Value Date/Time   NA 140 07/26/2015 1128   NA 140 04/21/2015 1042   NA 137 09/19/2013 1614   K 4.1 07/26/2015 1128   K 4.6 04/21/2015  1042   CL 100 04/21/2015 1042   CL 107 07/11/2012 0817   CO2 27 07/26/2015 1128   CO2 23 04/21/2015 1042   GLUCOSE 107 07/26/2015 1128   GLUCOSE 87 04/21/2015 1042   GLUCOSE 102* 09/19/2013 1614   GLUCOSE 96 07/11/2012 0817   BUN 8.0 07/26/2015 1128   BUN 14 04/21/2015 1042   BUN 8 09/19/2013 1614   CREATININE 1.1 07/26/2015 1128   CREATININE 0.97 04/21/2015 1042   CALCIUM 10.2 07/26/2015 1128   CALCIUM 9.7 04/21/2015 1042   PROT 7.3 07/26/2015 1128   PROT 6.8 04/21/2015 1042   PROT 7.5 09/19/2013 1614   ALBUMIN 4.2 07/26/2015 1128   ALBUMIN 4.4 04/21/2015 1042   ALBUMIN 3.9 09/19/2013 1614   AST 17 07/26/2015 1128   AST 17 04/21/2015 1042   ALT 9 07/26/2015 1128   ALT 10 04/21/2015 1042   ALKPHOS 61 07/26/2015 1128   ALKPHOS 60 04/21/2015 1042   BILITOT 0.86 07/26/2015 1128   BILITOT 0.8 04/21/2015 1042   BILITOT 1.1 09/19/2013 1614   GFRNONAA 78 04/21/2015 1042   GFRAA 90 04/21/2015 1042    No results found for: SPEP, UPEP  Lab Results  Component Value Date   WBC 5.0 07/26/2015   NEUTROABS 3.8 07/26/2015   HGB 15.7 07/26/2015   HCT 45.0 07/26/2015   MCV 91.2 07/26/2015   PLT 242 07/26/2015      Chemistry      Component Value Date/Time   NA 140 07/26/2015 1128   NA 140 04/21/2015 1042   NA 137 09/19/2013 1614   K 4.1 07/26/2015 1128   K 4.6 04/21/2015 1042   CL 100 04/21/2015 1042   CL 107 07/11/2012 0817   CO2 27 07/26/2015 1128   CO2 23 04/21/2015 1042   BUN 8.0 07/26/2015 1128   BUN 14 04/21/2015 1042   BUN 8 09/19/2013 1614   CREATININE 1.1 07/26/2015 1128   CREATININE 0.97 04/21/2015 1042      Component Value Date/Time   CALCIUM 10.2 07/26/2015 1128   CALCIUM 9.7 04/21/2015 1042   ALKPHOS 61 07/26/2015 1128   ALKPHOS 60 04/21/2015 1042   AST 17 07/26/2015 1128   AST 17 04/21/2015 1042   ALT 9 07/26/2015 1128   ALT 10 04/21/2015 1042   BILITOT 0.86 07/26/2015 1128   BILITOT 0.8 04/21/2015 1042   BILITOT 1.1 09/19/2013 1614       ASSESSMENT & PLAN:  Non-Hodgkin's lymphoma of tonsil His examination is benign today but due to history of multiple cancer, he is at risk. I will order a CT scan of the neck for further evaluation.  History of colon cancer He has remote history of colon cancer and bladder cancer. He had new onset of sensation of fullness in the right axilla, nausea, vomiting and 5 pound weight loss. He also had mild change in bowel habits. I will order a CT scan of the chest, abdomen and pelvis for further management.  Chronic nausea He had new onset of nausea & vomiting for the past few days. I gave him prescription Zofran to take. He complained of mild dizziness and I recommend IV fluids and IV antiemetics today. As above, I plan to see him back next week for further assessment  and imaging studies.   Orders Placed This Encounter  Procedures  . CT Chest W Contrast    Standing Status: Future     Number of Occurrences:      Standing Expiration Date: 09/24/2016    Order Specific Question:  Reason for Exam (SYMPTOM  OR DIAGNOSIS REQUIRED)    Answer:  hx mulitple cancers, weight loss, nausea, exclude cancer reucrrence    Order Specific Question:  Preferred imaging location?    Answer:  [facility]  . CT Abdomen Pelvis W Contrast    Standing Status: Future     Number of Occurrences:      Standing Expiration Date: 10/25/2016    Order Specific Question:  Reason for Exam (SYMPTOM  OR DIAGNOSIS REQUIRED)    Answer:  hx mulitple cancers, weight loss, nausea, exclude cancer reucrrence    Order Specific Question:  Preferred imaging location?    Answer:  [facility]  . CT Soft Tissue Neck W Contrast    Standing Status: Future     Number of Occurrences:      Standing Expiration Date: 10/25/2016    Order Specific Question:  Reason for Exam (SYMPTOM  OR DIAGNOSIS REQUIRED)    Answer:  hx mulitple cancers, weight loss, nausea, exclude cancer reucrrence    Order Specific Question:   Preferred imaging location?    Answer:  [facility]   All questions were answered. The patient knows to call the clinic with any problems, questions or concerns. No barriers to learning was detected. I spent 25 minutes counseling the patient face to face. The total time spent in the appointment was 30 minutes and more than 50% was on counseling and review of test results     [facility], [place], MD 07/26/2015 3:18 PM

## 2015-07-26 NOTE — Patient Instructions (Signed)

## 2015-07-26 NOTE — Assessment & Plan Note (Signed)
He has remote history of colon cancer and bladder cancer. He had new onset of sensation of fullness in the right axilla, nausea, vomiting and 5 pound weight loss. He also had mild change in bowel habits. I will order a CT scan of the chest, abdomen and pelvis for further management.

## 2015-07-26 NOTE — Assessment & Plan Note (Signed)
He had new onset of nausea & vomiting for the past few days. I gave him prescription Zofran to take. He complained of mild dizziness and I recommend IV fluids and IV antiemetics today. As above, I plan to see him back next week for further assessment and imaging studies.

## 2015-07-26 NOTE — Assessment & Plan Note (Signed)
His examination is benign today but due to history of multiple cancer, he is at risk. I will order a CT scan of the neck for further evaluation.

## 2015-07-26 NOTE — Telephone Encounter (Signed)
Gave and printed appt sched and avs fo rpt for April...gv barium °

## 2015-07-27 ENCOUNTER — Telehealth: Payer: Self-pay | Admitting: *Deleted

## 2015-07-27 MED ORDER — PROCHLORPERAZINE MALEATE 10 MG PO TABS: 10.0000 mg | ORAL_TABLET | Freq: Four times a day (QID) | ORAL | Status: DC | PRN

## 2015-07-27 NOTE — Telephone Encounter (Signed)
Called [facility] to check on status of Compazine.  They said it does not need PA and ready for pt to pick up for 80 cents.  Informed pt of Compazine ordered for nausea every 6 hrs as needed.  Cautioned him about side effect sedation.  Pt verbalized understanding.

## 2015-07-27 NOTE — Telephone Encounter (Signed)
Pt left VM states insurance will not cover Zofran unless he is on chemotherapy.   I called [employer] and they state Zofran requires Prior Auth.  (It would not require prior auth if pt were on chemotherapy).  They faxed PA form and it was forwarded to Raquel in managed care dept.Marland Kitchen   Unsure if it will be approved and how long it will take.  Dr. Alvy Bimler gave order for Compazine to use for nausea now.

## 2015-07-28 ENCOUNTER — Telehealth: Payer: Self-pay | Admitting: *Deleted

## 2015-07-28 ENCOUNTER — Encounter: Payer: Self-pay | Admitting: Hematology and Oncology

## 2015-07-28 NOTE — Telephone Encounter (Signed)
Kenneth Reed with [name] Authorization called requesting more information.  Asked if he is on chemotherapy with chemo induced nausea and vomiting.  If radiation induced or surgically induced.  Patient negative for all the above.  This nurse did inform her patient is dehydrated with N/V x several days with weight loss.  History of multiple cancers (NHL to tonsils, colon and bladder) and will be imaged to restage soon.  No further questions.  Will share this information with the clinical team.

## 2015-07-28 NOTE — Progress Notes (Signed)
sent [employer] prior auth req for zofran-covermymeds

## 2015-08-01 ENCOUNTER — Ambulatory Visit (HOSPITAL_COMMUNITY): Payer: Medicare Other

## 2015-08-03 ENCOUNTER — Encounter (HOSPITAL_COMMUNITY): Payer: Self-pay

## 2015-08-03 ENCOUNTER — Other Ambulatory Visit: Payer: Self-pay | Admitting: Hematology and Oncology

## 2015-08-03 ENCOUNTER — Encounter: Payer: Self-pay | Admitting: Hematology and Oncology

## 2015-08-03 ENCOUNTER — Telehealth: Payer: Self-pay | Admitting: Hematology and Oncology

## 2015-08-03 ENCOUNTER — Ambulatory Visit (HOSPITAL_COMMUNITY)
Admission: RE | Admit: 2015-08-03 | Discharge: 2015-08-03 | Disposition: A | Discharge status: Home / Self Care | Payer: Medicare Other | Source: Ambulatory Visit | Attending: Hematology and Oncology | Admitting: Hematology and Oncology

## 2015-08-03 ENCOUNTER — Ambulatory Visit (HOSPITAL_BASED_OUTPATIENT_CLINIC_OR_DEPARTMENT_OTHER): Payer: Medicare Other | Admitting: Hematology and Oncology

## 2015-08-03 VITALS — BP 128/77 | HR 72 | Temp 97.6°F | Resp 18 | Ht 69.0 in | Wt 143.5 lb

## 2015-08-03 DIAGNOSIS — R911 Solitary pulmonary nodule: Secondary | ICD-10-CM | POA: Diagnosis not present

## 2015-08-03 DIAGNOSIS — Z8551 Personal history of malignant neoplasm of bladder: Secondary | ICD-10-CM

## 2015-08-03 DIAGNOSIS — Z85038 Personal history of other malignant neoplasm of large intestine: Secondary | ICD-10-CM

## 2015-08-03 DIAGNOSIS — C8591 Non-Hodgkin lymphoma, unspecified, lymph nodes of head, face, and neck: Secondary | ICD-10-CM | POA: Insufficient documentation

## 2015-08-03 DIAGNOSIS — D71 Functional disorders of polymorphonuclear neutrophils: Secondary | ICD-10-CM | POA: Diagnosis not present

## 2015-08-03 DIAGNOSIS — C8599 Non-Hodgkin lymphoma, unspecified, extranodal and solid organ sites: Secondary | ICD-10-CM

## 2015-08-03 DIAGNOSIS — R918 Other nonspecific abnormal finding of lung field: Secondary | ICD-10-CM | POA: Diagnosis not present

## 2015-08-03 DIAGNOSIS — N281 Cyst of kidney, acquired: Secondary | ICD-10-CM | POA: Diagnosis not present

## 2015-08-03 DIAGNOSIS — Z9049 Acquired absence of other specified parts of digestive tract: Secondary | ICD-10-CM | POA: Diagnosis not present

## 2015-08-03 MED ORDER — IOPAMIDOL (ISOVUE-300) INJECTION 61%
100.0000 mL | Freq: Once | INTRAVENOUS | Status: AC | PRN
  Administered 2015-08-03: 100 mL via INTRAVENOUS

## 2015-08-03 NOTE — Assessment & Plan Note (Signed)
The patient is asymptomatic. Per radiologist recommendation, I will order follow-up CT in one year

## 2015-08-03 NOTE — Assessment & Plan Note (Signed)
CT scan showed no evidence of disease. I will see him back year from now with history, physical examination and blood work 

## 2015-08-03 NOTE — Assessment & Plan Note (Signed)
CT scan showed no evidence of cancer. He had recent nausea and weight loss. His symptoms have improved since anti-emetics and IV fluid last week I recommend follow-up appointment with GI

## 2015-08-03 NOTE — Assessment & Plan Note (Addendum)
The patient has history of bladder cancer. I recommend further urology evaluation. He has appointment pending next month CT scan showed nonspecific wall thickening

## 2015-08-03 NOTE — Telephone Encounter (Signed)
spoke w/ pt confirmed appt for next yr

## 2015-08-03 NOTE — Progress Notes (Signed)
[place] OFFICE PROGRESS NOTE  Patient Care Team: Sharion Balloon, FNP as PCP - General (Nurse Practitioner) Melida Quitter, MD as Attending Physician (Otolaryngology) Miguel Dibble, MD as Attending Physician (Urology) Sadie Haber, MD as Referring Physician (Urology) Burke Keels, MD as Attending Physician (Surgery)  SUMMARY OF ONCOLOGIC HISTORY:  #1 history of follicular lymphoma of the left tonsil, high grade, status post R. [facility] chemotherapy and radiation therapy #2 history of bladder cancer in 2010 and local recurrence in 2013, on observation #3 stage II colon cancer status post hemicolectomy in 2010 without adjuvant treatment, on observation. Last colonoscopy was in 2015  INTERVAL HISTORY: Please see below for problem oriented charting. He returns for further follow-up. His nausea has resolved. He felt better since I saw him last week. REVIEW OF SYSTEMS:   Constitutional: Denies fevers, chills or abnormal weight loss Eyes: Denies blurriness of vision Ears, nose, mouth, throat, and face: Denies mucositis or sore throat Respiratory: Denies cough, dyspnea or wheezes Cardiovascular: Denies palpitation, chest discomfort or lower extremity swelling Gastrointestinal:  Denies nausea, heartburn or change in bowel habits Skin: Denies abnormal skin rashes Lymphatics: Denies new lymphadenopathy or easy bruising Neurological:Denies numbness, tingling or new weaknesses Behavioral/Psych: Mood is stable, no new changes  All other systems were reviewed with the patient and are negative.  I have reviewed the past medical history, past surgical history, social history and family history with the patient and they are unchanged from previous note.  ALLERGIES:  has No Known Allergies.  MEDICATIONS:  Current Outpatient Prescriptions  Medication Sig Dispense Refill  . acetaminophen (TYLENOL) 500 MG tablet Take 500 mg by mouth every 6 (six) hours as needed (pain).    .  finasteride (PROSCAR) 5 MG tablet Take 5 mg by mouth daily.    . Fluticasone-Salmeterol (ADVAIR) 250-50 MCG/DOSE AEPB Inhale 2 puffs into the lungs every morning. 60 each 6  . tamsulosin (FLOMAX) 0.4 MG CAPS capsule Take 0.4 mg by mouth.    Marland Kitchen albuterol (PROVENTIL HFA;VENTOLIN HFA) 108 (90 Base) MCG/ACT inhaler Inhale 2 puffs into the lungs every 4 (four) hours as needed. For shortness of breath. (Patient not taking: Reported on 08/03/2015) 1 Inhaler 2  . ondansetron (ZOFRAN) 8 MG tablet Take 1 tablet (8 mg total) by mouth every 8 (eight) hours as needed for nausea. (Patient not taking: Reported on 08/03/2015) 30 tablet 3  . prochlorperazine (COMPAZINE) 10 MG tablet Take 1 tablet (10 mg total) by mouth every 6 (six) hours as needed for nausea or vomiting. (Patient not taking: Reported on 08/03/2015) 30 tablet 1   No current facility-administered medications for this visit.    PHYSICAL EXAMINATION: ECOG PERFORMANCE STATUS: 0 - Asymptomatic  Filed Vitals:   08/03/15 0835  BP: 128/77  Pulse: 72  Temp: 97.6 F (36.4 C)  Resp: 18   Filed Weights   08/03/15 0835  Weight: 143 lb 8 oz (65.091 kg)    GENERAL:alert, no distress and comfortable SKIN: skin color, texture, turgor are normal, no rashes or significant lesions EYES: normal, Conjunctiva are pink and non-injected, sclera clear Musculoskeletal:no cyanosis of digits and no clubbing  NEURO: alert & oriented x 3 with fluent speech, no focal motor/sensory deficits  LABORATORY DATA:  I have reviewed the data as listed    Component Value Date/Time   NA 140 07/26/2015 1128   NA 140 04/21/2015 1042   NA 137 09/19/2013 1614   K 4.1 07/26/2015 1128   K 4.6 04/21/2015 1042  CL 100 04/21/2015 1042   CL 107 07/11/2012 0817   CO2 27 07/26/2015 1128   CO2 23 04/21/2015 1042   GLUCOSE 107 07/26/2015 1128   GLUCOSE 87 04/21/2015 1042   GLUCOSE 102* 09/19/2013 1614   GLUCOSE 96 07/11/2012 0817   BUN 8.0 07/26/2015 1128   BUN 14 04/21/2015  1042   BUN 8 09/19/2013 1614   CREATININE 1.1 07/26/2015 1128   CREATININE 0.97 04/21/2015 1042   CALCIUM 10.2 07/26/2015 1128   CALCIUM 9.7 04/21/2015 1042   PROT 7.3 07/26/2015 1128   PROT 6.8 04/21/2015 1042   PROT 7.5 09/19/2013 1614   ALBUMIN 4.2 07/26/2015 1128   ALBUMIN 4.4 04/21/2015 1042   ALBUMIN 3.9 09/19/2013 1614   AST 17 07/26/2015 1128   AST 17 04/21/2015 1042   ALT 9 07/26/2015 1128   ALT 10 04/21/2015 1042   ALKPHOS 61 07/26/2015 1128   ALKPHOS 60 04/21/2015 1042   BILITOT 0.86 07/26/2015 1128   BILITOT 0.8 04/21/2015 1042   BILITOT 1.1 09/19/2013 1614   GFRNONAA 78 04/21/2015 1042   GFRAA 90 04/21/2015 1042    No results found for: SPEP, UPEP  Lab Results  Component Value Date   WBC 5.0 07/26/2015   NEUTROABS 3.8 07/26/2015   HGB 15.7 07/26/2015   HCT 45.0 07/26/2015   MCV 91.2 07/26/2015   PLT 242 07/26/2015      Chemistry      Component Value Date/Time   NA 140 07/26/2015 1128   NA 140 04/21/2015 1042   NA 137 09/19/2013 1614   K 4.1 07/26/2015 1128   K 4.6 04/21/2015 1042   CL 100 04/21/2015 1042   CL 107 07/11/2012 0817   CO2 27 07/26/2015 1128   CO2 23 04/21/2015 1042   BUN 8.0 07/26/2015 1128   BUN 14 04/21/2015 1042   BUN 8 09/19/2013 1614   CREATININE 1.1 07/26/2015 1128   CREATININE 0.97 04/21/2015 1042      Component Value Date/Time   CALCIUM 10.2 07/26/2015 1128   CALCIUM 9.7 04/21/2015 1042   ALKPHOS 61 07/26/2015 1128   ALKPHOS 60 04/21/2015 1042   AST 17 07/26/2015 1128   AST 17 04/21/2015 1042   ALT 9 07/26/2015 1128   ALT 10 04/21/2015 1042   BILITOT 0.86 07/26/2015 1128   BILITOT 0.8 04/21/2015 1042   BILITOT 1.1 09/19/2013 1614       RADIOGRAPHIC STUDIES: I have personally reviewed the radiological images as listed and agreed with the findings in the report. Ct Soft Tissue Neck W Contrast  08/03/2015  CLINICAL DATA:  Non Hodgkin lymphoma of tonsil. History colon cancer. Weight loss. EXAM: CT NECK WITH  CONTRAST TECHNIQUE: Multidetector CT imaging of the neck was performed using the standard protocol following the bolus administration of intravenous contrast. CONTRAST:  142mL ISOVUE-300 IOPAMIDOL (ISOVUE-300) INJECTION 61% COMPARISON:  CT neck 08/03/2013 FINDINGS: Pharynx and larynx: The nasopharynx is normal. Tongue and oral pharynx are normal. Larynx normal. Salivary glands: Atrophic changes in the submandibular gland bilaterally due to radiation. This has progressed. Parotid gland reveals no mass or edema. Thyroid: Negative Lymph nodes: Negative for adenopathy in the neck. Sebaceous cyst in the right posterior subcutaneous tissues unchanged. Vascular: Carotid artery and jugular vein patent bilaterally. Limited intracranial: Negative Visualized orbits: Negative Mastoids and visualized paranasal sinuses: Extensive mucosal edema throughout the paranasal sinuses. Air-fluid level in the left sphenoid sinus. Mastoid sinus clear bilaterally. Skeleton: Cervical disc degeneration and spondylosis. No fracture or bony mass  lesion. Upper chest: Chest CT reported separately. IMPRESSION: Negative for mass or adenopathy in the neck. No evidence of recurrent tumor. Electronically Signed   By: Franchot Gallo M.D.   On: 08/03/2015 08:59   Ct Chest W Contrast  08/03/2015  CLINICAL DATA:  Non-Hodgkin's lymphoma of the tonsil diagnosed 2013, chemotherapy and XRT complete. Colon cancer diagnosed 2010, status post colon resection. EXAM: CT CHEST, ABDOMEN, AND PELVIS WITH CONTRAST TECHNIQUE: Multidetector CT imaging of the chest, abdomen and pelvis was performed following the standard protocol during bolus administration of intravenous contrast. CONTRAST:  1103mL ISOVUE-300 IOPAMIDOL (ISOVUE-300) INJECTION 61% COMPARISON:  PET-CT dated 01/20/2013 FINDINGS: CT CHEST FINDINGS Mediastinum/Nodes: The heart is normal in size. No pericardial effusion. Mild atherosclerotic calcifications aortic arch. Calcified bilateral hilar and right  infrahilar nodes. No suspicious mediastinal lymphadenopathy. Visualized thyroid is unremarkable. Lungs/Pleura: Calcified granulomata in the left upper lobe.a additional calcified granuloma in the right middle lobe. 11 mm ground-glass nodule in the posterior left upper lobe (series 8/ image 43), previously 9 mm in 2014. Otherwise, no suspicious pulmonary nodules. No focal consolidation.  Biapical pleural-parenchymal scarring. No pleural effusion or pneumothorax. Musculoskeletal: Visualized osseous structures are within normal limits. CT ABDOMEN PELVIS FINDINGS Hepatobiliary: Liver is within normal limits. No suspicious/enhancing hepatic lesions. Gallbladder is unremarkable. No intrahepatic or extrahepatic ductal dilatation. Pancreas: Within normal limits. Spleen: Calcified splenic granulomata. Adrenals/Urinary Tract: Adrenal glands are within normal limits. 6 mm medial right upper pole renal cyst. No suspicious/enhancing renal lesions. No hydronephrosis. Bladder is mildly thick-walled although underdistended. Stomach/Bowel: Stomach is within normal limits. No evidence of bowel obstruction. Prior appendectomy. Prior sigmoid resection with suture line in the left lower pelvis (series 2/ image 115). Vascular/Lymphatic: No evidence of abdominal aortic aneurysm. Atherosclerotic calcifications of the abdominal aorta and branch vessels. No suspicious abdominopelvic lymphadenopathy. Reproductive: Prostatomegaly, with enlargement of the central gland which indents the base of the bladder. Other: No abdominopelvic ascites. Musculoskeletal: Degenerative changes of the lumbar spine. Bilateral pars defects at L5-S1. IMPRESSION: Prior sigmoid resection. No evidence of recurrent or metastatic disease. No suspicious lymphadenopathy in the chest, abdomen, or pelvis in this patient with history of NHL. Spleen is normal in size. Sequela of prior granulomatous disease, as above. 11 mm ground-glass nodule in the posterior left upper  lobe, previously 9 mm in 2014. Annual follow-up CT chest is suggested. Additional ancillary findings as above. Electronically Signed   By: Julian Hy M.D.   On: 08/03/2015 09:03   Ct Abdomen Pelvis W Contrast  08/03/2015  CLINICAL DATA:  Non-Hodgkin's lymphoma of the tonsil diagnosed 2013, chemotherapy and XRT complete. Colon cancer diagnosed 2010, status post colon resection. EXAM: CT CHEST, ABDOMEN, AND PELVIS WITH CONTRAST TECHNIQUE: Multidetector CT imaging of the chest, abdomen and pelvis was performed following the standard protocol during bolus administration of intravenous contrast. CONTRAST:  166mL ISOVUE-300 IOPAMIDOL (ISOVUE-300) INJECTION 61% COMPARISON:  PET-CT dated 01/20/2013 FINDINGS: CT CHEST FINDINGS Mediastinum/Nodes: The heart is normal in size. No pericardial effusion. Mild atherosclerotic calcifications aortic arch. Calcified bilateral hilar and right infrahilar nodes. No suspicious mediastinal lymphadenopathy. Visualized thyroid is unremarkable. Lungs/Pleura: Calcified granulomata in the left upper lobe.a additional calcified granuloma in the right middle lobe. 11 mm ground-glass nodule in the posterior left upper lobe (series 8/ image 43), previously 9 mm in 2014. Otherwise, no suspicious pulmonary nodules. No focal consolidation.  Biapical pleural-parenchymal scarring. No pleural effusion or pneumothorax. Musculoskeletal: Visualized osseous structures are within normal limits. CT ABDOMEN PELVIS FINDINGS Hepatobiliary: Liver is within normal  limits. No suspicious/enhancing hepatic lesions. Gallbladder is unremarkable. No intrahepatic or extrahepatic ductal dilatation. Pancreas: Within normal limits. Spleen: Calcified splenic granulomata. Adrenals/Urinary Tract: Adrenal glands are within normal limits. 6 mm medial right upper pole renal cyst. No suspicious/enhancing renal lesions. No hydronephrosis. Bladder is mildly thick-walled although underdistended. Stomach/Bowel: Stomach is  within normal limits. No evidence of bowel obstruction. Prior appendectomy. Prior sigmoid resection with suture line in the left lower pelvis (series 2/ image 115). Vascular/Lymphatic: No evidence of abdominal aortic aneurysm. Atherosclerotic calcifications of the abdominal aorta and branch vessels. No suspicious abdominopelvic lymphadenopathy. Reproductive: Prostatomegaly, with enlargement of the central gland which indents the base of the bladder. Other: No abdominopelvic ascites. Musculoskeletal: Degenerative changes of the lumbar spine. Bilateral pars defects at L5-S1. IMPRESSION: Prior sigmoid resection. No evidence of recurrent or metastatic disease. No suspicious lymphadenopathy in the chest, abdomen, or pelvis in this patient with history of NHL. Spleen is normal in size. Sequela of prior granulomatous disease, as above. 11 mm ground-glass nodule in the posterior left upper lobe, previously 9 mm in 2014. Annual follow-up CT chest is suggested. Additional ancillary findings as above. Electronically Signed   By: Julian Hy M.D.   On: 08/03/2015 09:03     ASSESSMENT & PLAN:  Non-Hodgkin's lymphoma of tonsil CT scan showed no evidence of disease. I will see him back year from now with history, physical examination and blood work  Multiple lung nodules on CT The patient is asymptomatic. Per radiologist recommendation, I will order follow-up CT in one year  History of bladder cancer The patient has history of bladder cancer. I recommend further urology evaluation. He has appointment pending next month CT scan showed nonspecific wall thickening  History of colon cancer CT scan showed no evidence of cancer. He had recent nausea and weight loss. His symptoms have improved since anti-emetics and IV fluid last week I recommend follow-up appointment with GI   No orders of the defined types were placed in this encounter.   All questions were answered. The patient knows to call the clinic  with any problems, questions or concerns. No barriers to learning was detected. I spent 15 minutes counseling the patient face to face. The total time spent in the appointment was 20 minutes and more than 50% was on counseling and review of test results     [facility], [place], MD 08/03/2015 9:49 AM

## 2015-08-04 ENCOUNTER — Encounter: Payer: Self-pay | Admitting: Hematology and Oncology

## 2015-08-04 NOTE — Progress Notes (Signed)
I faxed [facility]  Notes/labs for denial appeal for zofran

## 2015-08-08 ENCOUNTER — Other Ambulatory Visit: Payer: Medicare Other

## 2015-08-08 ENCOUNTER — Ambulatory Visit: Payer: Medicare Other | Admitting: Hematology and Oncology

## 2015-08-09 ENCOUNTER — Ambulatory Visit: Payer: Medicare Other | Admitting: Hematology and Oncology

## 2015-08-09 ENCOUNTER — Other Ambulatory Visit: Payer: Medicare Other

## 2015-08-12 ENCOUNTER — Telehealth: Payer: Self-pay | Admitting: *Deleted

## 2015-08-12 ENCOUNTER — Encounter: Payer: Self-pay | Admitting: Hematology and Oncology

## 2015-08-12 NOTE — Progress Notes (Signed)
resent notes for appeal for zofran--see prev notes

## 2015-08-12 NOTE — Telephone Encounter (Signed)
Message received from [facility]. Appeal received, will be a 7 day or less turnaround.

## 2015-08-18 ENCOUNTER — Telehealth: Payer: Self-pay

## 2015-08-18 NOTE — Telephone Encounter (Signed)
[name] from [place] called to report status of appeal. Ondansetron original denial held up on appeal d/t diagnosis drug is being requested for is not FDA approved for ondansetron. [name] will be sending out letter tomorrow.

## 2015-08-22 ENCOUNTER — Encounter: Payer: Self-pay | Admitting: Hematology and Oncology

## 2015-08-22 NOTE — Progress Notes (Signed)
Left mess for paul in ref to appeal at [phone]. I advised to call me to advise what else is needed. I have already sent notes

## 2015-08-30 DIAGNOSIS — N401 Enlarged prostate with lower urinary tract symptoms: Secondary | ICD-10-CM | POA: Diagnosis not present

## 2015-08-30 DIAGNOSIS — N329 Bladder disorder, unspecified: Secondary | ICD-10-CM | POA: Diagnosis not present

## 2015-08-30 DIAGNOSIS — R350 Frequency of micturition: Secondary | ICD-10-CM | POA: Diagnosis not present

## 2015-08-30 DIAGNOSIS — R351 Nocturia: Secondary | ICD-10-CM | POA: Diagnosis not present

## 2015-08-30 DIAGNOSIS — C679 Malignant neoplasm of bladder, unspecified: Secondary | ICD-10-CM | POA: Diagnosis not present

## 2016-01-06 ENCOUNTER — Encounter: Payer: Self-pay | Admitting: Family Medicine

## 2016-01-06 ENCOUNTER — Ambulatory Visit (INDEPENDENT_AMBULATORY_CARE_PROVIDER_SITE_OTHER): Payer: Medicare Other | Admitting: Family Medicine

## 2016-01-06 ENCOUNTER — Encounter: Payer: Self-pay | Admitting: Hematology and Oncology

## 2016-01-06 VITALS — BP 136/84 | HR 83 | Temp 97.0°F | Ht 69.0 in | Wt 140.5 lb

## 2016-01-06 DIAGNOSIS — N400 Enlarged prostate without lower urinary tract symptoms: Secondary | ICD-10-CM | POA: Diagnosis not present

## 2016-01-06 DIAGNOSIS — J339 Nasal polyp, unspecified: Secondary | ICD-10-CM

## 2016-01-06 MED ORDER — FLUTICASONE PROPIONATE 50 MCG/ACT NA SUSP: 1.0000 | Freq: Two times a day (BID) | NASAL | 6 refills | Status: DC | PRN

## 2016-01-06 NOTE — Progress Notes (Signed)
BP 136/84   Pulse 83   Temp 97 F (36.1 C) (Oral)   Ht 5\' 9"  (1.753 m)   Wt 140 lb 8 oz (63.7 kg)   BMI 20.75 kg/m    Subjective:    Patient ID: Kenneth Reed, male    DOB: 1945/06/14, 70 y.o.   MRN: [id number]  HPI: Kenneth Reed is a 70 y.o. male presenting on 01/06/2016 for Epistaxis (bad nosebleed on Wednesday, bled for a long time, finally got it stopped)   HPI Nosebleeds and nasal polyps 2 days ago patient had a nosebleed that he had trouble stopping and it bled for quite some time. He said he has had a history of nasal polyps that have bleeding before. He has seen ENT for this previously. He has had some nasal congestion and postnasal drainage especially overnight except a little congested and believes his nosebleeds could be related to that. He denies any lightheadedness or dizziness or fatigue or lack of energy over the past couple days. The nosebleed did finally stop on its own and he has not had any since.  Relevant past medical, surgical, family and social history reviewed and updated as indicated. Interim medical history since our last visit reviewed. Allergies and medications reviewed and updated.  Review of Systems  Constitutional: Negative for chills and fever.  HENT: Positive for congestion and nosebleeds. Negative for sinus pressure and sore throat.   Eyes: Negative for discharge.  Respiratory: Negative for cough, shortness of breath and wheezing.   Cardiovascular: Negative for chest pain and leg swelling.  Musculoskeletal: Negative for back pain and gait problem.  Skin: Negative for rash.  All other systems reviewed and are negative.   Per HPI unless specifically indicated above       Objective:    BP 136/84   Pulse 83   Temp 97 F (36.1 C) (Oral)   Ht 5\' 9"  (1.753 m)   Wt 140 lb 8 oz (63.7 kg)   BMI 20.75 kg/m   Wt Readings from Last 3 Encounters:  01/06/16 140 lb 8 oz (63.7 kg)  08/03/15 143 lb 8 oz (65.1 kg)  07/26/15 140 lb 9.6 oz (63.8  kg)    Physical Exam  Constitutional: He is oriented to person, place, and time. He appears well-developed and well-nourished. No distress.  HENT:  Right Ear: Tympanic membrane and ear canal normal.  Left Ear: Tympanic membrane and ear canal normal.  Nose: Mucosal edema present. No sinus tenderness or septal deviation. No epistaxis.  No foreign bodies. Right sinus exhibits no maxillary sinus tenderness and no frontal sinus tenderness. Left sinus exhibits no maxillary sinus tenderness and no frontal sinus tenderness.  Mouth/Throat: Uvula is midline, oropharynx is clear and moist and mucous membranes are normal.  Eyes: Conjunctivae are normal. Right eye exhibits no discharge. Left eye exhibits no discharge. No scleral icterus.  Musculoskeletal: Normal range of motion. He exhibits no edema.  Neurological: He is alert and oriented to person, place, and time. Coordination normal.  Skin: Skin is warm and dry. No rash noted. He is not diaphoretic.  Psychiatric: He has a normal mood and affect. His behavior is normal.  Nursing note and vitals reviewed.      Assessment & Plan:   Problem List Items Addressed This Visit      Genitourinary   BPH (benign prostatic hyperplasia)   Relevant Orders   PSA, total and free    Other Visit Diagnoses    Nasal polyp    -  Primary   Patient has a history of nasal polyps and then developed a nosebleed, do not see a polyp but the likelihood as he could have another one. Use Flonase   Relevant Medications   fluticasone (FLONASE) 50 MCG/ACT nasal spray   Other Relevant Orders   CBC with Differential/Platelet       Follow up plan: Return if symptoms worsen or fail to improve.  Counseling provided for all of the vaccine components Orders Placed This Encounter  Procedures  . CBC with Differential/Platelet  . PSA, total and free    Caryl Pina, MD [facility] 01/06/2016, 8:21 AM

## 2016-01-07 LAB — CMP14+EGFR
ALK PHOS: 68 IU/L (ref 39–117)
ALT: 17 IU/L (ref 0–44)
AST: 22 IU/L (ref 0–40)
Albumin/Globulin Ratio: 1.8 (ref 1.2–2.2)
Albumin: 4.4 g/dL (ref 3.5–4.8)
BILIRUBIN TOTAL: 1 mg/dL (ref 0.0–1.2)
BUN/Creatinine Ratio: 13 (ref 10–24)
BUN: 12 mg/dL (ref 8–27)
CHLORIDE: 100 mmol/L (ref 96–106)
CO2: 23 mmol/L (ref 18–29)
Calcium: 9.2 mg/dL (ref 8.6–10.2)
Creatinine, Ser: 0.93 mg/dL (ref 0.76–1.27)
GFR calc Af Amer: 94 mL/min/{1.73_m2} (ref >59)
GFR calc non Af Amer: 82 mL/min/{1.73_m2} (ref >59)
GLUCOSE: 90 mg/dL (ref 65–99)
Globulin, Total: 2.5 g/dL (ref 1.5–4.5)
Potassium: 4.9 mmol/L (ref 3.5–5.2)
Sodium: 140 mmol/L (ref 134–144)
Total Protein: 6.9 g/dL (ref 6.0–8.5)

## 2016-01-07 LAB — CBC WITH DIFFERENTIAL/PLATELET
BASOS ABS: 0 10*3/uL (ref 0.0–0.2)
BASOS: 0 %
EOS (ABSOLUTE): 0.1 10*3/uL (ref 0.0–0.4)
Eos: 3 %
HEMOGLOBIN: 15 g/dL (ref 12.6–17.7)
Hematocrit: 42.9 % (ref 37.5–51.0)
IMMATURE GRANS (ABS): 0 10*3/uL (ref 0.0–0.1)
IMMATURE GRANULOCYTES: 0 %
LYMPHS: 23 %
Lymphocytes Absolute: 0.7 10*3/uL (ref 0.7–3.1)
MCH: 31.4 pg (ref 26.6–33.0)
MCHC: 35 g/dL (ref 31.5–35.7)
MCV: 90 fL (ref 79–97)
MONOCYTES: 6 %
Monocytes Absolute: 0.2 10*3/uL (ref 0.1–0.9)
NEUTROS PCT: 68 %
Neutrophils Absolute: 2 10*3/uL (ref 1.4–7.0)
PLATELETS: 205 10*3/uL (ref 150–379)
RBC: 4.77 x10E6/uL (ref 4.14–5.80)
RDW: 12.9 % (ref 12.3–15.4)
WBC: 3 10*3/uL — ABNORMAL LOW (ref 3.4–10.8)

## 2016-01-07 LAB — PSA, TOTAL AND FREE
PROSTATE SPECIFIC AG, SERUM: 1.9 ng/mL (ref 0.0–4.0)
PSA FREE PCT: 23.2 %
PSA, Free: 0.44 ng/mL

## 2016-01-09 ENCOUNTER — Telehealth: Payer: Self-pay | Admitting: Family Medicine

## 2016-01-09 NOTE — Telephone Encounter (Signed)
Spoke with daughter and results given 

## 2016-06-09 ENCOUNTER — Other Ambulatory Visit: Payer: Self-pay | Admitting: Family

## 2016-06-09 DIAGNOSIS — J42 Unspecified chronic bronchitis: Secondary | ICD-10-CM

## 2016-07-27 ENCOUNTER — Other Ambulatory Visit: Payer: Self-pay | Admitting: Hematology and Oncology

## 2016-07-27 DIAGNOSIS — C8599 Non-Hodgkin lymphoma, unspecified, extranodal and solid organ sites: Secondary | ICD-10-CM

## 2016-07-30 ENCOUNTER — Ambulatory Visit (HOSPITAL_COMMUNITY)
Admission: RE | Admit: 2016-07-30 | Discharge: 2016-07-30 | Disposition: A | Discharge status: Home / Self Care | Payer: Medicare Other | Source: Ambulatory Visit | Attending: Hematology and Oncology | Admitting: Hematology and Oncology

## 2016-07-30 ENCOUNTER — Other Ambulatory Visit (HOSPITAL_BASED_OUTPATIENT_CLINIC_OR_DEPARTMENT_OTHER): Payer: Medicare Other

## 2016-07-30 DIAGNOSIS — I700 Atherosclerosis of aorta: Secondary | ICD-10-CM | POA: Diagnosis not present

## 2016-07-30 DIAGNOSIS — Z8551 Personal history of malignant neoplasm of bladder: Secondary | ICD-10-CM | POA: Diagnosis not present

## 2016-07-30 DIAGNOSIS — R911 Solitary pulmonary nodule: Secondary | ICD-10-CM | POA: Diagnosis not present

## 2016-07-30 DIAGNOSIS — C8599 Non-Hodgkin lymphoma, unspecified, extranodal and solid organ sites: Secondary | ICD-10-CM | POA: Diagnosis not present

## 2016-07-30 DIAGNOSIS — N289 Disorder of kidney and ureter, unspecified: Secondary | ICD-10-CM | POA: Insufficient documentation

## 2016-07-30 DIAGNOSIS — R918 Other nonspecific abnormal finding of lung field: Secondary | ICD-10-CM | POA: Diagnosis present

## 2016-07-30 DIAGNOSIS — C8591 Non-Hodgkin lymphoma, unspecified, lymph nodes of head, face, and neck: Secondary | ICD-10-CM | POA: Diagnosis not present

## 2016-07-30 DIAGNOSIS — Z85038 Personal history of other malignant neoplasm of large intestine: Secondary | ICD-10-CM | POA: Insufficient documentation

## 2016-07-30 LAB — CBC WITH DIFFERENTIAL/PLATELET
BASO%: 0.8 % (ref 0.0–2.0)
Basophils Absolute: 0 10*3/uL (ref 0.0–0.1)
EOS ABS: 0.1 10*3/uL (ref 0.0–0.5)
EOS%: 1.9 % (ref 0.0–7.0)
HCT: 42.9 % (ref 38.4–49.9)
HEMOGLOBIN: 15.1 g/dL (ref 13.0–17.1)
LYMPH%: 24.4 % (ref 14.0–49.0)
MCH: 32 pg (ref 27.2–33.4)
MCHC: 35.1 g/dL (ref 32.0–36.0)
MCV: 91 fL (ref 79.3–98.0)
MONO#: 0.2 10*3/uL (ref 0.1–0.9)
MONO%: 6.6 % (ref 0.0–14.0)
NEUT#: 2.4 10*3/uL (ref 1.5–6.5)
NEUT%: 66.3 % (ref 39.0–75.0)
PLATELETS: 212 10*3/uL (ref 140–400)
RBC: 4.71 10*6/uL (ref 4.20–5.82)
RDW: 12.9 % (ref 11.0–14.6)
WBC: 3.7 10*3/uL — AB (ref 4.0–10.3)
lymph#: 0.9 10*3/uL (ref 0.9–3.3)

## 2016-07-30 LAB — COMPREHENSIVE METABOLIC PANEL
ALBUMIN: 4.2 g/dL (ref 3.5–5.0)
ALK PHOS: 61 U/L (ref 40–150)
ALT: 13 U/L (ref 0–55)
AST: 19 U/L (ref 5–34)
Anion Gap: 10 mEq/L (ref 3–11)
BILIRUBIN TOTAL: 1.01 mg/dL (ref 0.20–1.20)
BUN: 15.2 mg/dL (ref 7.0–26.0)
CO2: 25 mEq/L (ref 22–29)
CREATININE: 1 mg/dL (ref 0.7–1.3)
Calcium: 9.3 mg/dL (ref 8.4–10.4)
Chloride: 105 mEq/L (ref 98–109)
EGFR: 72 mL/min/{1.73_m2} — AB (ref >90)
GLUCOSE: 99 mg/dL (ref 70–140)
Potassium: 4.2 mEq/L (ref 3.5–5.1)
SODIUM: 140 meq/L (ref 136–145)
TOTAL PROTEIN: 7.2 g/dL (ref 6.4–8.3)

## 2016-07-30 LAB — LACTATE DEHYDROGENASE: LDH: 136 U/L (ref 125–245)

## 2016-07-30 MED ORDER — IOPAMIDOL (ISOVUE-300) INJECTION 61%
INTRAVENOUS | Status: AC
  Administered 2016-07-30: 75 mL
  Filled 2016-07-30: qty 75

## 2016-07-31 ENCOUNTER — Ambulatory Visit (HOSPITAL_BASED_OUTPATIENT_CLINIC_OR_DEPARTMENT_OTHER): Payer: Medicare Other | Admitting: Hematology and Oncology

## 2016-07-31 ENCOUNTER — Telehealth: Payer: Self-pay | Admitting: Hematology and Oncology

## 2016-07-31 ENCOUNTER — Encounter: Payer: Self-pay | Admitting: Hematology and Oncology

## 2016-07-31 VITALS — BP 141/83 | HR 79 | Temp 98.2°F | Resp 18 | Ht 69.0 in | Wt 140.2 lb

## 2016-07-31 DIAGNOSIS — C8591 Non-Hodgkin lymphoma, unspecified, lymph nodes of head, face, and neck: Secondary | ICD-10-CM

## 2016-07-31 DIAGNOSIS — C8599 Non-Hodgkin lymphoma, unspecified, extranodal and solid organ sites: Secondary | ICD-10-CM

## 2016-07-31 DIAGNOSIS — R918 Other nonspecific abnormal finding of lung field: Secondary | ICD-10-CM | POA: Diagnosis not present

## 2016-07-31 DIAGNOSIS — N281 Cyst of kidney, acquired: Secondary | ICD-10-CM | POA: Diagnosis not present

## 2016-07-31 DIAGNOSIS — Z85038 Personal history of other malignant neoplasm of large intestine: Secondary | ICD-10-CM

## 2016-07-31 NOTE — Telephone Encounter (Signed)
Appointments scheduled per 07/31/16 los. Patient was given a copy of the AVS report and appointment schedule per 07/31/16 los. °

## 2016-07-31 NOTE — Assessment & Plan Note (Signed)
This was noted on multiple imaging According to radiologist, this is likely benign Repeat CT scan in 2 years is recommended I plan to see him back next year without imaging study His next CT scan will be due around April 2020

## 2016-07-31 NOTE — Assessment & Plan Note (Addendum)
CT scan showed no evidence of cancer. His last colonoscopy was in 2015 and was reported as normal, next due 2020 I recommend follow-up appointment with GI

## 2016-07-31 NOTE — Assessment & Plan Note (Signed)
He is not symptomatic. This has been stable for the last 4 years Monitor closely

## 2016-07-31 NOTE — Progress Notes (Signed)
[place] OFFICE PROGRESS NOTE  Patient Care Team: Sharion Balloon, FNP as PCP - General (Nurse Practitioner) Melida Quitter, MD as Attending Physician (Otolaryngology) Miguel Dibble, MD as Attending Physician (Urology) Sadie Haber, MD as Referring Physician (Urology) Burke Keels, MD as Attending Physician (Surgery)  SUMMARY OF ONCOLOGIC HISTORY:   Non-Hodgkin's lymphoma of tonsil ([place])   07/30/2016 Imaging    Ct chest: 1. No evidence of active lymphoma or thoracic metastasis. 2. Similar left apical ground-glass nodule. Per consensus criteria, this warrants follow-up chest CT at 2 years. 3. Upper pole left renal lesion is favored to represent a complex cyst, given presence back to 2014. Recommend attention on follow-up. 4. Aortic atherosclerosis.      #1 history of follicular lymphoma of the left tonsil, high grade, status post R. [facility] chemotherapy and radiation therapy #2 history of bladder cancer in 2010 and local recurrence in 2013, on observation #3 stage II colon cancer status post hemicolectomy in 2010 without adjuvant treatment, on observation  INTERVAL HISTORY: Please see below for problem oriented charting. He feels well. Denies recent infection. No new lymphadenopathy. Denies hematuria or hematochezia. No changes in his bowel or bladder habits  REVIEW OF SYSTEMS:   Constitutional: Denies fevers, chills or abnormal weight loss Eyes: Denies blurriness of vision Ears, nose, mouth, throat, and face: Denies mucositis or sore throat Respiratory: Denies cough, dyspnea or wheezes Cardiovascular: Denies palpitation, chest discomfort or lower extremity swelling Gastrointestinal:  Denies nausea, heartburn or change in bowel habits Skin: Denies abnormal skin rashes Lymphatics: Denies new lymphadenopathy or easy bruising Neurological:Denies numbness, tingling or new weaknesses Behavioral/Psych: Mood is stable, no new changes  All other systems were reviewed with  the patient and are negative.  I have reviewed the past medical history, past surgical history, social history and family history with the patient and they are unchanged from previous note.  ALLERGIES:  has No Known Allergies.  MEDICATIONS:  Current Outpatient Prescriptions  Medication Sig Dispense Refill  . acetaminophen (TYLENOL) 500 MG tablet Take 500 mg by mouth every 6 (six) hours as needed (pain).    . ADVAIR DISKUS 250-50 MCG/DOSE AEPB INHALE TWO PUFFS BY MOUTH IN THE MORNING 60 each 6  . albuterol (PROVENTIL HFA;VENTOLIN HFA) 108 (90 Base) MCG/ACT inhaler Inhale 2 puffs into the lungs every 4 (four) hours as needed. For shortness of breath. 1 Inhaler 2  . finasteride (PROSCAR) 5 MG tablet Take 5 mg by mouth daily.    . fluticasone (FLONASE) 50 MCG/ACT nasal spray Place 1 spray into both nostrils 2 (two) times daily as needed for allergies or rhinitis. 16 g 6  . prochlorperazine (COMPAZINE) 10 MG tablet Take 1 tablet (10 mg total) by mouth every 6 (six) hours as needed for nausea or vomiting. 30 tablet 1  . tamsulosin (FLOMAX) 0.4 MG CAPS capsule Take 0.4 mg by mouth.     No current facility-administered medications for this visit.     PHYSICAL EXAMINATION: ECOG PERFORMANCE STATUS: 0 - Asymptomatic  Vitals:   07/31/16 0950  BP: (!) 141/83  Pulse: 79  Resp: 18  Temp: 98.2 F (36.8 C)   Filed Weights   07/31/16 0950  Weight: 140 lb 3.2 oz (63.6 kg)    GENERAL:alert, no distress and comfortable SKIN: skin color, texture, turgor are normal, no rashes or significant lesions EYES: normal, Conjunctiva are pink and non-injected, sclera clear OROPHARYNX:no exudate, no erythema and lips, buccal mucosa, and tongue normal  NECK: supple, thyroid  normal size, non-tender, without nodularity LYMPH:  no palpable lymphadenopathy in the cervical, axillary or inguinal LUNGS: clear to auscultation and percussion with normal breathing effort HEART: regular rate & rhythm and no murmurs and  no lower extremity edema ABDOMEN:abdomen soft, non-tender and normal bowel sounds Musculoskeletal:no cyanosis of digits and no clubbing  NEURO: alert & oriented x 3 with fluent speech, no focal motor/sensory deficits  LABORATORY DATA:  I have reviewed the data as listed    Component Value Date/Time   NA 140 07/30/2016 0952   K 4.2 07/30/2016 0952   CL 100 01/06/2016 0822   CL 107 07/11/2012 0817   CO2 25 07/30/2016 0952   GLUCOSE 99 07/30/2016 0952   GLUCOSE 96 07/11/2012 0817   BUN 15.2 07/30/2016 0952   CREATININE 1.0 07/30/2016 0952   CALCIUM 9.3 07/30/2016 0952   PROT 7.2 07/30/2016 0952   ALBUMIN 4.2 07/30/2016 0952   AST 19 07/30/2016 0952   ALT 13 07/30/2016 0952   ALKPHOS 61 07/30/2016 0952   BILITOT 1.01 07/30/2016 0952   GFRNONAA 82 01/06/2016 0822   GFRAA 94 01/06/2016 0822    No results found for: SPEP, UPEP  Lab Results  Component Value Date   WBC 3.7 (L) 07/30/2016   NEUTROABS 2.4 07/30/2016   HGB 15.1 07/30/2016   HCT 42.9 07/30/2016   MCV 91.0 07/30/2016   PLT 212 07/30/2016      Chemistry      Component Value Date/Time   NA 140 07/30/2016 0952   K 4.2 07/30/2016 0952   CL 100 01/06/2016 0822   CL 107 07/11/2012 0817   CO2 25 07/30/2016 0952   BUN 15.2 07/30/2016 0952   CREATININE 1.0 07/30/2016 0952      Component Value Date/Time   CALCIUM 9.3 07/30/2016 0952   ALKPHOS 61 07/30/2016 0952   AST 19 07/30/2016 0952   ALT 13 07/30/2016 0952   BILITOT 1.01 07/30/2016 0952       RADIOGRAPHIC STUDIES: I have personally reviewed the radiological images as listed and agreed with the findings in the report. Ct Chest W Contrast  Result Date: 07/30/2016 CLINICAL DATA:  Restaging of non-Hodgkin's lymphoma of tonsils diagnosed in 2013. Status post chemotherapy and radiation therapy. Lung nodules. Colon cancer and bladder cancer. EXAM: CT CHEST WITH CONTRAST TECHNIQUE: Multidetector CT imaging of the chest was performed during intravenous contrast  administration. CONTRAST:  62mL ISOVUE-300 IOPAMIDOL (ISOVUE-300) INJECTION 61% COMPARISON:  08/03/2015 FINDINGS: Cardiovascular: Aortic atherosclerosis. Normal heart size, without pericardial effusion. No central pulmonary embolism, on this non-dedicated study. Mediastinum/Nodes: No supraclavicular adenopathy. No mediastinal or hilar adenopathy. Extensive calcified mediastinal and bilateral hilar nodes, consistent with old granulomatous disease. Lungs/Pleura: No pleural fluid. Mild biapical pleural-parenchymal scarring. Posterior left apical ground-glass nodule measures 12 x 13 mm on image 36/series 5. Compare 13 x 11 mm on the prior exam (when remeasured). Left greater than right calcified pulmonary nodules which are consistent with old granulomatous disease. Upper Abdomen: Normal imaged portions of the liver, spleen, stomach, gallbladder, adrenal glands. No upper abdominal adenopathy. An upper pole right renal lesion of 7 mm is too small to characterize but likely a cyst. An exophytic upper pole left renal lesion measures greater than fluid density and 9 mm on image 160/series 2. This is similar in size to on the prior exam and present (felt to be similar in size) back on 07/09/2012 PET. Musculoskeletal: No acute osseous abnormality. IMPRESSION: 1. No evidence of active lymphoma or thoracic metastasis. 2.  Similar left apical ground-glass nodule. Per consensus criteria, this warrants follow-up chest CT at 2 years. This recommendation follows the consensus statement: Guidelines for Management of Small Pulmonary Nodules Detected on CT Images: From the Fleischner Society 2017; Radiology 2017; 284:228-243. 3. Upper pole left renal lesion is favored to represent a complex cyst, given presence back to 2014. Recommend attention on follow-up. 4.  Aortic atherosclerosis. Electronically Signed   By: Abigail Miyamoto M.D.   On: 07/30/2016 13:24    ASSESSMENT & PLAN:  Non-Hodgkin's lymphoma of tonsil CT scan showed no  evidence of disease. I will see him back year from now with history, physical examination and blood work  Multiple lung nodules on CT This was noted on multiple imaging According to radiologist, this is likely benign Repeat CT scan in 2 years is recommended I plan to see him back next year without imaging study His next CT scan will be due around April 2020  History of colon cancer CT scan showed no evidence of cancer. His last colonoscopy was in 2015 and was reported as normal, next due 2020 I recommend follow-up appointment with GI  Cyst of left kidney He is not symptomatic. This has been stable for the last 4 years Monitor closely   Orders Placed This Encounter  Procedures  . Comprehensive metabolic panel    Standing Status:   Future    Standing Expiration Date:   09/04/2017  . CBC with Differential/Platelet    Standing Status:   Future    Standing Expiration Date:   09/04/2017   All questions were answered. The patient knows to call the clinic with any problems, questions or concerns. No barriers to learning was detected. I spent 15 minutes counseling the patient face to face. The total time spent in the appointment was 20 minutes and more than 50% was on counseling and review of test results     Heath Lark, MD 07/31/2016 10:35 AM

## 2016-07-31 NOTE — Assessment & Plan Note (Signed)
CT scan showed no evidence of disease. I will see him back year from now with history, physical examination and blood work

## 2016-09-12 ENCOUNTER — Ambulatory Visit (INDEPENDENT_AMBULATORY_CARE_PROVIDER_SITE_OTHER): Payer: Medicare Other | Admitting: Urology

## 2016-09-12 DIAGNOSIS — N401 Enlarged prostate with lower urinary tract symptoms: Secondary | ICD-10-CM | POA: Diagnosis not present

## 2016-09-12 DIAGNOSIS — C670 Malignant neoplasm of trigone of bladder: Secondary | ICD-10-CM

## 2016-09-24 DIAGNOSIS — N401 Enlarged prostate with lower urinary tract symptoms: Secondary | ICD-10-CM | POA: Diagnosis not present

## 2016-11-29 DIAGNOSIS — K635 Polyp of colon: Secondary | ICD-10-CM | POA: Insufficient documentation

## 2016-12-11 DIAGNOSIS — Z85038 Personal history of other malignant neoplasm of large intestine: Secondary | ICD-10-CM | POA: Diagnosis not present

## 2016-12-11 DIAGNOSIS — Z1211 Encounter for screening for malignant neoplasm of colon: Secondary | ICD-10-CM | POA: Diagnosis not present

## 2017-01-16 DIAGNOSIS — Z23 Encounter for immunization: Secondary | ICD-10-CM | POA: Diagnosis not present

## 2017-01-22 ENCOUNTER — Ambulatory Visit (INDEPENDENT_AMBULATORY_CARE_PROVIDER_SITE_OTHER): Payer: Medicare Other | Admitting: Family

## 2017-01-22 ENCOUNTER — Encounter: Payer: Self-pay | Admitting: Family

## 2017-01-22 VITALS — BP 138/83 | HR 91 | Temp 98.0°F | Ht 69.0 in | Wt 138.8 lb

## 2017-01-22 DIAGNOSIS — R350 Frequency of micturition: Secondary | ICD-10-CM

## 2017-01-22 DIAGNOSIS — R300 Dysuria: Secondary | ICD-10-CM | POA: Diagnosis not present

## 2017-01-22 DIAGNOSIS — N401 Enlarged prostate with lower urinary tract symptoms: Secondary | ICD-10-CM

## 2017-01-22 LAB — MICROSCOPIC EXAMINATION
BACTERIA UA: NONE SEEN
EPITHELIAL CELLS (NON RENAL): NONE SEEN /HPF (ref 0–10)
RBC MICROSCOPIC, UA: NONE SEEN /HPF (ref 0–?)
Renal Epithel, UA: NONE SEEN /hpf
WBC, UA: NONE SEEN /hpf (ref 0–?)

## 2017-01-22 LAB — URINALYSIS, COMPLETE
Bilirubin, UA: NEGATIVE
Glucose, UA: NEGATIVE
Ketones, UA: NEGATIVE
Leukocytes, UA: NEGATIVE
Nitrite, UA: NEGATIVE
Protein, UA: NEGATIVE
RBC, UA: NEGATIVE
Specific Gravity, UA: 1.01 (ref 1.005–1.030)
Urobilinogen, Ur: 0.2 mg/dL (ref 0.2–1.0)
pH, UA: 6.5 (ref 5.0–7.5)

## 2017-01-22 NOTE — Progress Notes (Signed)
   Subjective:    Patient ID: Kenneth Reed, male    DOB: 12-22-1945, 71 y.o.   MRN: [id number]  Pt presents to the office today for dysuria. Pt is followed by Urologists annually for BPH.  Urinary Tract Infection   This is a new problem. The current episode started 1 to 4 weeks ago. The problem occurs every urination. The problem has been waxing and waning. The quality of the pain is described as burning. The pain is at a severity of 5/10. The pain is mild. There has been no fever. Associated symptoms include frequency, hesitancy and urgency. Pertinent negatives include no chills, discharge, flank pain, hematuria, nausea or vomiting. Associated symptoms comments: Straining . He has tried increased fluids for the symptoms. The treatment provided no relief.      Review of Systems  Constitutional: Negative for chills.  Gastrointestinal: Negative for nausea and vomiting.  Genitourinary: Positive for frequency, hesitancy and urgency. Negative for flank pain and hematuria.  All other systems reviewed and are negative.      Objective:   Physical Exam  Constitutional: He is oriented to person, place, and time. He appears well-developed and well-nourished. No distress.  HENT:  Head: Normocephalic.  Right Ear: External ear normal.  Left Ear: External ear normal.  Nose: Nose normal.  Mouth/Throat: Oropharynx is clear and moist.  Eyes: Pupils are equal, round, and reactive to light. Right eye exhibits no discharge. Left eye exhibits no discharge.  Neck: Normal range of motion. Neck supple. No thyromegaly present.  Cardiovascular: Normal rate, regular rhythm, normal heart sounds and intact distal pulses.   No murmur heard. Pulmonary/Chest: Effort normal and breath sounds normal. No respiratory distress. He has no wheezes.  Abdominal: Soft. Bowel sounds are normal. He exhibits no distension. There is no tenderness.  Musculoskeletal: Normal range of motion. He exhibits no edema or tenderness.    Neurological: He is alert and oriented to person, place, and time.  Skin: Skin is warm and dry. No rash noted. No erythema.  Psychiatric: He has a normal mood and affect. His behavior is normal. Judgment and thought content normal.  Vitals reviewed.    BP 138/83   Pulse 91   Temp 98 F (36.7 C) (Oral)   Ht 5\' 9"  (1.753 m)   Wt 138 lb 12.8 oz (63 kg)   BMI 20.50 kg/m      Assessment & Plan:  1. Urine frequency - Urinalysis, Complete - Urine Culture  2. Dysuria - Urine Culture  3. Benign prostatic hyperplasia with urinary frequency   Urine negative Culture pending Force fluids Continue Flomax and Proscar  If culture negative pt to call Urologists  RTO prn   Evelina Dun, FNP

## 2017-01-22 NOTE — Patient Instructions (Signed)
Benign Prostatic Hyperplasia °Benign prostatic hyperplasia is when the prostate gland is bigger than normal (enlarged). The prostate is a gland that produces the fluid that goes into semen. It is near the opening to the bladder and it surrounds the tube that drains urine out of the body (urethra). Benign prostatic hyperplasia is common among older men and it typically causes problems with urinating. °The prostate grows slowly as you age. As the prostate grows, it can pinch the urethra. This causes the bladder to work too hard to pass urine, which leads to a thickened bladder wall. The bladder may eventually become weak and unable to empty completely. °What are the causes? °The exact cause of this condition is not known. It may be related to changes in hormones as the body ages. °What increases the risk? °You are more likely to develop this condition if: °· You have a family history of the condition. °· You are age 40 or older. °· You have a history of erectile dysfunction. °· You do not exercise. °· You have certain medical conditions, including: °¨ Type 2 diabetes. °¨ Obesity. °¨ Heart and circulatory disease. °What are the signs or symptoms? °Symptoms of this condition include: °· Weak or interrupted urine stream. °· Dribbling or leaking urine. °· Feeling like the bladder has not emptied completely. °· Difficulty starting urination. °· Getting up frequently at night to urinate. °· Urinating more often (8 or more times a day). °· Accidental loss of urine (urinary incontinence). °· Pain during urination or ejaculation. °· Urine with an unusual smell or color. °The size of the prostate does not always determine the severity of the symptoms. For example, a man with a large prostate may experience minor symptoms, or a man with a smaller prostate may experience a severe blockage. °How is this diagnosed? °This condition may be diagnosed based on: °· Your medical history and symptoms. °· A physical exam. This usually  includes a digital rectal exam. During this exam, your health care provider places a gloved, lubricated finger into the rectum to feel the size of the prostate. °· A blood test. This test checks for high levels of a protein that is produced by the prostate (prostate specific antigen, PSA). °· Tests to examine how well the urethra and bladder are functioning (urodynamic tests). °· Cystoscopy. For this test, a small, tube-shaped instrument (cystoscope) is used to look inside the urethra and bladder. The cystoscope is placed into the urinary tract through the opening at the tip of the penis. °· Urine tests. °· Ultrasound. °How is this treated? °Treatment for this condition depends on how severe your symptoms are. Treatment may include: °· Active surveillance or "watchful waiting." If your symptoms are mild, your health care provider may delay treatment and ask you to keep track of your symptoms. You will have regular checkups to examine the size of your prostate, discuss symptoms, and determine whether treatment is needed. °· Medicines. These may be used to: °¨ Stop prostate growth. °¨ Shrink the prostate. °¨ Relieve symptoms. °· Lifestyle changes, including: °¨ Pelvic floor muscle exercises. The pelvic floor muscles are a group of muscles that relax when you urinate. °¨ Bladder training. This involves exercises that train the bladder to hold more urine for longer periods. °¨ Reducing the amount of liquid that you drink. This is especially important before sleeping and before long periods of time spent in public. °¨ Reducing the amount of caffeine and alcohol that you drink. °¨ Treating or preventing constipation. °·   Surgery to reduce the size of the prostate or widen the urethra. This is typically done if your symptoms are severe or there are serious complications from the enlarged prostate. °Follow these instructions at home: °Medicines  °· Take over-the-counter and prescription medicines as told by your health care  provider. °· Avoid certain medicines, such as decongestants, antihistamines, and some prescription medicines as told by your health care provider. Ask your health care provider which medicines you should avoid. °General instructions  °· Monitor your symptoms for any changes. Tell your health care provider about any changes. °· Give yourself time when you urinate. °· Avoid certain beverages that can irritate the bladder, such as: °¨ Alcohol. °¨ Caffeinated drinks like coffee, tea, and cola. °· Avoid drinking large amounts of liquid before bed or before going out in public. °· Do pelvic floor muscle or bladder training exercises as told by your health care provider. °· Keep all follow-up visits as told by your health care provider. This is important. °Contact a health care provider if: °· Your develop new or worse symptoms. °· You have trouble getting or maintaining an erection. °· You have a fever. °· You have pain or burning during urination. °· You have blood in your urine. °Get help right away if: °· You have severe pain when urinating. °· You cannot urinate. °· You have severe pain in your abdomen. °· You are dizzy. °· You faint. °· You have severe back pain. °· Your urine is dark red and difficult to see through. °· You have large blood clots in your urine. °· You have severe pain after an erection. °· You have chest pain, dizziness, or nausea during sexual activity. °Summary °· The prostate is a gland that produces the fluid that goes into semen. It is near the opening to the bladder and it surrounds the tube that drains urine out of the body (urethra). °· Benign prostatic hyperplasia is common among older men and it typically causes problems with urinating. °· If your symptoms are mild, your health care provider may delay treatment and ask you to keep track of your symptoms. You will have regular checkups to examine the size of your prostate, discuss symptoms, and determine whether treatment is needed. °· If  directed, you may need to avoid certain medicines, such as decongestants, antihistamines, and some prescription medicines. °· Contact your health care provider if you develop new or worse symptoms. °This information is not intended to replace advice given to you by your health care provider. Make sure you discuss any questions you have with your health care provider. °Document Released: 03/26/2005 Document Revised: 02/13/2016 Document Reviewed: 02/13/2016 °Elsevier Interactive Patient Education © 2017 [employer] ° °

## 2017-01-23 LAB — URINE CULTURE: ORGANISM ID, BACTERIA: NO GROWTH

## 2017-03-08 DIAGNOSIS — J209 Acute bronchitis, unspecified: Secondary | ICD-10-CM | POA: Diagnosis not present

## 2017-03-08 DIAGNOSIS — J441 Chronic obstructive pulmonary disease with (acute) exacerbation: Secondary | ICD-10-CM | POA: Diagnosis not present

## 2017-07-17 ENCOUNTER — Ambulatory Visit: Payer: Medicare Other | Admitting: Family Medicine

## 2017-07-17 ENCOUNTER — Ambulatory Visit (INDEPENDENT_AMBULATORY_CARE_PROVIDER_SITE_OTHER): Payer: Medicare Other

## 2017-07-17 ENCOUNTER — Encounter: Payer: Self-pay | Admitting: Family Medicine

## 2017-07-17 VITALS — BP 133/77 | HR 103 | Temp 97.0°F | Ht 69.0 in | Wt 136.1 lb

## 2017-07-17 DIAGNOSIS — R110 Nausea: Secondary | ICD-10-CM | POA: Diagnosis not present

## 2017-07-17 DIAGNOSIS — R112 Nausea with vomiting, unspecified: Secondary | ICD-10-CM | POA: Diagnosis not present

## 2017-07-17 LAB — URINALYSIS
BILIRUBIN UA: NEGATIVE
Glucose, UA: NEGATIVE
Leukocytes, UA: NEGATIVE
NITRITE UA: NEGATIVE
PH UA: 7.5 (ref 5.0–7.5)
RBC, UA: NEGATIVE
Specific Gravity, UA: 1.015 (ref 1.005–1.030)
UUROB: 1 mg/dL (ref 0.2–1.0)

## 2017-07-17 MED ORDER — ONDANSETRON 8 MG PO TBDP: 8.0000 mg | ORAL_TABLET | Freq: Four times a day (QID) | ORAL | 1 refills | Status: DC | PRN

## 2017-07-17 NOTE — Progress Notes (Signed)
Subjective:  Patient ID: Kenneth Reed, male    DOB: Feb 24, 1946  Age: 72 y.o. MRN: [id number]  CC: Nausea (pt here today c/o nausea for the past few days without vomiting)   HPI Kenneth Reed presents for onset a few days ago of nausea.  He has not had any vomiting.  He says he gets this about once a year and the doctor just gave him some nausea medicine.  That takes care of it.  He thinks it is related to the pollen that is moving currently.  His bowel movements have been regular.  He does note however that this morning his stool was a little loose.  He denies any abdominal pain.  He has had a decreased appetite.  He did not eat anything this morning.  Nausea was worse during the night last night.  He has had some sneezing and congestion as well.  He denies any tenesmus, hematochezia, melena. Depression screen [facility] 2/9 07/17/2017 01/22/2017 01/06/2016  Decreased Interest 0 0 0  Down, Depressed, Hopeless 0 0 1  PHQ - 2 Score 0 0 1    History Skyy has a past medical history of Bladder cancer ([place]) (2010), BPH (benign prostatic hyperplasia), Cancer ([place]) (03/26/12), Colon cancer ([place]) (2010), Fatigue, History of colon cancer (01/07/2013), Non-Hodgkin's lymphoma of head [facility]), Status post chemotherapy (04/18/2012 - 06/20/12), and Weight loss (07/11/12 note).   He has a past surgical history that includes Nasal polyp surgery (3086); Colectomy (2010); Bladder surgery (2010); Colonoscopy (2012); and Biospy Left Tonsil (03/26/12).   His family history includes Cancer in his father, maternal aunt, and paternal grandmother.He reports that he has never smoked. He has quit using smokeless tobacco. His smokeless tobacco use included chew. He reports that he drinks alcohol. He reports that he does not use drugs.    ROS Review of Systems  Constitutional: Negative.   HENT: Negative.   Eyes: Negative for visual disturbance.  Respiratory: Negative for shortness of breath.   Cardiovascular: Negative for chest  pain and leg swelling.  Gastrointestinal: Negative for abdominal pain, diarrhea, nausea and vomiting.  Genitourinary: Negative for difficulty urinating.  Musculoskeletal: Negative.   Skin: Negative for rash.  Neurological: Negative for headaches.  Psychiatric/Behavioral: Negative for sleep disturbance.    Objective:  BP 133/77   Pulse (!) 103   Temp (!) 97 F (36.1 C) (Oral)   Ht 5\' 9"  (1.753 m)   Wt 136 lb 2 oz (61.7 kg)   BMI 20.10 kg/m   BP Readings from Last 3 Encounters:  07/17/17 133/77  01/22/17 138/83  07/31/16 (!) 141/83    Wt Readings from Last 3 Encounters:  07/17/17 136 lb 2 oz (61.7 kg)  01/22/17 138 lb 12.8 oz (63 kg)  07/31/16 140 lb 3.2 oz (63.6 kg)     Physical Exam  Constitutional: He is oriented to person, place, and time. He appears well-developed and well-nourished.  HENT:  Head: Normocephalic and atraumatic.  Right Ear: External ear normal.  Left Ear: External ear normal.  Eyes: EOM are normal. No scleral icterus.  Neck: Normal range of motion.  Cardiovascular: Normal rate, regular rhythm and normal heart sounds.  No murmur heard. Pulmonary/Chest: Effort normal and breath sounds normal. No respiratory distress.  Abdominal: Soft. He exhibits no mass. There is tenderness (In the epigastric area, rather mild). There is no rebound and no guarding.  Bowel sounds are slightly increased without tympany.    Musculoskeletal: Normal range of motion.  Neurological: He  is alert and oriented to person, place, and time.  Psychiatric: He has a normal mood and affect.  Vitals reviewed.     Assessment & Plan:   Shawna was seen today for nausea.  Diagnoses and all orders for this visit:  Nausea -     DG Abd 2 Views; Future -     Urinalysis  Other orders -     ondansetron (ZOFRAN-ODT) 8 MG disintegrating tablet; Take 1 tablet (8 mg total) by mouth every 6 (six) hours as needed for nausea or vomiting.       I am having Ahmeer D. Felmlee start on  ondansetron. I am also having him maintain his acetaminophen, tamsulosin, finasteride, albuterol, prochlorperazine, fluticasone, and ADVAIR DISKUS.  Allergies as of 07/17/2017   No Known Allergies     Medication List        Accurate as of 07/17/17  8:34 AM. Always use your most recent med list.          acetaminophen 500 MG tablet Commonly known as:  TYLENOL Take 500 mg by mouth every 6 (six) hours as needed (pain).   ADVAIR DISKUS 250-50 MCG/DOSE Aepb Generic drug:  Fluticasone-Salmeterol INHALE TWO PUFFS BY MOUTH IN THE MORNING   albuterol 108 (90 Base) MCG/ACT inhaler Commonly known as:  PROVENTIL HFA;VENTOLIN HFA Inhale 2 puffs into the lungs every 4 (four) hours as needed. For shortness of breath.   finasteride 5 MG tablet Commonly known as:  PROSCAR Take 5 mg by mouth daily.   fluticasone 50 MCG/ACT nasal spray Commonly known as:  FLONASE Place 1 spray into both nostrils 2 (two) times daily as needed for allergies or rhinitis.   ondansetron 8 MG disintegrating tablet Commonly known as:  ZOFRAN-ODT Take 1 tablet (8 mg total) by mouth every 6 (six) hours as needed for nausea or vomiting.   prochlorperazine 10 MG tablet Commonly known as:  COMPAZINE Take 1 tablet (10 mg total) by mouth every 6 (six) hours as needed for nausea or vomiting.   tamsulosin 0.4 MG Caps capsule Commonly known as:  FLOMAX Take 0.4 mg by mouth.        Follow-up: Return in about 2 days (around 07/19/2017), or if symptoms worsen or fail to improve.  Claretta Fraise, M.D.

## 2017-07-18 ENCOUNTER — Telehealth: Payer: Self-pay

## 2017-07-18 NOTE — Telephone Encounter (Signed)
Insurance denied prior auth for Ondansetron

## 2017-07-19 ENCOUNTER — Other Ambulatory Visit: Payer: Self-pay

## 2017-07-19 ENCOUNTER — Inpatient Hospital Stay (HOSPITAL_BASED_OUTPATIENT_CLINIC_OR_DEPARTMENT_OTHER): Payer: Medicare Other | Admitting: Medical

## 2017-07-19 ENCOUNTER — Telehealth: Payer: Self-pay

## 2017-07-19 ENCOUNTER — Inpatient Hospital Stay: Payer: Medicare Other | Attending: Hematology and Oncology

## 2017-07-19 ENCOUNTER — Telehealth: Payer: Self-pay | Admitting: *Deleted

## 2017-07-19 VITALS — BP 130/76 | HR 92 | Temp 98.2°F | Resp 18 | Ht 69.0 in | Wt 137.4 lb

## 2017-07-19 DIAGNOSIS — Z8572 Personal history of non-Hodgkin lymphomas: Secondary | ICD-10-CM | POA: Diagnosis not present

## 2017-07-19 DIAGNOSIS — N400 Enlarged prostate without lower urinary tract symptoms: Secondary | ICD-10-CM

## 2017-07-19 DIAGNOSIS — Z79899 Other long term (current) drug therapy: Secondary | ICD-10-CM | POA: Insufficient documentation

## 2017-07-19 DIAGNOSIS — Z9221 Personal history of antineoplastic chemotherapy: Secondary | ICD-10-CM | POA: Insufficient documentation

## 2017-07-19 DIAGNOSIS — Z806 Family history of leukemia: Secondary | ICD-10-CM | POA: Insufficient documentation

## 2017-07-19 DIAGNOSIS — Z85038 Personal history of other malignant neoplasm of large intestine: Secondary | ICD-10-CM | POA: Diagnosis not present

## 2017-07-19 DIAGNOSIS — R5383 Other fatigue: Secondary | ICD-10-CM

## 2017-07-19 DIAGNOSIS — R634 Abnormal weight loss: Secondary | ICD-10-CM | POA: Diagnosis not present

## 2017-07-19 DIAGNOSIS — R110 Nausea: Secondary | ICD-10-CM | POA: Diagnosis not present

## 2017-07-19 DIAGNOSIS — Z8551 Personal history of malignant neoplasm of bladder: Secondary | ICD-10-CM | POA: Diagnosis not present

## 2017-07-19 DIAGNOSIS — C8599 Non-Hodgkin lymphoma, unspecified, extranodal and solid organ sites: Secondary | ICD-10-CM

## 2017-07-19 LAB — CBC WITH DIFFERENTIAL (CANCER CENTER ONLY)
BASOS ABS: 0 10*3/uL (ref 0.0–0.1)
Basophils Relative: 0 %
EOS PCT: 0 %
Eosinophils Absolute: 0 10*3/uL (ref 0.0–0.5)
HCT: 41 % (ref 38.4–49.9)
HEMOGLOBIN: 14.3 g/dL (ref 13.0–17.1)
LYMPHS PCT: 15 %
Lymphs Abs: 0.7 10*3/uL — ABNORMAL LOW (ref 0.9–3.3)
MCH: 31.6 pg (ref 27.2–33.4)
MCHC: 34.9 g/dL (ref 32.0–36.0)
MCV: 90.7 fL (ref 79.3–98.0)
Monocytes Absolute: 0.5 10*3/uL (ref 0.1–0.9)
Monocytes Relative: 12 %
NEUTROS PCT: 73 %
Neutro Abs: 3.2 10*3/uL (ref 1.5–6.5)
PLATELETS: 166 10*3/uL (ref 140–400)
RBC: 4.52 MIL/uL (ref 4.20–5.82)
RDW: 12.7 % (ref 11.0–14.6)
WBC: 4.4 10*3/uL (ref 4.0–10.3)

## 2017-07-19 LAB — CMP (CANCER CENTER ONLY)
ALT: 13 U/L (ref 0–55)
AST: 18 U/L (ref 5–34)
Albumin: 4.1 g/dL (ref 3.5–5.0)
Alkaline Phosphatase: 65 U/L (ref 40–150)
Anion gap: 8 (ref 3–11)
BUN: 9 mg/dL (ref 7–26)
CHLORIDE: 101 mmol/L (ref 98–109)
CO2: 28 mmol/L (ref 22–29)
CREATININE: 0.97 mg/dL (ref 0.70–1.30)
Calcium: 9.4 mg/dL (ref 8.4–10.4)
GFR, Est AFR Am: >60 mL/min (ref >60)
Glucose, Bld: 95 mg/dL (ref 70–140)
Potassium: 4.1 mmol/L (ref 3.5–5.1)
SODIUM: 137 mmol/L (ref 136–145)
Total Bilirubin: 0.5 mg/dL (ref 0.2–1.2)
Total Protein: 7.2 g/dL (ref 6.4–8.3)

## 2017-07-19 MED ORDER — SULFAMETHOXAZOLE-TRIMETHOPRIM 800-160 MG PO TABS: 1.0000 | ORAL_TABLET | Freq: Two times a day (BID) | ORAL | 0 refills | Status: DC

## 2017-07-19 MED ORDER — ONDANSETRON HCL 4 MG PO TABS: 4.0000 mg | ORAL_TABLET | Freq: Three times a day (TID) | ORAL | 0 refills | Status: DC | PRN

## 2017-07-19 MED ORDER — PREDNISONE 5 MG PO TABS: ORAL_TABLET | ORAL | 0 refills | Status: DC

## 2017-07-19 NOTE — Addendum Note (Signed)
Addended by: Evelina Dun A on: 07/19/2017 08:26 AM   Modules accepted: Orders

## 2017-07-19 NOTE — Telephone Encounter (Signed)
Zofran ODT changed to zofran per insurance

## 2017-07-19 NOTE — Telephone Encounter (Signed)
"  Amber Roland calling for my dad,Legacy D. Butrick.  Call [phone] to return my call."

## 2017-07-19 NOTE — Progress Notes (Signed)
Orders placed for CBC and CMP per Lucianne Lei PA.

## 2017-07-19 NOTE — Telephone Encounter (Signed)
Pt's daughter Luetta Nutting initially called because patient has been sick for a week and pt's appt is not till end of month.  She said she would be able to bring patient in around 2 pm if we could see him today. Called patient and he reports being nauseated, no vomiting, no diarrhea, stomach pain, and feels congested, tight in chest.  He would like to come in to be seen, been sick a week and has no energy. Notified Lucianne Lei PA and received orders for CBC / CMET and appt in symptom management.  Pt voiced understanding, daughter bringing him at 2 pm. Notified Dr Alvy Bimler also.

## 2017-07-22 NOTE — Progress Notes (Signed)
[facility]pt[facility] Progress Note   Kenneth Reed [id number] [birth year] 72 y.o.  [name] is managed by Dr. Heath Lark  Actively treated with chemotherapy: no   Assessment: Plan:    History of bladder cancer  History of colon cancer  Non-Hodgkin's lymphoma of tonsil (HCC)  Chronic nausea   History of bladder cancer, colon cancer, and non-Hodgkin's lymphoma: Mr. Desroches presented to the office today with a 7-day history of nausea.  He is concerned that this could represent a recurrence of 1 of his malignancies.  The patien was reassured that his labs and physical exam did not indicate a recurrence of a malignancy.  He was instructed to return to see Dr. Heath Lark as scheduled on 08/05/2017.  Nausea: The patient has a prescription for Zofran ODT 8 mg which he has not yet used.  I encouraged him to use these as needed.  Please see After Visit Summary for patient specific instructions.  Future Appointments  Date Time Provider [place]  07/23/2017  8:30 AM Ilean China, RN [facility] None  08/05/2017  9:30 AM [facility] None  08/05/2017 10:00 AM Heath Lark, MD [facility] None    No orders of the defined types were placed in this encounter.      Subjective:   Patient ID:  Kenneth Reed is a 72 y.o. (DOB 08-27-1945) male.  Chief Complaint:  Chief Complaint  Patient presents with  . Nausea  . Fatigue    HPI Kenneth Reed is a 72 year old male with a history of bladder cancer, colon cancer, and lymphoma who is followed by Dr. Heath Lark.  He was last seen on 07/31/2016.  He is scheduled to be seen in follow-up on 08/05/2017.  He reports a 7-day history of nausea without vomiting.  He is concerned that this could be evidence of recurrence of his cancer.  He denies fevers, chills, sweats, constipation, diarrhea, anorexia, or weight loss.  His granddaughter recently had conjunctivitis and an upper respiratory tract  infection.  Medications: I have reviewed the patient's current medications.  Allergies: No Known Allergies  Past Medical History:  Diagnosis Date  . Bladder cancer [facility]) 2010   Dr. [place] Pink operated; no need for chemo; recurred locally  in 2013 locally.    Marland Kitchen BPH (benign prostatic hyperplasia)   . Cancer ([place]) 03/26/12   High Grade NonHodgkin's B- Cell Lymphoma  . Colon cancer [facility]) 2010   Dr. Anthony Sar operated; stage II.  No need for chemo.  . Fatigue   . History of colon cancer 01/07/2013  . Non-Hodgkin's lymphoma of head ([place])   . Status post chemotherapy 04/18/2012 - 06/20/12    4 Cycles of R-CHOP with 3 Doses of Prophylactic Intrathecal chemo.  . Weight loss 07/11/12 note    Past Surgical History:  Procedure Laterality Date  . Biospy Left Tonsil  03/26/12   Non - Hodgkins B-Cell Lympyhoma  . BLADDER SURGERY  2010  . COLECTOMY  2010  . COLONOSCOPY  2012   next one 2014.   Marland Kitchen NASAL POLYP SURGERY  1983    Family History  Problem Relation Age of Onset  . Cancer Father        leukemia  . Cancer Maternal Aunt        leukemia  . Cancer Paternal Grandmother        leukemia    Social History   Socioeconomic History  . Marital status: Single    Spouse  name: Not on file  . Number of children: 2  . Years of education: Not on file  . Highest education level: Not on file  Occupational History    Employer: [employer]    Comment: textile.   Social Needs  . Financial resource strain: Not on file  . Food insecurity:    Worry: Not on file    Inability: Not on file  . Transportation needs:    Medical: Not on file    Non-medical: Not on file  Tobacco Use  . Smoking status: Never Smoker  . Smokeless tobacco: Former Systems developer    Types: Chew  Substance and Sexual Activity  . Alcohol use: Yes    Alcohol/week: 0.0 oz    Comment: Quit 2014  . Drug use: No  . Sexual activity: Not on file  Lifestyle  . Physical activity:    Days per week: Not on file    Minutes per session: Not  on file  . Stress: Not on file  Relationships  . Social connections:    Talks on phone: Not on file    Gets together: Not on file    Attends religious service: Not on file    Active member of club or organization: Not on file    Attends meetings of clubs or organizations: Not on file    Relationship status: Not on file  . Intimate partner violence:    Fear of current or ex partner: Not on file    Emotionally abused: Not on file    Physically abused: Not on file    Forced sexual activity: Not on file  Other Topics Concern  . Not on file  Social History Narrative  . Not on file    Past Medical History, Surgical history, Social history, and Family history were reviewed and updated as appropriate.   Please see review of systems for further details on the patient's review from today.   Review of Systems:  Review of Systems  Constitutional: Negative for appetite change, chills, diaphoresis, fatigue, fever and unexpected weight change.  HENT: Negative for mouth sores and trouble swallowing.   Respiratory: Negative for cough, choking, chest tightness, shortness of breath and wheezing.   Cardiovascular: Negative for chest pain, palpitations and leg swelling.  Gastrointestinal: Positive for nausea. Negative for constipation, diarrhea and vomiting.  Musculoskeletal: Negative for back pain, gait problem and myalgias.  Neurological: Negative for dizziness, weakness and headaches.  Hematological: Negative for adenopathy.    Objective:   Physical Exam:  BP 130/76 (BP Location: Left Arm, Patient Position: Sitting)   Pulse 92   Temp 98.2 F (36.8 C) (Oral)   Resp 18   Ht 5\' 9"  (1.753 m)   Wt 137 lb 6.4 oz (62.3 kg)   SpO2 100%   BMI 20.29 kg/m  ECOG: 0  Physical Exam  Constitutional: No distress.  HENT:  Head: Normocephalic and atraumatic.  Mouth/Throat: Oropharynx is clear and moist.  Eyes: Conjunctivae are normal. Right eye exhibits no discharge. Left eye exhibits no  discharge. No scleral icterus.  Neck: Normal range of motion. Neck supple.  Cardiovascular: Normal rate, regular rhythm and normal heart sounds. Exam reveals no gallop and no friction rub.  No murmur heard. Pulses:      Femoral pulses are 2+ on the right side, and 2+ on the left side. No femoral or carotid bruits bilaterally.  Pulmonary/Chest: Effort normal and breath sounds normal. No respiratory distress. He has no wheezes. He has no rales.  Abdominal: Soft. Bowel sounds are normal. He exhibits no distension and no mass. There is no tenderness. There is no rebound and no guarding.  Musculoskeletal: He exhibits no edema.  Lymphadenopathy:    He has no cervical adenopathy.  Neurological: He is alert. He exhibits normal muscle tone. Coordination normal.  Skin: Skin is warm and dry. He is not diaphoretic.  Psychiatric: He has a normal mood and affect. His behavior is normal. Judgment and thought content normal.    Lab Review:     Component Value Date/Time   NA 137 07/19/2017 1357   NA 140 07/30/2016 0952   K 4.1 07/19/2017 1357   K 4.2 07/30/2016 0952   CL 101 07/19/2017 1357   CL 107 07/11/2012 0817   CO2 28 07/19/2017 1357   CO2 25 07/30/2016 0952   GLUCOSE 95 07/19/2017 1357   GLUCOSE 99 07/30/2016 0952   GLUCOSE 96 07/11/2012 0817   BUN 9 07/19/2017 1357   BUN 15.2 07/30/2016 0952   CREATININE 0.97 07/19/2017 1357   CREATININE 1.0 07/30/2016 0952   CALCIUM 9.4 07/19/2017 1357   CALCIUM 9.3 07/30/2016 0952   PROT 7.2 07/19/2017 1357   PROT 7.2 07/30/2016 0952   ALBUMIN 4.1 07/19/2017 1357   ALBUMIN 4.2 07/30/2016 0952   AST 18 07/19/2017 1357   AST 19 07/30/2016 0952   ALT 13 07/19/2017 1357   ALT 13 07/30/2016 0952   ALKPHOS 65 07/19/2017 1357   ALKPHOS 61 07/30/2016 0952   BILITOT 0.5 07/19/2017 1357   BILITOT 1.01 07/30/2016 0952   GFRNONAA >60 07/19/2017 1357   GFRAA >60 07/19/2017 1357       Component Value Date/Time   WBC 4.4 07/19/2017 1357   WBC 3.7  (L) 07/30/2016 0952   WBC 5.8 09/19/2013 1614   RBC 4.52 07/19/2017 1357   HGB 15.1 07/30/2016 0952   HCT 41.0 07/19/2017 1357   HCT 42.9 07/30/2016 0952   PLT 166 07/19/2017 1357   PLT 212 07/30/2016 0952   PLT 205 01/06/2016 0822   MCV 90.7 07/19/2017 1357   MCV 91.0 07/30/2016 0952   MCH 31.6 07/19/2017 1357   MCHC 34.9 07/19/2017 1357   RDW 12.7 07/19/2017 1357   RDW 12.9 07/30/2016 0952   LYMPHSABS 0.7 (L) 07/19/2017 1357   LYMPHSABS 0.9 07/30/2016 0952   MONOABS 0.5 07/19/2017 1357   MONOABS 0.2 07/30/2016 0952   EOSABS 0.0 07/19/2017 1357   EOSABS 0.1 07/30/2016 0952   EOSABS 0.1 01/06/2016 0822   BASOSABS 0.0 07/19/2017 1357   BASOSABS 0.0 07/30/2016 0952   -------------------------------  Imaging from last 24 hours (if applicable):  Radiology interpretation: Dg Abd 2 Views  Result Date: 07/17/2017 CLINICAL DATA:  Nausea, vomiting EXAM: ABDOMEN - 2 VIEW COMPARISON:  CT 08/03/2015 FINDINGS: Nonobstructive bowel gas pattern. No free air organomegaly. No suspicious calcification. Mild rightward scoliosis in the lumbar spine. No acute bony abnormality. IMPRESSION: No acute findings. Electronically Signed   By: Rolm Baptise M.D.   On: 07/17/2017 09:29

## 2017-07-23 ENCOUNTER — Encounter: Payer: Self-pay | Admitting: *Deleted

## 2017-07-23 ENCOUNTER — Ambulatory Visit (INDEPENDENT_AMBULATORY_CARE_PROVIDER_SITE_OTHER): Payer: Medicare Other | Admitting: *Deleted

## 2017-07-23 VITALS — BP 116/59 | HR 77 | Ht 69.5 in | Wt 137.0 lb

## 2017-07-23 DIAGNOSIS — R5383 Other fatigue: Secondary | ICD-10-CM

## 2017-07-23 DIAGNOSIS — Z0000 Encounter for general adult medical examination without abnormal findings: Secondary | ICD-10-CM

## 2017-07-23 DIAGNOSIS — J42 Unspecified chronic bronchitis: Secondary | ICD-10-CM

## 2017-07-23 MED ORDER — ALBUTEROL SULFATE HFA 108 (90 BASE) MCG/ACT IN AERS: 2.0000 | INHALATION_SPRAY | RESPIRATORY_TRACT | 2 refills | Status: DC | PRN

## 2017-07-23 MED ORDER — FLUTICASONE-SALMETEROL 250-50 MCG/DOSE IN AEPB
INHALATION_SPRAY | RESPIRATORY_TRACT | 6 refills | Status: DC
Start: 2017-07-23 — End: 2019-09-09

## 2017-07-23 NOTE — Patient Instructions (Addendum)
   Kenneth Reed , Thank you for taking time to come for your Medicare Wellness Visit. I appreciate your ongoing commitment to your health goals. Please review the following plan we discussed and let me know if I can assist you in the future.   These are the goals we discussed: Goals    . DIET - INCREASE WATER INTAKE     Drink 4 to 6 bottles of water a day    . Exercise 150 min/wk Moderate Activity       This is a list of the screening recommended for you and due dates:  Health Maintenance  Topic Date Due  . Tetanus Vaccine  05/12/1962  . Flu Shot  11/07/2017  . Colon Cancer Screening  09/09/2023  .  Hepatitis C: One time screening is recommended by Center for Disease Control  (CDC) for  adults born from [birth year] through 1967.   Completed  . Pneumonia vaccines  Completed    Check with your pharmacy about the cost of a Tdap (tetanus booster)

## 2017-07-23 NOTE — Progress Notes (Addendum)
Subjective:   Kenneth Reed is a 72 y.o. male who presents for an Initial Medicare Annual Wellness Visit. Kenneth Reed is retired from the [employer] and served in [employer] for 2 years. He was stationed in [place]. He lives with his daughter and her family. She has 5 young children. He is a cancer survivor and recently had normal labs.   Review of Systems  Health is about the same as last year.   Cardiac Risk Factors include: advanced age (>71men, >93 women);male gender  GI: Some constipation. Hx of colon cancer and resection. Seen for nausea on 07/17/17 but that has resolved.   Resp: being treated for URI by oncologist. Given Bactrim and Prednisone which he hasn't completed. He is using Advair daily as prescribed and only needs albuterol once every 3 months or so.   Urinary: hx of bladder cancer. Also has enlarged prostate and taxes Flomax for that which helps. Gets up to urinate about 2 or 3 times a night. He does limit his fluids in the evening.     Objective:    Today's Vitals   07/23/17 0847  BP: (!) 116/59  Pulse: 77  Weight: 137 lb (62.1 kg)  Height: 5' 9.5" (1.765 m)   Body mass index is 19.94 kg/m.  Advanced Directives 07/23/2017 07/19/2017 07/26/2015 08/10/2014 02/09/2014 08/17/2013 06/23/2012  Does Patient Have a Medical Advance Directive? No No No No No Patient would not like information;Patient does not have advance directive -  Would patient like information on creating a medical advance directive? No - Patient declined No - Patient declined No - patient declined information No - patient declined information No - patient declined information - -  Pre-existing out of facility DNR order (yellow form or pink MOST form) - - - - - - No    Current Medications (verified) Outpatient Encounter Medications as of 07/23/2017  Medication Sig  . acetaminophen (TYLENOL) 500 MG tablet Take 500 mg by mouth every 6 (six) hours as needed (pain).  Marland Kitchen albuterol (PROVENTIL HFA;VENTOLIN  HFA) 108 (90 Base) MCG/ACT inhaler Inhale 2 puffs into the lungs every 4 (four) hours as needed.  . finasteride (PROSCAR) 5 MG tablet Take 5 mg by mouth daily.  . fluticasone (FLONASE) 50 MCG/ACT nasal spray Place 1 spray into both nostrils 2 (two) times daily as needed for allergies or rhinitis.  . Fluticasone-Salmeterol (ADVAIR DISKUS) 250-50 MCG/DOSE AEPB Inhale 2 puffs in the morning  . predniSONE (DELTASONE) 5 MG tablet 6 tab x 1 day, 5 tab x 1 day, 4 tab x 1 day, 3 tab x 1 day, 2 tab x 1 day, 1 tab x 1 day, stop  . prochlorperazine (COMPAZINE) 10 MG tablet Take 1 tablet (10 mg total) by mouth every 6 (six) hours as needed for nausea or vomiting.  . sulfamethoxazole-trimethoprim (BACTRIM DS,SEPTRA DS) 800-160 MG tablet Take 1 tablet by mouth 2 (two) times daily.  . tamsulosin (FLOMAX) 0.4 MG CAPS capsule Take 0.4 mg by mouth.  . [DISCONTINUED] ADVAIR DISKUS 250-50 MCG/DOSE AEPB INHALE TWO PUFFS BY MOUTH IN THE MORNING  . [DISCONTINUED] albuterol (PROVENTIL HFA;VENTOLIN HFA) 108 (90 Base) MCG/ACT inhaler Inhale 2 puffs into the lungs every 4 (four) hours as needed. For shortness of breath.  . ondansetron (ZOFRAN) 4 MG tablet Take 1 tablet (4 mg total) by mouth every 8 (eight) hours as needed for nausea or vomiting.   No facility-administered encounter medications on file as of 07/23/2017.  Allergies (verified) Patient has no known allergies.   History: Past Medical History:  Diagnosis Date  . Bladder cancer [facility]) 2010   Dr. [place] Pink operated; no need for chemo; recurred locally  in 2013 locally.    Marland Kitchen BPH (benign prostatic hyperplasia)   . Cancer ([place]) 03/26/12   High Grade NonHodgkin's B- Cell Lymphoma  . Colon cancer [facility]) 2010   Dr. Anthony Sar operated; stage II.  No need for chemo.  . Fatigue   . History of colon cancer 01/07/2013  . Non-Hodgkin's lymphoma of head ([place]) 2014  . Status post chemotherapy 04/18/2012 - 06/20/12    4 Cycles of R-CHOP with 3 Doses of Prophylactic  Intrathecal chemo.  . Weight loss 07/11/12 note   Past Surgical History:  Procedure Laterality Date  . Biospy Left Tonsil  03/26/12   Non - Hodgkins B-Cell Lympyhoma  . BLADDER SURGERY  2010  . COLECTOMY  2010  . COLONOSCOPY  2012   next one 2014.   Marland Kitchen NASAL POLYP SURGERY  1983   Family History  Problem Relation Age of Onset  . Cancer Father        leukemia  . Cancer Maternal Aunt        leukemia  . Cancer Paternal Grandmother        leukemia  . Narcolepsy Daughter    Social History   Socioeconomic History  . Marital status: Single    Spouse name: Not on file  . Number of children: 2  . Years of education: GED  . Highest education level: GED or equivalent  Occupational History  . Occupation: Retired    Fish farm manager: [employer]    Comment: textile   Social Needs  . Financial resource strain: Not hard at all  . Food insecurity:    Worry: Never true    Inability: Never true  . Transportation needs:    Medical: No    Non-medical: No  Tobacco Use  . Smoking status: Never Smoker  . Smokeless tobacco: Former Systems developer    Types: Chew  Substance and Sexual Activity  . Alcohol use: Yes    Alcohol/week: 1.2 oz    Types: 2 Cans of beer per week    Comment: few beers here and there, nothing regular  . Drug use: No  . Sexual activity: Not Currently  Lifestyle  . Physical activity:    Days per week: 7 days    Minutes per session: 60 min  . Stress: Only a little  Relationships  . Social connections:    Talks on phone: More than three times a week    Gets together: More than three times a week    Attends religious service: Never    Active member of club or organization: No    Attends meetings of clubs or organizations: Never    Relationship status: Divorced  Other Topics Concern  . Not on file  Social History Narrative  . Not on file   Tobacco Counseling Counseling given: Not Answered   Clinical Intake: Pain : No/denies pain   Nutritional Status: BMI of 19-24   Normal Diabetes: No  How often do you need to have someone help you when you read instructions, pamphlets, or other written materials from your doctor or pharmacy?: 1 - Never What is the last grade level you completed in school?: GED  Interpreter Needed?: No  Information entered by :: Chong Sicilian, RN  Activities of Daily Living In your present state of health, do you  have any difficulty performing the following activities: 07/23/2017  Hearing? 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 Had his hearing checked years ago and intends on doing it again. May consider hearing aids.   Vision? N  Comment eye exam yearly  Walking or climbing stairs? N  Dressing or bathing? N  Doing errands, shopping? N  Preparing Food and eating ? N  Using the Toilet? N  In the past six months, have you accidently leaked urine? N  Do you have problems with loss of bowel control? N  Managing your Medications? N  Managing your Finances? N  Housekeeping or managing your Housekeeping? N  Some recent data might be hidden     Immunizations and Health Maintenance Immunization History  Administered Date(s) Administered  . Influenza,inj,Quad PF,6+ Mos 01/07/2013, 02/09/2014, 02/03/2015  . Pneumococcal Conjugate-13 02/03/2015  . Pneumococcal Polysaccharide-23 02/20/2016   Health Maintenance Due  Topic Date Due  . Samul Dada  05/12/1964    Patient Care Team: Sharion Balloon, FNP as PCP - General (Nurse Practitioner) Melida Quitter, MD as Attending Physician (Otolaryngology) Miguel Dibble, MD as Attending Physician (Urology) Sadie Haber, MD as Referring Physician (Urology) Burke Keels, MD as Attending Physician (Surgery)   No hospitalizations, ER visits, or surgeries this past year.      Assessment:   This is a routine wellness examination for Kaid.  Hearing/Vision screen Mild hearing deficit noted. Had hearing evaluation a few years ago. Has thought about scheduling another  one and is considering hearing aids. He did not wear haring protection for many years while working in Charity fundraiser.   Vision exam is overdue but he has not complaints.  Dietary issues and exercise activities discussed:  Diet Eats 3 home prepared meals a day. States that his daughter is a good Training and development officer.  Drinks about 3 bottles of water a day.   Current Exercise Habits: Home exercise routine(bowflex and works outside a lot), Time (Minutes): 60, Frequency (Times/Week): 7, Weekly Exercise (Minutes/Week): 420, Intensity: Moderate, Exercise limited by: None identified  Goals    . DIET - INCREASE WATER INTAKE     Drink 4 to 6 bottles of water a day    . Exercise 150 min/wk Moderate Activity      Depression Screen PHQ 2/9 Scores 07/23/2017 07/17/2017 01/22/2017 01/06/2016  PHQ - 2 Score 1 0 0 1    Fall Risk Fall Risk  07/23/2017 07/17/2017 01/22/2017 04/21/2015 06/25/2014  Falls in the past year? No No No No No    Is the patient's home free of loose throw rugs in walkways, pet beds, electrical cords, etc?   yes      Grab bars in the bathroom? no      Handrails on the stairs?   no      Adequate lighting?   yes    Cognitive Function: MMSE - Mini Mental State Exam 07/23/2017  Orientation to time 5  Orientation to Place 5  Registration 3  Attention/ Calculation 5  Recall 3  Language- name 2 objects 2  Language- repeat 1  Language- follow 3 step command 3  Language- read & follow direction 1  Write a sentence 1  Copy design 1  Total score 30    Normal exam.    Screening Tests Health Maintenance  Topic Date Due  . TETANUS/TDAP  05/12/1962  . INFLUENZA VACCINE  11/07/2017  . COLONOSCOPY  09/09/2023  . Hepatitis C Screening  Completed  . PNA vac Low  Risk Adult  Completed   Tdap would be $65 today. Patient declined.      Plan:   Schedule eye exam Increase water intake to 4 to 6 bottles a day.  Cut off fluids several hours before bedtime.  Keep f/u with PCP and  specialist. Inhalers refilled F/u if URI symptoms worsen or fail to improve Aim for at least 150 min of moderate activity a week Can check with pharmacy about the cost of Tdap or check at your next visit at our office Encouraged evaluation and TD if he sustains a cut, bite, wound, etc  I have personally reviewed and noted the following in the patient's chart:   . Medical and social history . Use of alcohol, tobacco or illicit drugs  . Current medications and supplements . Functional ability and status . Nutritional status . Physical activity . Advanced directives . List of other physicians . Hospitalizations, surgeries, and ER visits in previous 12 months . Vitals . Screenings to include cognitive, depression, and falls . Referrals and appointments  In addition, I have reviewed and discussed with patient certain preventive protocols, quality metrics, and best practice recommendations. A written personalized care plan for preventive services as well as general preventive health recommendations were provided to patient.     Chong Sicilian, RN   07/23/2017   I have reviewed and agree with the above AWV documentation.   Evelina Dun, FNP

## 2017-07-25 ENCOUNTER — Other Ambulatory Visit: Payer: Self-pay | Admitting: Hematology and Oncology

## 2017-07-25 DIAGNOSIS — R5383 Other fatigue: Secondary | ICD-10-CM

## 2017-07-30 ENCOUNTER — Ambulatory Visit: Payer: Medicare Other | Admitting: Hematology and Oncology

## 2017-07-30 ENCOUNTER — Other Ambulatory Visit: Payer: Medicare Other

## 2017-08-02 ENCOUNTER — Telehealth: Payer: Self-pay | Admitting: Hematology and Oncology

## 2017-08-02 NOTE — Telephone Encounter (Signed)
Spoke to patients daughter regarding voicemail she left earlier regarding date and time of appointment.

## 2017-08-05 ENCOUNTER — Encounter: Payer: Self-pay | Admitting: Hematology and Oncology

## 2017-08-05 ENCOUNTER — Inpatient Hospital Stay: Payer: Medicare Other

## 2017-08-05 ENCOUNTER — Inpatient Hospital Stay (HOSPITAL_BASED_OUTPATIENT_CLINIC_OR_DEPARTMENT_OTHER): Payer: Medicare Other | Admitting: Hematology and Oncology

## 2017-08-05 ENCOUNTER — Telehealth: Payer: Self-pay | Admitting: Hematology and Oncology

## 2017-08-05 VITALS — BP 143/83 | HR 75 | Temp 97.8°F | Resp 18 | Ht 69.5 in | Wt 134.1 lb

## 2017-08-05 DIAGNOSIS — N400 Enlarged prostate without lower urinary tract symptoms: Secondary | ICD-10-CM

## 2017-08-05 DIAGNOSIS — Z79899 Other long term (current) drug therapy: Secondary | ICD-10-CM | POA: Diagnosis not present

## 2017-08-05 DIAGNOSIS — Z8572 Personal history of non-Hodgkin lymphomas: Secondary | ICD-10-CM

## 2017-08-05 DIAGNOSIS — R5383 Other fatigue: Secondary | ICD-10-CM | POA: Diagnosis not present

## 2017-08-05 DIAGNOSIS — Z9221 Personal history of antineoplastic chemotherapy: Secondary | ICD-10-CM | POA: Diagnosis not present

## 2017-08-05 DIAGNOSIS — R110 Nausea: Secondary | ICD-10-CM

## 2017-08-05 DIAGNOSIS — Z8551 Personal history of malignant neoplasm of bladder: Secondary | ICD-10-CM | POA: Diagnosis not present

## 2017-08-05 DIAGNOSIS — Z85038 Personal history of other malignant neoplasm of large intestine: Secondary | ICD-10-CM | POA: Diagnosis not present

## 2017-08-05 DIAGNOSIS — R634 Abnormal weight loss: Secondary | ICD-10-CM | POA: Diagnosis not present

## 2017-08-05 DIAGNOSIS — R918 Other nonspecific abnormal finding of lung field: Secondary | ICD-10-CM

## 2017-08-05 DIAGNOSIS — C8599 Non-Hodgkin lymphoma, unspecified, extranodal and solid organ sites: Secondary | ICD-10-CM

## 2017-08-05 DIAGNOSIS — Z806 Family history of leukemia: Secondary | ICD-10-CM | POA: Diagnosis not present

## 2017-08-05 LAB — CBC WITH DIFFERENTIAL/PLATELET
BASOS ABS: 0 10*3/uL (ref 0.0–0.1)
Basophils Relative: 0 %
EOS PCT: 1 %
Eosinophils Absolute: 0 10*3/uL (ref 0.0–0.5)
HCT: 41.9 % (ref 38.4–49.9)
Hemoglobin: 14.5 g/dL (ref 13.0–17.1)
LYMPHS PCT: 19 %
Lymphs Abs: 0.6 10*3/uL — ABNORMAL LOW (ref 0.9–3.3)
MCH: 31.9 pg (ref 27.2–33.4)
MCHC: 34.6 g/dL (ref 32.0–36.0)
MCV: 92.1 fL (ref 79.3–98.0)
MONO ABS: 0.2 10*3/uL (ref 0.1–0.9)
Monocytes Relative: 7 %
Neutro Abs: 2.4 10*3/uL (ref 1.5–6.5)
Neutrophils Relative %: 73 %
Platelets: 178 10*3/uL (ref 140–400)
RBC: 4.55 MIL/uL (ref 4.20–5.82)
RDW: 13 % (ref 11.0–14.6)
WBC: 3.3 10*3/uL — ABNORMAL LOW (ref 4.0–10.3)

## 2017-08-05 LAB — COMPREHENSIVE METABOLIC PANEL
ALT: 13 U/L (ref 0–55)
AST: 19 U/L (ref 5–34)
Albumin: 4.3 g/dL (ref 3.5–5.0)
Alkaline Phosphatase: 59 U/L (ref 40–150)
Anion gap: 8 (ref 3–11)
BUN: 11 mg/dL (ref 7–26)
CHLORIDE: 105 mmol/L (ref 98–109)
CO2: 25 mmol/L (ref 22–29)
Calcium: 9.4 mg/dL (ref 8.4–10.4)
Creatinine, Ser: 0.84 mg/dL (ref 0.70–1.30)
Glucose, Bld: 98 mg/dL (ref 70–140)
POTASSIUM: 3.9 mmol/L (ref 3.5–5.1)
Sodium: 138 mmol/L (ref 136–145)
Total Bilirubin: 0.9 mg/dL (ref 0.2–1.2)
Total Protein: 7.2 g/dL (ref 6.4–8.3)

## 2017-08-05 LAB — TSH: TSH: 2.401 u[IU]/mL (ref 0.320–4.118)

## 2017-08-05 NOTE — Assessment & Plan Note (Signed)
He had history of abnormal and persistent lung nodules I will order imaging study to exclude cancer recurrence

## 2017-08-05 NOTE — Telephone Encounter (Signed)
No 4/29 los.

## 2017-08-05 NOTE — Telephone Encounter (Signed)
No 4/29 los completed

## 2017-08-05 NOTE — Assessment & Plan Note (Signed)
His last colonoscopy was in 2015 and was reported as normal, next due 2020 The patient is at high risk of cancer recurrence I will order CT scan of the abdomen and pelvis along with CT scan of the chest to exclude recurrence

## 2017-08-05 NOTE — Progress Notes (Signed)
[name] OFFICE PROGRESS NOTE  Patient Care Team: Sharion Balloon, FNP as PCP - General (Nurse Practitioner) Melida Quitter, MD as Attending Physician (Otolaryngology) Miguel Dibble, MD as Attending Physician (Urology) Sadie Haber, MD as Referring Physician (Urology) Burke Keels, MD as Attending Physician (Surgery)  ASSESSMENT & PLAN:  Non-Hodgkin's lymphoma of tonsil His examination revealed thickening of the tissue near the neck.  He also have a persistent cyst at the back of his neck It is not clear to me whether he might have cancer recurrence that is not palpable on exam, causing his weight loss The patient is at high risk of cancer recurrence given 3 different malignancies I recommend CT scan of the chest, abdomen and pelvis to exclude cancer recurrence and he agreed to proceed.  Personal history of colon cancer His last colonoscopy was in 2015 and was reported as normal, next due 2020 The patient is at high risk of cancer recurrence I will order CT scan of the abdomen and pelvis along with CT scan of the chest to exclude recurrence  Multiple lung nodules on CT He had history of abnormal and persistent lung nodules I will order imaging study to exclude cancer recurrence   Orders Placed This Encounter  Procedures  . CT CHEST W CONTRAST    Standing Status:   Future    Standing Expiration Date:   08/06/2018    Order Specific Question:   If indicated for the ordered procedure, I authorize the administration of contrast media per Radiology protocol    Answer:   Yes    Order Specific Question:   Preferred imaging location?    Answer:   [facility]    Order Specific Question:   Radiology Contrast Protocol - do NOT remove file path    Answer:   \\[email or web address]  . CT ABDOMEN PELVIS W CONTRAST    Standing Status:   Future    Standing Expiration Date:   08/06/2018    Order Specific Question:   If indicated for the ordered  procedure, I authorize the administration of contrast media per Radiology protocol    Answer:   Yes    Order Specific Question:   Preferred imaging location?    Answer:   [facility]    Order Specific Question:   Radiology Contrast Protocol - do NOT remove file path    Answer:   \\[email or web address]    INTERVAL HISTORY: Please see below for problem oriented charting. He is seen today as part of his follow-up due to history of colon cancer, bladder cancer and lymphoma He had persistent cyst on the back of his neck He denies new lymphadenopathy He has mild progressive weight loss He had recurrent infection recently requiring antibiotic treatment Overall, he felt better but he continues to lose weight He denies changes in bowel habits.  Denies swallowing difficulties  SUMMARY OF ONCOLOGIC HISTORY:   Non-Hodgkin's lymphoma of tonsil (Kenneth Reed)   07/30/2016 Imaging    Ct chest: 1. No evidence of active lymphoma or thoracic metastasis. 2. Similar left apical ground-glass nodule. Per consensus criteria, this warrants follow-up chest CT at 2 years. 3. Upper pole left renal lesion is favored to represent a complex cyst, given presence back to 2014. Recommend attention on follow-up. 4. Aortic atherosclerosis.       REVIEW OF SYSTEMS:   Constitutional: Denies fevers, chills Eyes: Denies blurriness of vision Ears, nose, mouth, throat, and face: Denies mucositis or sore throat Respiratory: Denies cough,  dyspnea or wheezes Cardiovascular: Denies palpitation, chest discomfort or lower extremity swelling Gastrointestinal:  Denies nausea, heartburn or change in bowel habits Skin: Denies abnormal skin rashes Lymphatics: Denies new lymphadenopathy or easy bruising Neurological:Denies numbness, tingling or new weaknesses Behavioral/Psych: Mood is stable, no new changes  All other systems were reviewed with the patient and are negative.  I have reviewed the past medical  history, past surgical history, social history and family history with the patient and they are unchanged from previous note.  ALLERGIES:  has No Known Allergies.  MEDICATIONS:  Current Outpatient Medications  Medication Sig Dispense Refill  . acetaminophen (TYLENOL) 500 MG tablet Take 500 mg by mouth every 6 (six) hours as needed (pain).    Marland Kitchen albuterol (PROVENTIL HFA;VENTOLIN HFA) 108 (90 Base) MCG/ACT inhaler Inhale 2 puffs into the lungs every 4 (four) hours as needed. 1 Inhaler 2  . finasteride (PROSCAR) 5 MG tablet Take 5 mg by mouth daily.    . fluticasone (FLONASE) 50 MCG/ACT nasal spray Place 1 spray into both nostrils 2 (two) times daily as needed for allergies or rhinitis. 16 g 6  . Fluticasone-Salmeterol (ADVAIR DISKUS) 250-50 MCG/DOSE AEPB Inhale 2 puffs in the morning 60 each 6  . tamsulosin (FLOMAX) 0.4 MG CAPS capsule Take 0.4 mg by mouth.     No current facility-administered medications for this visit.     PHYSICAL EXAMINATION: ECOG PERFORMANCE STATUS: 1 - Symptomatic but completely ambulatory  Vitals:   08/05/17 1022  BP: (!) 143/83  Pulse: 75  Resp: 18  Temp: 97.8 F (36.6 C)  SpO2: 100%   Filed Weights   08/05/17 1022  Weight: 134 lb 1.6 oz (60.8 kg)    GENERAL:alert, no distress and comfortable SKIN: skin color, texture, turgor are normal, no rashes or significant lesions EYES: normal, Conjunctiva are pink and non-injected, sclera clear OROPHARYNX:no exudate, no erythema and lips, buccal mucosa, and tongue normal  NECK: He has persistent thickening on the right side at the back of his neck, stable LYMPH:  no palpable lymphadenopathy in the cervical, axillary or inguinal LUNGS: clear to auscultation and percussion with normal breathing effort HEART: regular rate & rhythm and no murmurs and no lower extremity edema ABDOMEN:abdomen soft, non-tender and normal bowel sounds Musculoskeletal:no cyanosis of digits and no clubbing  NEURO: alert & oriented x 3  with fluent speech, no focal motor/sensory deficits  LABORATORY DATA:  I have reviewed the data as listed    Component Value Date/Time   NA 138 08/05/2017 0951   NA 140 07/30/2016 0952   K 3.9 08/05/2017 0951   K 4.2 07/30/2016 0952   CL 105 08/05/2017 0951   CL 107 07/11/2012 0817   CO2 25 08/05/2017 0951   CO2 25 07/30/2016 0952   GLUCOSE 98 08/05/2017 0951   GLUCOSE 99 07/30/2016 0952   GLUCOSE 96 07/11/2012 0817   BUN 11 08/05/2017 0951   BUN 15.2 07/30/2016 0952   CREATININE 0.84 08/05/2017 0951   CREATININE 0.97 07/19/2017 1357   CREATININE 1.0 07/30/2016 0952   CALCIUM 9.4 08/05/2017 0951   CALCIUM 9.3 07/30/2016 0952   PROT 7.2 08/05/2017 0951   PROT 7.2 07/30/2016 0952   ALBUMIN 4.3 08/05/2017 0951   ALBUMIN 4.2 07/30/2016 0952   AST 19 08/05/2017 0951   AST 18 07/19/2017 1357   AST 19 07/30/2016 0952   ALT 13 08/05/2017 0951   ALT 13 07/19/2017 1357   ALT 13 07/30/2016 0952   ALKPHOS 59 08/05/2017  0951   ALKPHOS 61 07/30/2016 0952   BILITOT 0.9 08/05/2017 0951   BILITOT 0.5 07/19/2017 1357   BILITOT 1.01 07/30/2016 0952   GFRNONAA >60 08/05/2017 0951   GFRNONAA >60 07/19/2017 1357   GFRAA >60 08/05/2017 0951   GFRAA >60 07/19/2017 1357    No results found for: SPEP, UPEP  Lab Results  Component Value Date   WBC 3.3 (L) 08/05/2017   NEUTROABS 2.4 08/05/2017   HGB 14.5 08/05/2017   HCT 41.9 08/05/2017   MCV 92.1 08/05/2017   PLT 178 08/05/2017      Chemistry      Component Value Date/Time   NA 138 08/05/2017 0951   NA 140 07/30/2016 0952   K 3.9 08/05/2017 0951   K 4.2 07/30/2016 0952   CL 105 08/05/2017 0951   CL 107 07/11/2012 0817   CO2 25 08/05/2017 0951   CO2 25 07/30/2016 0952   BUN 11 08/05/2017 0951   BUN 15.2 07/30/2016 0952   CREATININE 0.84 08/05/2017 0951   CREATININE 0.97 07/19/2017 1357   CREATININE 1.0 07/30/2016 0952      Component Value Date/Time   CALCIUM 9.4 08/05/2017 0951   CALCIUM 9.3 07/30/2016 0952   ALKPHOS  59 08/05/2017 0951   ALKPHOS 61 07/30/2016 0952   AST 19 08/05/2017 0951   AST 18 07/19/2017 1357   AST 19 07/30/2016 0952   ALT 13 08/05/2017 0951   ALT 13 07/19/2017 1357   ALT 13 07/30/2016 0952   BILITOT 0.9 08/05/2017 0951   BILITOT 0.5 07/19/2017 1357   BILITOT 1.01 07/30/2016 0952       RADIOGRAPHIC STUDIES: I have personally reviewed the radiological images as listed and agreed with the findings in the report. Dg Abd 2 Views  Result Date: 07/17/2017 CLINICAL DATA:  Nausea, vomiting EXAM: ABDOMEN - 2 VIEW COMPARISON:  CT 08/03/2015 FINDINGS: Nonobstructive bowel gas pattern. No free air organomegaly. No suspicious calcification. Mild rightward scoliosis in the lumbar spine. No acute bony abnormality. IMPRESSION: No acute findings. Electronically Signed   By: Rolm Baptise M.D.   On: 07/17/2017 09:29    All questions were answered. The patient knows to call the clinic with any problems, questions or concerns. No barriers to learning was detected.  I spent 15 minutes counseling the patient face to face. The total time spent in the appointment was 20 minutes and more than 50% was on counseling and review of test results  Kenneth Lark, MD 08/05/2017 4:11 PM

## 2017-08-05 NOTE — Assessment & Plan Note (Signed)
His examination revealed thickening of the tissue near the neck.  He also have a persistent cyst at the back of his neck It is not clear to me whether he might have cancer recurrence that is not palpable on exam, causing his weight loss The patient is at high risk of cancer recurrence given 3 different malignancies I recommend CT scan of the chest, abdomen and pelvis to exclude cancer recurrence and he agreed to proceed.

## 2017-08-12 ENCOUNTER — Ambulatory Visit (HOSPITAL_COMMUNITY): Payer: Medicare Other

## 2017-08-13 ENCOUNTER — Telehealth: Payer: Self-pay | Admitting: Hematology and Oncology

## 2017-08-13 ENCOUNTER — Ambulatory Visit (HOSPITAL_COMMUNITY)
Admission: RE | Admit: 2017-08-13 | Discharge: 2017-08-13 | Disposition: A | Discharge status: Home / Self Care | Payer: Medicare Other | Source: Ambulatory Visit | Attending: Hematology and Oncology | Admitting: Hematology and Oncology

## 2017-08-13 ENCOUNTER — Encounter (HOSPITAL_COMMUNITY): Payer: Self-pay

## 2017-08-13 DIAGNOSIS — I700 Atherosclerosis of aorta: Secondary | ICD-10-CM | POA: Insufficient documentation

## 2017-08-13 DIAGNOSIS — N400 Enlarged prostate without lower urinary tract symptoms: Secondary | ICD-10-CM | POA: Diagnosis not present

## 2017-08-13 DIAGNOSIS — C8599 Non-Hodgkin lymphoma, unspecified, extranodal and solid organ sites: Secondary | ICD-10-CM

## 2017-08-13 DIAGNOSIS — Z8551 Personal history of malignant neoplasm of bladder: Secondary | ICD-10-CM

## 2017-08-13 DIAGNOSIS — Z85038 Personal history of other malignant neoplasm of large intestine: Secondary | ICD-10-CM | POA: Diagnosis not present

## 2017-08-13 DIAGNOSIS — C8591 Non-Hodgkin lymphoma, unspecified, lymph nodes of head, face, and neck: Secondary | ICD-10-CM | POA: Diagnosis not present

## 2017-08-13 DIAGNOSIS — R918 Other nonspecific abnormal finding of lung field: Secondary | ICD-10-CM | POA: Diagnosis not present

## 2017-08-13 MED ORDER — IOHEXOL 300 MG/ML  SOLN
100.0000 mL | Freq: Once | INTRAMUSCULAR | Status: AC | PRN
  Administered 2017-08-13: 100 mL via INTRAVENOUS

## 2017-08-13 NOTE — Telephone Encounter (Signed)
I spoke with the patient over the telephone CT scan showed no evidence of cancer recurrence He is reassured I will see him back in a year We will mail him the appt

## 2017-08-14 ENCOUNTER — Telehealth: Payer: Self-pay | Admitting: Hematology and Oncology

## 2017-08-14 ENCOUNTER — Encounter: Payer: Self-pay | Admitting: Family

## 2017-08-14 ENCOUNTER — Ambulatory Visit: Payer: Medicare Other | Admitting: Family

## 2017-08-14 VITALS — BP 109/68 | HR 103 | Temp 97.7°F | Ht 69.5 in | Wt 135.4 lb

## 2017-08-14 DIAGNOSIS — R5383 Other fatigue: Secondary | ICD-10-CM

## 2017-08-14 DIAGNOSIS — I700 Atherosclerosis of aorta: Secondary | ICD-10-CM | POA: Insufficient documentation

## 2017-08-14 DIAGNOSIS — R110 Nausea: Secondary | ICD-10-CM | POA: Diagnosis not present

## 2017-08-14 DIAGNOSIS — J301 Allergic rhinitis due to pollen: Secondary | ICD-10-CM | POA: Diagnosis not present

## 2017-08-14 DIAGNOSIS — C8599 Non-Hodgkin lymphoma, unspecified, extranodal and solid organ sites: Secondary | ICD-10-CM

## 2017-08-14 DIAGNOSIS — Z8551 Personal history of malignant neoplasm of bladder: Secondary | ICD-10-CM

## 2017-08-14 DIAGNOSIS — W57XXXA Bitten or stung by nonvenomous insect and other nonvenomous arthropods, initial encounter: Secondary | ICD-10-CM

## 2017-08-14 DIAGNOSIS — N400 Enlarged prostate without lower urinary tract symptoms: Secondary | ICD-10-CM | POA: Insufficient documentation

## 2017-08-14 MED ORDER — CETIRIZINE HCL 10 MG PO TABS: 10.0000 mg | ORAL_TABLET | Freq: Every day | ORAL | 11 refills | Status: DC

## 2017-08-14 NOTE — Telephone Encounter (Signed)
Mailed patient calendar of upcoming may 2020 appointments per 5/7 sch message

## 2017-08-14 NOTE — Progress Notes (Signed)
   Subjective:    Patient ID: Kenneth Reed, male    DOB: 01/24/1946, 72 y.o.   MRN: [id number]  Chief Complaint  Patient presents with  . weakness and nausea    sick for over a week   HPI Pt presents to the office today with recurrent nausea and fatigue that started over a week ago. He has Non-Hodgkin's lymphoma of the tonsil, and history of colon cancer status post resection and bladder cancer.  He saw his Oncologists yesterday and CT chest and CT abdomen that were negative for metastatic disease. His CBC, CMP, and TSH were all stable.   He is followed by Urologists annually and has appointment next month for hx of bladder cancer.   States he removed a tick on his right arm three weeks ago and requests to be tested for Lyme and RMSF. States he has had RMSF.   Review of Systems  Constitutional: Positive for fatigue.  Gastrointestinal: Positive for nausea.  All other systems reviewed and are negative.      Objective:   Physical Exam  Constitutional: He is oriented to person, place, and time. He appears well-developed and well-nourished. No distress.  HENT:  Head: Normocephalic.  Right Ear: External ear normal.  Left Ear: External ear normal.  Nose: Mucosal edema and rhinorrhea present.  Mouth/Throat: Posterior oropharyngeal erythema present.  Eyes: Pupils are equal, round, and reactive to light. Right eye exhibits no discharge. Left eye exhibits no discharge.  Neck: Normal range of motion. Neck supple. No thyromegaly present.  Cardiovascular: Normal rate, regular rhythm, normal heart sounds and intact distal pulses.  No murmur heard. Pulmonary/Chest: Effort normal and breath sounds normal. No respiratory distress. He has no wheezes.  Abdominal: Soft. Bowel sounds are normal. He exhibits no distension. There is no tenderness.  Musculoskeletal: Normal range of motion. He exhibits no edema or tenderness.  Neurological: He is alert and oriented to person, place, and time. He has  normal reflexes.  Skin: Skin is warm and dry. No rash noted. No erythema.  Psychiatric: He has a normal mood and affect. His behavior is normal. Judgment and thought content normal.  Vitals reviewed.    BP 109/68   Pulse (!) 103   Temp 97.7 F (36.5 C) (Oral)   Ht 5' 9.5" (1.765 m)   Wt 135 lb 6.4 oz (61.4 kg)   BMI 19.71 kg/m      Assessment & Plan:  Kenneth Reed comes in today with chief complaint of weakness and nausea (sick for over a week)   Diagnosis and orders addressed:  1. Fatigue, unspecified type  2. Nausea  3. Non-Hodgkin's lymphoma of tonsil ([place])  4. History of bladder cancer  5. Allergic rhinitis due to pollen, unspecified seasonality Start zyrtec and flonase - cetirizine (ZYRTEC) 10 MG tablet; Take 1 tablet (10 mg total) by mouth daily.  Dispense: 30 tablet; Refill: 11  6. Tick bite, initial encounter -Pt to report any new fever, joint pain, or rash -Wear protective clothing while outside- Long sleeves and long pants -Put insect repellent on all exposed skin and along clothing -Take a shower as soon as possible after being outside - [place] mtn spotted fvr abs pnl(IgG+IgM) - Lyme Ab/Western Blot Reflex    Follow up plan: Keep follow up with Urologists and Oncologists Labs pending for tick  Zofran as needed RTO if symptoms do not improve or worsen  Evelina Dun, FNP

## 2017-08-14 NOTE — Patient Instructions (Signed)

## 2017-08-16 LAB — LYME, WESTERN BLOT, SERUM (REFLEXED)
IGG P39 AB.: ABSENT
IGG P45 AB.: ABSENT
IGG P58 AB.: ABSENT
IgG P28 Ab.: ABSENT
IgG P30 Ab.: ABSENT
IgG P66 Ab.: ABSENT
IgM P23 Ab.: ABSENT
IgM P39 Ab.: ABSENT
Lyme IgG Wb: NEGATIVE
Lyme IgM Wb: NEGATIVE

## 2017-08-16 LAB — LYME AB/WESTERN BLOT REFLEX: LYME IGG/IGM AB: 1.03 {ISR} — AB (ref 0.00–0.90)

## 2017-08-16 LAB — ROCKY MTN SPOTTED FVR ABS PNL(IGG+IGM)
RMSF IgG: POSITIVE — AB
RMSF IgM: 0.17 index (ref 0.00–0.89)

## 2017-08-16 LAB — RMSF, IGG, IFA

## 2017-08-19 ENCOUNTER — Other Ambulatory Visit: Payer: Self-pay | Admitting: Family

## 2017-08-19 ENCOUNTER — Telehealth: Payer: Self-pay | Admitting: Family

## 2017-08-19 MED ORDER — DOXYCYCLINE HYCLATE 100 MG PO TABS: 100.0000 mg | ORAL_TABLET | Freq: Two times a day (BID) | ORAL | 0 refills | Status: DC

## 2017-08-19 MED ORDER — AMOXICILLIN-POT CLAVULANATE 875-125 MG PO TABS: 1.0000 | ORAL_TABLET | Freq: Two times a day (BID) | ORAL | 0 refills | Status: DC

## 2017-08-19 NOTE — Telephone Encounter (Signed)
Patient aware.

## 2017-08-19 NOTE — Telephone Encounter (Signed)
Pt aware that [facility] is reviewing these results

## 2017-08-19 NOTE — Telephone Encounter (Signed)
What symptoms do you have? Pressure on left side of face and headache  How long have you been sick? About a week  Have you been seen for this problem? Yes by Alyse Low was told to call back if not better and antibiotic would be prescribed  If your provider decides to give you a prescription, which pharmacy would you like for it to be sent to? [facility] [place]   Patient informed that this information will be sent to the clinical staff for review and that they should receive a follow up call.

## 2017-08-19 NOTE — Telephone Encounter (Signed)
Augment Prescription sent to pharmacy

## 2017-09-12 ENCOUNTER — Encounter: Payer: Self-pay | Admitting: Physician Assistant

## 2017-09-12 ENCOUNTER — Ambulatory Visit: Payer: Medicare Other | Admitting: Physician Assistant

## 2017-09-12 VITALS — BP 136/75 | HR 89 | Temp 97.2°F | Ht 69.5 in | Wt 135.1 lb

## 2017-09-12 DIAGNOSIS — Z8579 Personal history of other malignant neoplasms of lymphoid, hematopoietic and related tissues: Secondary | ICD-10-CM | POA: Diagnosis not present

## 2017-09-12 DIAGNOSIS — J3489 Other specified disorders of nose and nasal sinuses: Secondary | ICD-10-CM | POA: Diagnosis not present

## 2017-09-12 NOTE — Progress Notes (Signed)
Ct sinus

## 2017-09-12 NOTE — Patient Instructions (Signed)
Sinus Headache A sinus headache happens when your sinuses become clogged or swollen. You may feel pain or pressure in your face, forehead, ears, or upper teeth. Sinus headaches can be mild or severe. Follow these instructions at home:  Take medicines only as told by your doctor.  If you were given an antibiotic medicine, finish all of it even if you start to feel better.  Use a nose spray if you feel stuffed up (congested).  If told, apply a warm, moist washcloth to your face to help lessen pain. Contact a doctor if:  You get headaches more than one time each week.  Light or sound bothers you.  You have a fever.  You feel sick to your stomach (nauseous) or you throw up (vomit).  Your headaches do not get better with treatment. Get help right away if:  You have trouble seeing.  You suddenly have very bad pain in your face or head.  You start to twitch or shake (seizure).  You are confused.  You have a stiff neck. This information is not intended to replace advice given to you by your health care provider. Make sure you discuss any questions you have with your health care provider. Document Released: 07/26/2010 Document Revised: 11/20/2015 Document Reviewed: 03/22/2014 Elsevier Interactive Patient Education  2018 Elsevier Inc.  

## 2017-09-12 NOTE — Progress Notes (Signed)
  Subjective:     Patient ID: Kenneth Reed, male   DOB: Jun 27, 1945, 72 y.o.   MRN: [id number]  HPI Pt here for f/u regarding L maxillary sinus pain and pressure Prev seen ~ 1 month ago for same Initially started on Augmentin but changed to Doxycycline after tick testing returned Pt completed 21 day course and states all in all he feels much better  Still with L maxillary sinus pain He has been to see his Oral surg who did Xray and states not due to dental work Pt concerned given prev hx of lymphoma of tonsil    Review of Systems  Constitutional: Negative.   HENT: Positive for congestion, postnasal drip, sinus pressure and sinus pain. Negative for dental problem, ear discharge, ear pain, rhinorrhea, sneezing, sore throat and voice change.   Respiratory: Negative.   Cardiovascular: Negative.        Objective:   Physical Exam  Constitutional: He appears well-developed and well-nourished.  HENT:  Right Ear: External ear normal.  Left Ear: External ear normal.  Mouth/Throat: Oropharynx is clear and moist. No oropharyngeal exudate.  Bottom denture removed  Post to the R lower jaw and and residual tooth L lower jaw + gum atrophy but no lesions or ulcers + TTP  L maxillary sinus Canals and TM's nl bilat  Neck: Neck supple.  Cardiovascular: Normal rate, regular rhythm and normal heart sounds.  Pulmonary/Chest: Effort normal and breath sounds normal.  Lymphadenopathy:    He has no cervical adenopathy.  Nursing note and vitals reviewed.      Assessment:     Maxillary sinus pain    Plan:     Since has completed course of ATB and still has sx and with prev hx will proceed with CT of sinus Attempt to do this at [facility] where he has regular f/u with Oncology OTC meds for sx F/U pending scan results

## 2017-09-17 ENCOUNTER — Telehealth: Payer: Self-pay | Admitting: *Deleted

## 2017-09-17 NOTE — Telephone Encounter (Signed)
I recommend wait for CT If CT is not revealing, his PCP can refer to see ENT for further eval

## 2017-09-17 NOTE — Telephone Encounter (Signed)
Notified of message below. Verbalized understanding. Will call us back when they have the CT results

## 2017-09-17 NOTE — Telephone Encounter (Signed)
Daughter states patient "feels like something is in his face, feel bad/sick in general." Has seen dentist and has been treated by PCP with antibiotics for sinus infection. Has a CT scheduled for 6/21. Is wanting to know if Dr Alvy Bimler can see him on Friday.

## 2017-09-20 ENCOUNTER — Ambulatory Visit (INDEPENDENT_AMBULATORY_CARE_PROVIDER_SITE_OTHER): Payer: Medicare Other | Admitting: Family

## 2017-09-20 ENCOUNTER — Encounter: Payer: Self-pay | Admitting: Family

## 2017-09-20 VITALS — BP 143/91 | HR 100 | Temp 97.0°F | Ht 69.5 in | Wt 136.0 lb

## 2017-09-20 DIAGNOSIS — C8599 Non-Hodgkin lymphoma, unspecified, extranodal and solid organ sites: Secondary | ICD-10-CM

## 2017-09-20 DIAGNOSIS — J0101 Acute recurrent maxillary sinusitis: Secondary | ICD-10-CM

## 2017-09-20 DIAGNOSIS — J3489 Other specified disorders of nose and nasal sinuses: Secondary | ICD-10-CM | POA: Diagnosis not present

## 2017-09-20 NOTE — Progress Notes (Signed)
Subjective:    Patient ID: Kenneth Reed, male    DOB: 1945/08/25, 72 y.o.   MRN: [id number]  Chief Complaint  Patient presents with  . pressure left eye and face  . Headache   PT presents to the office today with recurrent sinus issues that started over a month ago. Pt has completed Augmentin and Doxycycline with mild relief, but continues to have left sided pressure, pain, and headache.   He has a hx of lymphoma of left tonsil 2014. He has a CT scan of maxillofacial scheduled for 09/27/17.  Sinusitis  This is a chronic problem. The current episode started more than 1 month ago. The problem has been waxing and waning since onset. There has been no fever. His pain is at a severity of 3/10. The pain is mild. Associated symptoms include headaches and sinus pressure. Pertinent negatives include no chills, congestion, coughing, ear pain, hoarse voice, sneezing or sore throat. Past treatments include antibiotics. The treatment provided mild relief.      Review of Systems  Constitutional: Negative for chills.  HENT: Positive for sinus pressure. Negative for congestion, ear pain, hoarse voice, sneezing and sore throat.   Respiratory: Negative for cough.   Neurological: Positive for headaches.  All other systems reviewed and are negative.      Objective:   Physical Exam  Constitutional: He is oriented to person, place, and time. He appears well-developed and well-nourished. No distress.  HENT:  Head: Normocephalic.  Right Ear: External ear normal.  Left Ear: External ear normal.  Nose: Mucosal edema and rhinorrhea present. Right sinus exhibits no maxillary sinus tenderness and no frontal sinus tenderness. Left sinus exhibits no maxillary sinus tenderness and no frontal sinus tenderness.  Mouth/Throat: Posterior oropharyngeal erythema present.  Eyes: Pupils are equal, round, and reactive to light. Right eye exhibits no discharge. Left eye exhibits no discharge.  Neck: Normal range of  motion. Neck supple. No thyromegaly present.  Cardiovascular: Normal rate, regular rhythm, normal heart sounds and intact distal pulses.  No murmur heard. Pulmonary/Chest: Effort normal and breath sounds normal. No respiratory distress. He has no wheezes.  Abdominal: Soft. Bowel sounds are normal. He exhibits no distension. There is no tenderness.  Musculoskeletal: Normal range of motion. He exhibits no edema or tenderness.  Neurological: He is alert and oriented to person, place, and time. He has normal reflexes. No cranial nerve deficit.  Skin: Skin is warm and dry. No rash noted. No erythema.  Psychiatric: He has a normal mood and affect. His behavior is normal. Judgment and thought content normal.  Vitals reviewed.     BP (!) 143/91   Pulse 100   Temp (!) 97 F (36.1 C) (Oral)   Ht 5' 9.5" (1.765 m)   Wt 136 lb (61.7 kg)   BMI 19.80 kg/m      Assessment & Plan:  Kenneth Reed was seen today for pressure left eye and face and headache.  Diagnoses and all orders for this visit:  Acute recurrent maxillary sinusitis -     Ambulatory referral to ENT -     CBC with Differential/Platelet -     BMP8+EGFR  Sinus pain -     Ambulatory referral to ENT -     CBC with Differential/Platelet -     BMP8+EGFR  Non-Hodgkin's lymphoma of tonsil ([place]) -     Ambulatory referral to ENT -     CBC with Differential/Platelet -     BMP8+EGFR   CT  pending, keep appt 09/27/17 Will do referral to ENT Pt continues to see Oncologists annually, depending on results of CT scan Continue flonase and zyrtec   Evelina Dun, FNP

## 2017-09-20 NOTE — Patient Instructions (Signed)

## 2017-09-21 LAB — CBC WITH DIFFERENTIAL/PLATELET
BASOS ABS: 0 10*3/uL (ref 0.0–0.2)
Basos: 1 %
EOS (ABSOLUTE): 0.1 10*3/uL (ref 0.0–0.4)
Eos: 3 %
HEMOGLOBIN: 14.5 g/dL (ref 13.0–17.7)
Hematocrit: 40.8 % (ref 37.5–51.0)
IMMATURE GRANS (ABS): 0 10*3/uL (ref 0.0–0.1)
IMMATURE GRANULOCYTES: 0 %
LYMPHS: 23 %
Lymphocytes Absolute: 0.9 10*3/uL (ref 0.7–3.1)
MCH: 31.9 pg (ref 26.6–33.0)
MCHC: 35.5 g/dL (ref 31.5–35.7)
MCV: 90 fL (ref 79–97)
MONOCYTES: 8 %
Monocytes Absolute: 0.3 10*3/uL (ref 0.1–0.9)
NEUTROS ABS: 2.5 10*3/uL (ref 1.4–7.0)
NEUTROS PCT: 65 %
PLATELETS: 198 10*3/uL (ref 150–450)
RBC: 4.55 x10E6/uL (ref 4.14–5.80)
RDW: 13.7 % (ref 12.3–15.4)
WBC: 3.8 10*3/uL (ref 3.4–10.8)

## 2017-09-21 LAB — BMP8+EGFR
BUN / CREAT RATIO: 16 (ref 10–24)
BUN: 13 mg/dL (ref 8–27)
CALCIUM: 9.4 mg/dL (ref 8.6–10.2)
CHLORIDE: 102 mmol/L (ref 96–106)
CO2: 26 mmol/L (ref 20–29)
CREATININE: 0.81 mg/dL (ref 0.76–1.27)
GFR calc Af Amer: 101 mL/min/{1.73_m2} (ref >59)
GFR calc non Af Amer: 88 mL/min/{1.73_m2} (ref >59)
Glucose: 92 mg/dL (ref 65–99)
Potassium: 4.4 mmol/L (ref 3.5–5.2)
Sodium: 140 mmol/L (ref 134–144)

## 2017-09-23 ENCOUNTER — Other Ambulatory Visit: Payer: Self-pay | Admitting: Physician Assistant

## 2017-09-23 DIAGNOSIS — Z8572 Personal history of non-Hodgkin lymphomas: Secondary | ICD-10-CM

## 2017-09-23 DIAGNOSIS — Z8579 Personal history of other malignant neoplasms of lymphoid, hematopoietic and related tissues: Secondary | ICD-10-CM

## 2017-09-27 ENCOUNTER — Ambulatory Visit
Admission: RE | Admit: 2017-09-27 | Discharge: 2017-09-27 | Disposition: A | Discharge status: Home / Self Care | Payer: Medicare Other | Source: Ambulatory Visit | Attending: Physician Assistant | Admitting: Physician Assistant

## 2017-09-27 DIAGNOSIS — Z8579 Personal history of other malignant neoplasms of lymphoid, hematopoietic and related tissues: Secondary | ICD-10-CM

## 2017-09-27 DIAGNOSIS — J019 Acute sinusitis, unspecified: Secondary | ICD-10-CM | POA: Diagnosis not present

## 2017-10-02 ENCOUNTER — Telehealth: Payer: Self-pay | Admitting: Family

## 2017-10-02 NOTE — Telephone Encounter (Signed)
Aware of CT of sinus results.

## 2017-10-08 DIAGNOSIS — J329 Chronic sinusitis, unspecified: Secondary | ICD-10-CM | POA: Diagnosis not present

## 2017-10-08 DIAGNOSIS — J324 Chronic pansinusitis: Secondary | ICD-10-CM | POA: Diagnosis not present

## 2018-01-22 ENCOUNTER — Ambulatory Visit: Payer: Medicare Other | Admitting: Urology

## 2018-01-22 DIAGNOSIS — C670 Malignant neoplasm of trigone of bladder: Secondary | ICD-10-CM | POA: Diagnosis not present

## 2018-01-22 DIAGNOSIS — N401 Enlarged prostate with lower urinary tract symptoms: Secondary | ICD-10-CM | POA: Diagnosis not present

## 2018-01-28 DIAGNOSIS — Z23 Encounter for immunization: Secondary | ICD-10-CM | POA: Diagnosis not present

## 2018-03-11 ENCOUNTER — Ambulatory Visit: Payer: Medicare Other | Admitting: Family

## 2018-03-11 ENCOUNTER — Encounter: Payer: Self-pay | Admitting: Family

## 2018-03-11 VITALS — BP 145/88 | HR 99 | Temp 97.0°F | Ht 69.5 in | Wt 140.4 lb

## 2018-03-11 DIAGNOSIS — J441 Chronic obstructive pulmonary disease with (acute) exacerbation: Secondary | ICD-10-CM

## 2018-03-11 DIAGNOSIS — J42 Unspecified chronic bronchitis: Secondary | ICD-10-CM | POA: Diagnosis not present

## 2018-03-11 MED ORDER — ALBUTEROL SULFATE HFA 108 (90 BASE) MCG/ACT IN AERS: 2.0000 | INHALATION_SPRAY | RESPIRATORY_TRACT | 2 refills | Status: DC | PRN

## 2018-03-11 MED ORDER — AZITHROMYCIN 250 MG PO TABS: ORAL_TABLET | ORAL | 0 refills | Status: DC

## 2018-03-11 MED ORDER — PREDNISONE 20 MG PO TABS: ORAL_TABLET | ORAL | 0 refills | Status: DC

## 2018-03-11 NOTE — Patient Instructions (Signed)

## 2018-03-11 NOTE — Progress Notes (Signed)
Subjective:    Patient ID: Kenneth Reed, male    DOB: 1946-03-01, 72 y.o.   MRN: [id number]  Chief Complaint  Patient presents with  . Cough    chest congestion with sputum    Cough  This is a new problem. The current episode started 1 to 4 weeks ago. The problem has been gradually worsening. The problem occurs every few minutes. The cough is productive of sputum and productive of purulent sputum. Associated symptoms include postnasal drip, shortness of breath and wheezing. Pertinent negatives include no chills, ear congestion, ear pain, fever, headaches, myalgias, nasal congestion, rhinorrhea or sore throat. The symptoms are aggravated by lying down. He has tried rest and OTC cough suppressant for the symptoms. The treatment provided mild relief. His past medical history is significant for COPD.      Review of Systems  Constitutional: Negative for chills and fever.  HENT: Positive for postnasal drip. Negative for ear pain, rhinorrhea and sore throat.   Respiratory: Positive for cough, shortness of breath and wheezing.   Musculoskeletal: Negative for myalgias.  Neurological: Negative for headaches.  All other systems reviewed and are negative.      Objective:   Physical Exam  Constitutional: He is oriented to person, place, and time. He appears well-developed and well-nourished. No distress.  HENT:  Head: Normocephalic.  Right Ear: External ear normal.  Left Ear: External ear normal.  Mouth/Throat: Posterior oropharyngeal erythema present.  Eyes: Pupils are equal, round, and reactive to light. Right eye exhibits no discharge. Left eye exhibits no discharge.  Neck: Normal range of motion. Neck supple. No thyromegaly present.  Cardiovascular: Normal rate, regular rhythm, normal heart sounds and intact distal pulses.  No murmur heard. Pulmonary/Chest: Effort normal. No respiratory distress. He has wheezes. He has rhonchi.  Abdominal: Soft. Bowel sounds are normal. He exhibits  no distension. There is no tenderness.  Musculoskeletal: Normal range of motion. He exhibits no edema or tenderness.  Neurological: He is alert and oriented to person, place, and time. He has normal reflexes. No cranial nerve deficit.  Skin: Skin is warm and dry. No rash noted. No erythema.  Psychiatric: He has a normal mood and affect. His behavior is normal. Judgment and thought content normal.  Vitals reviewed.     BP (!) 145/88   Pulse 99   Temp (!) 97 F (36.1 C) (Oral)   Ht 5' 9.5" (1.765 m)   Wt 140 lb 6.4 oz (63.7 kg)   BMI 20.44 kg/m      Assessment & Plan:  TIMOHTY RENBARGER comes in today with chief complaint of Cough (chest congestion with sputum)   Diagnosis and orders addressed:  1. COPD exacerbation ([place]) - Take meds as prescribed - Use a cool mist humidifier  -Use saline nose sprays frequently -Force fluids -For any cough or congestion  Use plain Mucinex- regular strength or max strength is fine -For fever or aces or pains- take tylenol or ibuprofen. -Throat lozenges if help -RTO if symptoms worsen or do not improve - azithromycin (ZITHROMAX) 250 MG tablet; Take 500 mg once, then 250 mg for four days  Dispense: 6 tablet; Refill: 0 - predniSONE (DELTASONE) 20 MG tablet; Take 3 tabs daily for 1 week, then 2 tabs for 1 week, then 1 tab for one week  Dispense: 42 tablet; Refill: 0 - albuterol (PROVENTIL HFA;VENTOLIN HFA) 108 (90 Base) MCG/ACT inhaler; Inhale 2 puffs into the lungs every 4 (four) hours as needed.  Dispense:  1 Inhaler; Refill: 2  2. Chronic bronchitis, unspecified chronic bronchitis type (HCC) - albuterol (PROVENTIL HFA;VENTOLIN HFA) 108 (90 Base) MCG/ACT inhaler; Inhale 2 puffs into the lungs every 4 (four) hours as needed.  Dispense: 1 Inhaler; Refill: [place], FNP'

## 2018-06-07 DIAGNOSIS — J441 Chronic obstructive pulmonary disease with (acute) exacerbation: Secondary | ICD-10-CM | POA: Diagnosis not present

## 2018-06-07 DIAGNOSIS — R05 Cough: Secondary | ICD-10-CM | POA: Diagnosis not present

## 2018-06-17 DIAGNOSIS — J40 Bronchitis, not specified as acute or chronic: Secondary | ICD-10-CM | POA: Diagnosis not present

## 2018-07-18 ENCOUNTER — Encounter: Payer: Self-pay | Admitting: Hematology and Oncology

## 2018-08-07 ENCOUNTER — Encounter: Payer: Medicare Other | Admitting: *Deleted

## 2018-08-11 ENCOUNTER — Other Ambulatory Visit: Payer: Self-pay | Admitting: Hematology and Oncology

## 2018-08-11 DIAGNOSIS — C8599 Non-Hodgkin lymphoma, unspecified, extranodal and solid organ sites: Secondary | ICD-10-CM

## 2018-08-11 DIAGNOSIS — Z85038 Personal history of other malignant neoplasm of large intestine: Secondary | ICD-10-CM

## 2018-08-18 ENCOUNTER — Inpatient Hospital Stay: Payer: Medicare Other | Attending: Hematology and Oncology

## 2018-08-18 ENCOUNTER — Inpatient Hospital Stay: Payer: Medicare Other | Admitting: Hematology and Oncology

## 2018-08-19 ENCOUNTER — Telehealth: Payer: Self-pay | Admitting: Hematology and Oncology

## 2018-08-19 NOTE — Telephone Encounter (Signed)
Rescheduled no show appt per sch msg. Called patient, no answer, left msg. Mailed printout

## 2018-09-03 ENCOUNTER — Encounter: Payer: Self-pay | Admitting: Hematology and Oncology

## 2018-09-18 ENCOUNTER — Ambulatory Visit: Payer: Medicare Other | Admitting: Family

## 2018-09-19 ENCOUNTER — Inpatient Hospital Stay: Payer: Medicare Other | Admitting: Hematology and Oncology

## 2018-09-19 ENCOUNTER — Inpatient Hospital Stay: Payer: Medicare Other

## 2018-09-29 ENCOUNTER — Ambulatory Visit: Payer: Medicare Other | Admitting: Family

## 2018-11-10 IMAGING — CT CT CHEST W/ CM
2 of 6 series · 13 of 36 positions shown, 16 images · IV contrast (ISOVUE)
Comparison: Chest CT 07/30/2016.  Abdomen and pelvis CT 08/03/2015.

CLINICAL DATA: Non-Hodgkin's lymphoma of the tonsil. Also with a
history of colon cancer status post resection and bladder cancer.

EXAM:
CT CHEST, ABDOMEN, AND PELVIS WITH CONTRAST
TECHNIQUE: Multidetector CT imaging of the chest, abdomen and pelvis was
performed following the standard protocol during bolus
administration of intravenous contrast.
CONTRAST:  100mL OMNIPAQUE IOHEXOL 300 MG/ML  SOLN

[Series 3: thins · axial · 0.83mm/px · z∈[-631,-25]mm · 10 of 963 slices shown, 13 images]
[im 49/963  mediastinal]
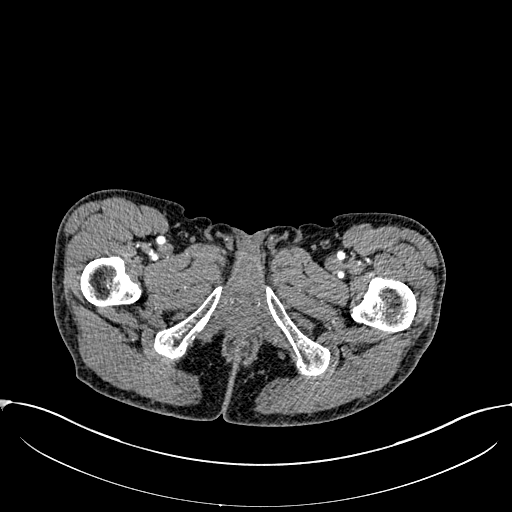
[im 49/963  lung]
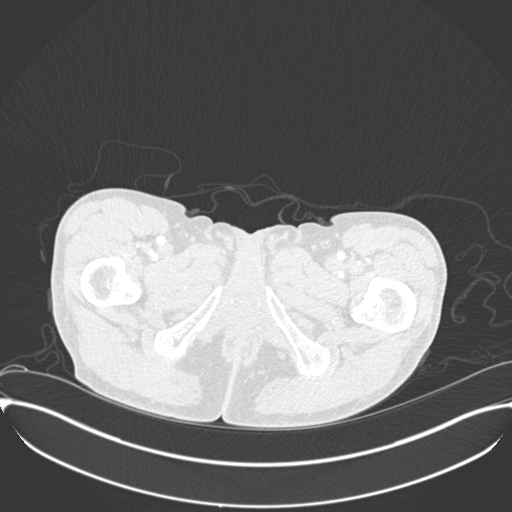
[im 145/963  lung]
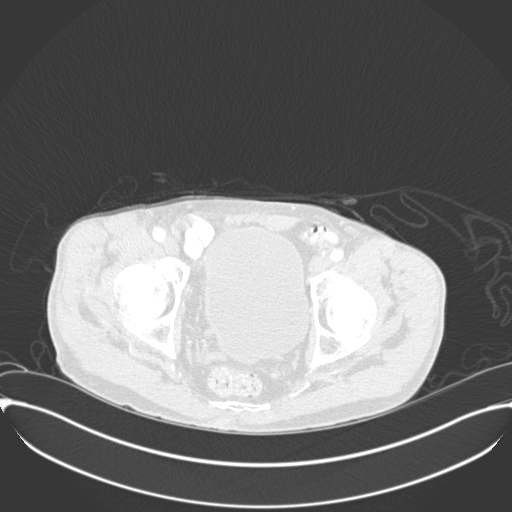
[im 241/963  lung]
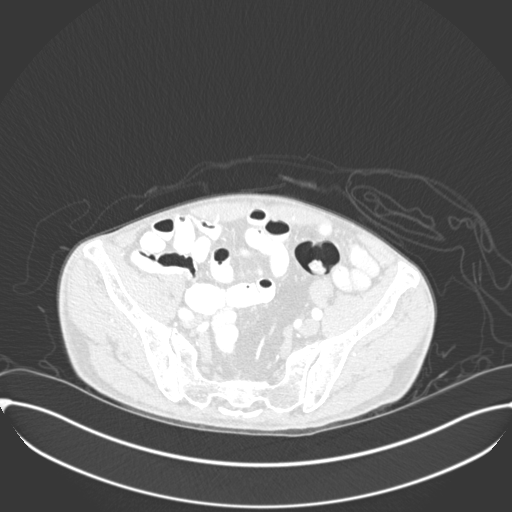
[im 337/963  lung]
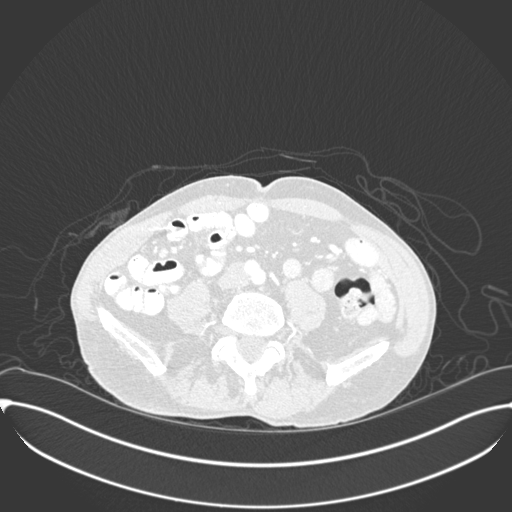
[im 433/963  mediastinal]
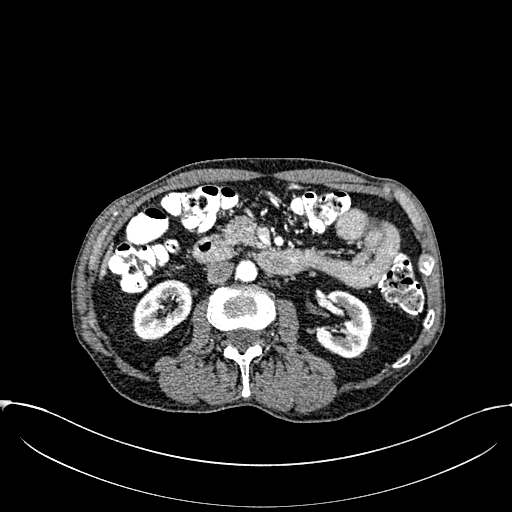
[im 433/963  lung]
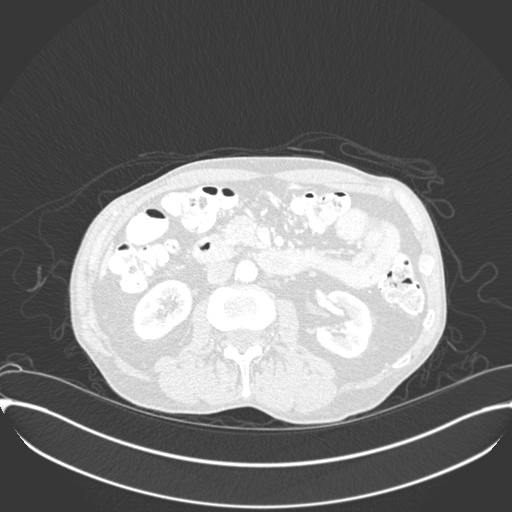
[im 530/963  lung]
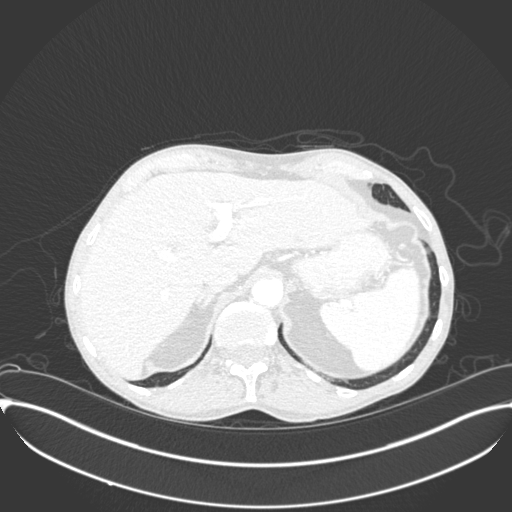
[im 626/963  lung]
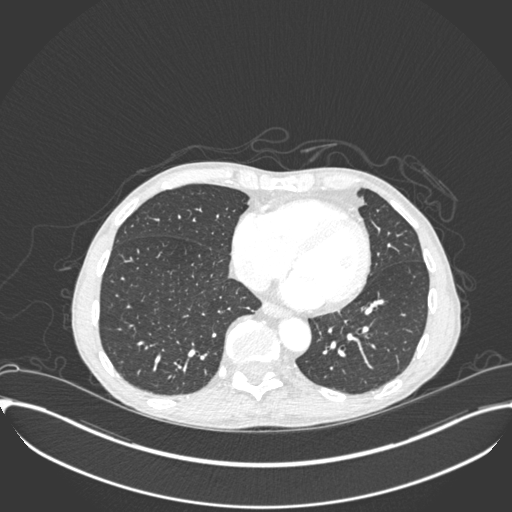
[im 722/963  lung]
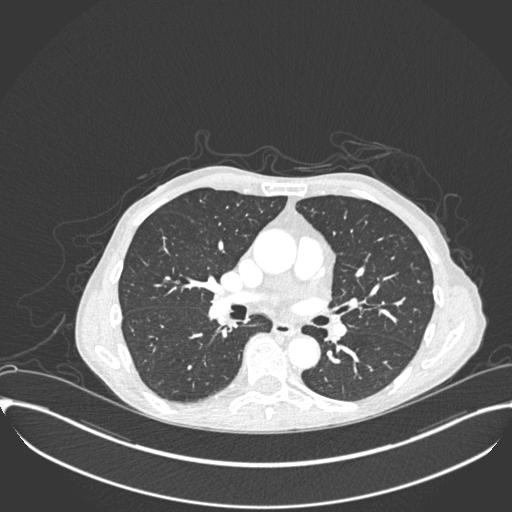
[im 818/963  mediastinal]
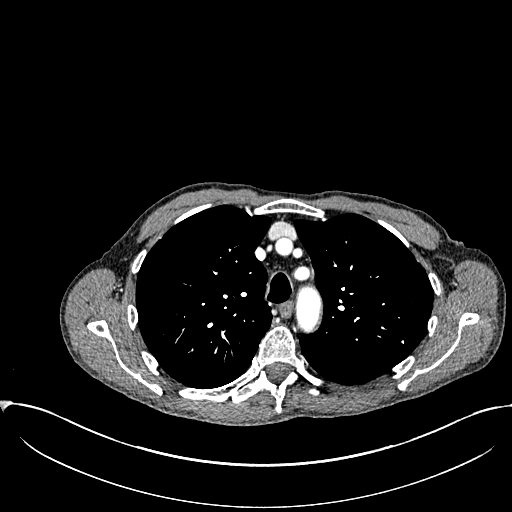
[im 818/963  lung]
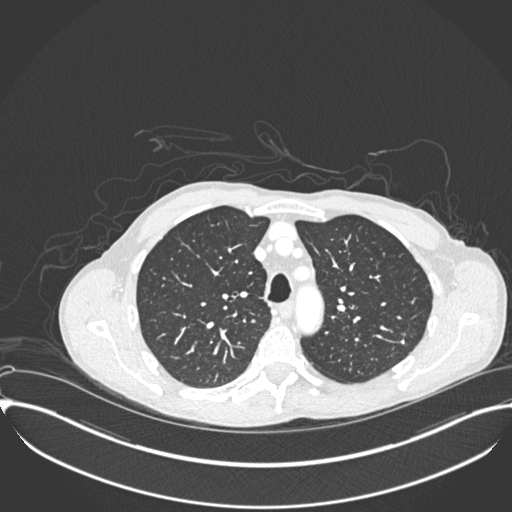
[im 914/963  lung]
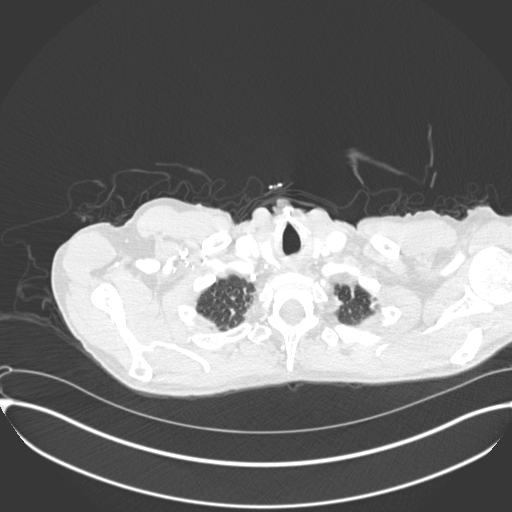

[Series 5: coronals · coronal · 0.83mm/px · 3 of 135 slices shown]
[im 27/135  lung]
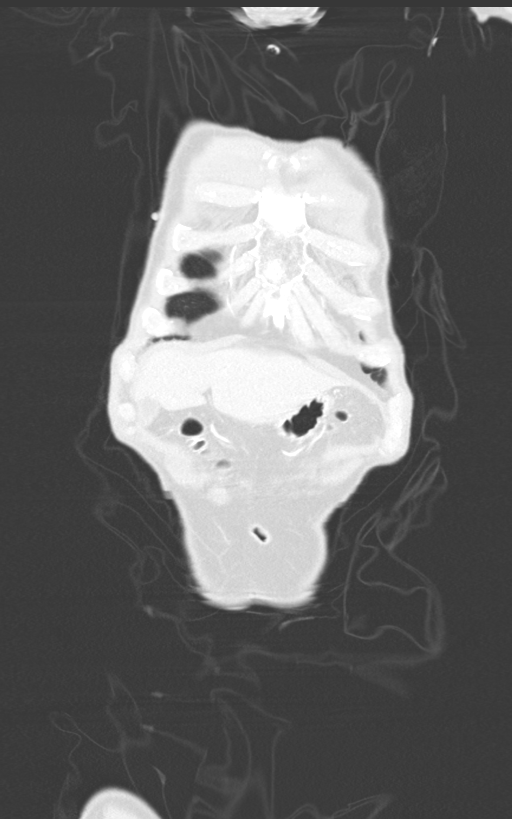
[im 54/135  lung]
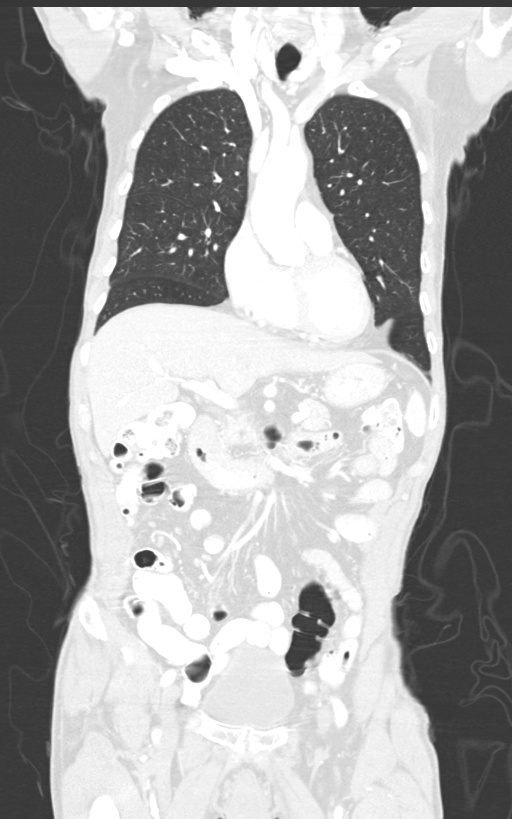
[im 81/135  lung]
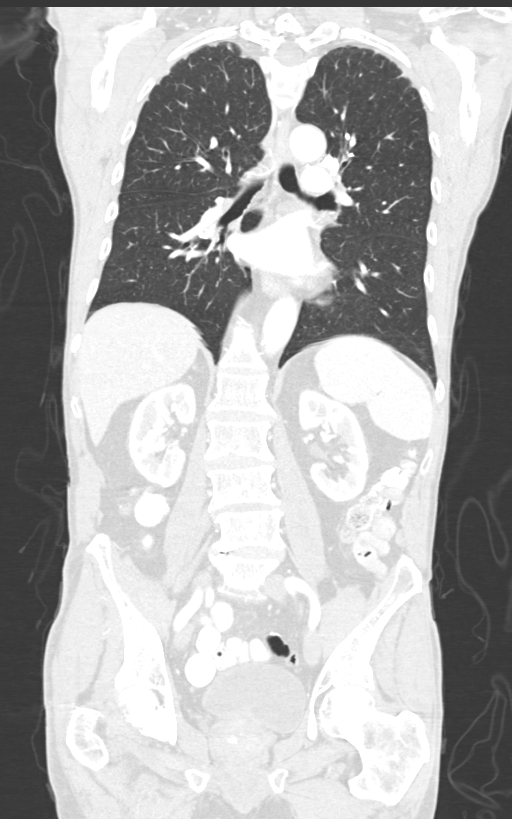

[13 of 36 positions shown; findings below may reference images not displayed]

FINDINGS: CT CHEST FINDINGS

Cardiovascular: The heart size is normal. No pericardial effusion.
Atherosclerotic calcification is noted in the wall of the thoracic
aorta.

Mediastinum/Nodes: No mediastinal lymphadenopathy. There is no hilar
lymphadenopathy. Calcified nodal tissue is identified in the hilar
regions bilaterally. The esophagus has normal imaging features.
There is no axillary lymphadenopathy.

Lungs/Pleura: The central tracheobronchial airways are patent.
Biapical pleuroparenchymal scarring is evident. Calcified
granulomata are noted in the lungs bilaterally. Ground-glass nodule
in the posterior left upper lobe measures 10 x 12 mm today compared
to 12 x 13 mm previously. 3 mm medial right upper lobe nodule (image
52/series 4) is stable. No new or suspicious pulmonary nodule or
mass. No focal airspace consolidation. No pleural effusion.

Musculoskeletal: Bone windows reveal no worrisome lytic or sclerotic
osseous lesions.

CT ABDOMEN PELVIS FINDINGS

Hepatobiliary: No focal abnormality within the liver parenchyma.
There is no evidence for gallstones, gallbladder wall thickening, or
pericholecystic fluid. No intrahepatic or extrahepatic biliary
dilation.

Pancreas: No focal mass lesion. No dilatation of the main duct. No
intraparenchymal cyst. No peripancreatic edema.

Spleen: Calcified granuloma.

Adrenals/Urinary Tract: No adrenal nodule or mass. Stable 9 mm
exophytic lesion upper pole left kidney with no substantial change
in the 7 mm hypoattenuating lesion in the upper pole the right
kidney. No evidence for hydroureter. The urinary bladder appears
normal for the degree of distention.

Stomach/Bowel: Stomach is nondistended. No gastric wall thickening.
No evidence of outlet obstruction. Duodenum is normally positioned
as is the ligament of Treitz. No small bowel wall thickening. No
small bowel dilatation. Suture line noted in the distal sigmoid
colon.

Vascular/Lymphatic: There is abdominal aortic atherosclerosis
without aneurysm. There is no gastrohepatic or hepatoduodenal
ligament lymphadenopathy. No intraperitoneal or retroperitoneal
lymphadenopathy. No pelvic sidewall lymphadenopathy.

Reproductive: Prostate gland is enlarged.

Other: No intraperitoneal free fluid.

Musculoskeletal: 10 mm sclerotic lesion in the left iliac crest
stable since 08/03/2015. Bilateral pars interarticularis defects are
noted at L5.
IMPRESSION: 1. Stable CT chest.  No findings to suggest metastatic disease.
2. Stable 12 mm left upper lobe ground-glass nodule. Repeat CT is
recommended every 2 years until 5 years of stability has been
established. This recommendation follows the consensus statement:
Guidelines for Management of Incidental Pulmonary Nodules Detected
[DATE].
3. No evidence for lymphadenopathy or metastatic disease in the
abdomen/pelvis.
4.  Aortic Atherosclerois (KY31R-170.0)
5. Prostatomegaly.

## 2019-04-07 ENCOUNTER — Telehealth: Payer: Self-pay | Admitting: Family

## 2019-04-07 NOTE — Telephone Encounter (Signed)
Throat feels irritated Sinus drainage  Hx of lymphoma in throat in past   No fever, no cough  TELE made for tomorrow

## 2019-04-08 ENCOUNTER — Encounter: Payer: Self-pay | Admitting: Family Medicine

## 2019-04-08 ENCOUNTER — Ambulatory Visit (INDEPENDENT_AMBULATORY_CARE_PROVIDER_SITE_OTHER): Payer: Medicare Other | Admitting: Family Medicine

## 2019-04-08 DIAGNOSIS — Z8579 Personal history of other malignant neoplasms of lymphoid, hematopoietic and related tissues: Secondary | ICD-10-CM | POA: Diagnosis not present

## 2019-04-08 DIAGNOSIS — J029 Acute pharyngitis, unspecified: Secondary | ICD-10-CM | POA: Diagnosis not present

## 2019-04-08 DIAGNOSIS — Z8551 Personal history of malignant neoplasm of bladder: Secondary | ICD-10-CM | POA: Diagnosis not present

## 2019-04-08 DIAGNOSIS — Z85038 Personal history of other malignant neoplasm of large intestine: Secondary | ICD-10-CM

## 2019-04-08 NOTE — Progress Notes (Signed)
Virtual Visit via Telephone Note  I connected with Kenneth Reed on 04/08/19 at 1:10 AM by telephone and verified that I am speaking with the correct person using two identifiers. Kenneth Reed is currently located at home and his daughter is currently with him during this visit, which he is okay with. The provider, Loman Brooklyn, FNP is located in their home at time of visit.  I discussed the limitations, risks, security and privacy concerns of performing an evaluation and management service by telephone and the availability of in person appointments. I also discussed with the patient that there may be a patient responsible charge related to this service. The patient expressed understanding and agreed to proceed.  Subjective: PCP: Sharion Balloon, FNP  Chief Complaint  Patient presents with  . Sore Throat   Patient complains of sore throat. Additional symptoms include postnasal drainage. Onset of symptoms was 3 weeks ago, gradually worsening since that time. He is drinking plenty of fluids. Evaluation to date: none. Treatment to date: nasal steroids. He has a history of chronic sinus issues. He does not smoke.  Patient reports a sore throat that would not go away was his first symptom of non-Hodgkin's lymphoma 7 years ago.  He was released by oncology 2 years ago.  He does have an ENT that he saw last year and feels like he needs to go back to see him but did not know if he needed a referral.   ROS: Per HPI  Current Outpatient Medications:  .  acetaminophen (TYLENOL) 500 MG tablet, Take 500 mg by mouth every 6 (six) hours as needed (pain)., Disp: , Rfl:  .  albuterol (PROVENTIL HFA;VENTOLIN HFA) 108 (90 Base) MCG/ACT inhaler, Inhale 2 puffs into the lungs every 4 (four) hours as needed., Disp: 1 Inhaler, Rfl: 2 .  azithromycin (ZITHROMAX) 250 MG tablet, Take 500 mg once, then 250 mg for four days, Disp: 6 tablet, Rfl: 0 .  finasteride (PROSCAR) 5 MG tablet, Take 5 mg by mouth daily.,  Disp: , Rfl:  .  fluticasone (FLONASE) 50 MCG/ACT nasal spray, Place 1 spray into both nostrils 2 (two) times daily as needed for allergies or rhinitis., Disp: 16 g, Rfl: 6 .  Fluticasone-Salmeterol (ADVAIR DISKUS) 250-50 MCG/DOSE AEPB, Inhale 2 puffs in the morning, Disp: 60 each, Rfl: 6 .  predniSONE (DELTASONE) 20 MG tablet, Take 3 tabs daily for 1 week, then 2 tabs for 1 week, then 1 tab for one week, Disp: 42 tablet, Rfl: 0 .  tamsulosin (FLOMAX) 0.4 MG CAPS capsule, Take 0.4 mg by mouth., Disp: , Rfl:   No Known Allergies Past Medical History:  Diagnosis Date  . Bladder cancer [facility]) 2010   Dr. Maryland Pink operated; no need for chemo; recurred locally  in 2013 locally.    Marland Kitchen BPH (benign prostatic hyperplasia)   . Cancer ([place]) 03/26/12   High Grade NonHodgkin's B- Cell Lymphoma  . Colon cancer [facility]) 2010   Dr. Anthony Sar operated; stage II.  No need for chemo.  . Fatigue   . History of colon cancer 01/07/2013  . Non-Hodgkin's lymphoma of head ([place]) 2014  . Status post chemotherapy 04/18/2012 - 06/20/12    4 Cycles of R-CHOP with 3 Doses of Prophylactic Intrathecal chemo.  . Weight loss 07/11/12 note    Observations/Objective: A&O  No respiratory distress or wheezing audible over the phone Mood, judgement, and thought processes all WNL Patient's daughter did look in his throat while we were  on the phone and she reports there is no erythema or white spots.  Assessment and Plan: 1. Sore throat - Encouraged patient to come in the office for labs and to schedule follow-up with ENT.  Discussed that we do not typically need a referral if you are an established patient and it has been less than 3 years since your last seen.  Continue use of Flonase.  Try adding antihistamine once daily. - CBC With Differential  2-4. History of lymphoma/History of bladder cancer/History of colon cancer - CBC With Differential - CEA   Follow Up Instructions:  I discussed the assessment and treatment plan with  the patient. The patient was provided an opportunity to ask questions and all were answered. The patient agreed with the plan and demonstrated an understanding of the instructions.   The patient was advised to call back or seek an in-person evaluation if the symptoms worsen or if the condition fails to improve as anticipated.  The above assessment and management plan was discussed with the patient. The patient verbalized understanding of and has agreed to the management plan. Patient is aware to call the clinic if symptoms persist or worsen. Patient is aware when to return to the clinic for a follow-up visit. Patient educated on when it is appropriate to go to the emergency department.   Time call ended: 1:33 PM  I provided 25 minutes of non-face-to-face time during this encounter.  Hendricks Limes, MSN, APRN, FNP-C [facility] 04/08/19

## 2019-04-17 LAB — CBC WITH DIFFERENTIAL
Basophils Absolute: 0 10*3/uL (ref 0.0–0.2)
Basos: 1 %
EOS (ABSOLUTE): 0.1 10*3/uL (ref 0.0–0.4)
Eos: 2 %
Hematocrit: 46.3 % (ref 37.5–51.0)
Hemoglobin: 15.6 g/dL (ref 13.0–17.7)
Immature Grans (Abs): 0 10*3/uL (ref 0.0–0.1)
Immature Granulocytes: 0 %
Lymphocytes Absolute: 1.1 10*3/uL (ref 0.7–3.1)
Lymphs: 24 %
MCH: 32.2 pg (ref 26.6–33.0)
MCHC: 33.7 g/dL (ref 31.5–35.7)
MCV: 96 fL (ref 79–97)
Monocytes Absolute: 0.3 10*3/uL (ref 0.1–0.9)
Monocytes: 6 %
Neutrophils Absolute: 3 10*3/uL (ref 1.4–7.0)
Neutrophils: 67 %
RBC: 4.85 x10E6/uL (ref 4.14–5.80)
RDW: 12.1 % (ref 11.6–15.4)
WBC: 4.5 10*3/uL (ref 3.4–10.8)

## 2019-04-17 LAB — CEA: CEA: 2.2 ng/mL (ref 0.0–4.7)

## 2019-04-23 ENCOUNTER — Ambulatory Visit: Payer: Medicare Other | Admitting: Family Medicine

## 2019-05-20 ENCOUNTER — Encounter: Payer: Self-pay | Admitting: Family Medicine

## 2019-06-15 DIAGNOSIS — K219 Gastro-esophageal reflux disease without esophagitis: Secondary | ICD-10-CM | POA: Insufficient documentation

## 2019-08-19 ENCOUNTER — Other Ambulatory Visit: Payer: Self-pay

## 2019-08-19 ENCOUNTER — Ambulatory Visit (INDEPENDENT_AMBULATORY_CARE_PROVIDER_SITE_OTHER): Payer: Medicare Other | Admitting: Urology

## 2019-08-19 ENCOUNTER — Encounter: Payer: Self-pay | Admitting: Urology

## 2019-08-19 VITALS — BP 150/92 | HR 91 | Temp 98.4°F | Ht 68.0 in | Wt 135.0 lb

## 2019-08-19 DIAGNOSIS — N401 Enlarged prostate with lower urinary tract symptoms: Secondary | ICD-10-CM | POA: Diagnosis not present

## 2019-08-19 LAB — POCT URINALYSIS DIPSTICK
Bilirubin, UA: NEGATIVE
Blood, UA: NEGATIVE
Glucose, UA: NEGATIVE
Ketones, UA: NEGATIVE
Leukocytes, UA: NEGATIVE
Nitrite, UA: NEGATIVE
Protein, UA: NEGATIVE
Spec Grav, UA: 1.015 (ref 1.010–1.025)
Urobilinogen, UA: NEGATIVE E.U./dL — AB
pH, UA: 5 (ref 5.0–8.0)

## 2019-08-19 MED ORDER — CIPROFLOXACIN HCL 500 MG PO TABS: 500.0000 mg | ORAL_TABLET | Freq: Once | ORAL | Status: DC

## 2019-08-19 MED ORDER — CIPROFLOXACIN HCL 500 MG PO TABS
500.0000 mg | ORAL_TABLET | Freq: Once | ORAL | Status: AC
  Administered 2019-08-19: 500 mg via ORAL

## 2019-08-19 NOTE — Progress Notes (Signed)
ciproUrological Symptom Review  Patient is experiencing the following symptoms: Frequent urination Hard to postpone urination Get up at night to urinate Stream starts and stops Trouble starting stream   Review of Systems  Gastrointestinal (upper)  : Negative for upper GI symptoms  Gastrointestinal (lower) : Negative for lower GI symptoms  Constitutional : Negative for symptoms  Skin: Negative for skin symptoms  Eyes: Negative for eye symptoms  Ear/Nose/Throat : Negative for Ear/Nose/Throat symptoms  Hematologic/Lymphatic: None  Cardiovascular : Negative for cardiovascular symptoms  Respiratory : Negative for respiratory symptoms  Endocrine: Negative for endocrine symptoms  Musculoskeletal: Negative for musculoskeletal symptoms  Neurological: Negative for neurological symptoms  Psychologic: Negative for psychiatric symptoms

## 2019-08-19 NOTE — Progress Notes (Signed)
   08/19/19  CC: hx of bladder cancer  HPI: Kenneth Reed is a 74yo here for followup for bladder cancer. Bladder cancer resected in 2011 by Dr. Maryland Pink. NO recent hematuria or dysuria. Last cysto 2019  Blood pressure (!) 150/92, pulse 91, temperature 98.4 F (36.9 C), height 5\' 8"  (1.727 m), weight 135 lb (61.2 kg). NED. A&Ox3.   No respiratory distress   Abd soft, NT, ND Normal phallus with bilateral descended testicles  Cystoscopy Procedure Note  Patient identification was confirmed, informed consent was obtained, and patient was prepped using Betadine solution.  Lidocaine jelly was administered per urethral meatus.     Pre-Procedure: - Inspection reveals a normal caliber ureteral meatus.  Procedure: The flexible cystoscope was introduced without difficulty - No urethral strictures/lesions are present. - Enlarged prostate  - Normal bladder neck - Bilateral ureteral orifices identified - Bladder mucosa  reveals no ulcers,  - No bladder stones - No trabeculation  Retroflexion shows 1cm papillary tumor at bladder neck   Post-Procedure: - Patient tolerated the procedure well  Assessment/ Plan: Bladder tumor: -Schedule for bladder tumor resection. Risks/benefits/alterantives discussed  No follow-ups on file.  [name], MD

## 2019-08-19 NOTE — Patient Instructions (Signed)
Bladder Cancer  Bladder cancer is an abnormal growth of tissue in the bladder. The bladder is the balloon-like sac in the pelvis. It collects and stores urine that comes from the kidneys through the ureters. The bladder wall is made of layers. If cancer spreads into these layers and through the wall of the bladder, it becomes more difficult to treat. What are the causes? The cause of this condition is not known. What increases the risk? The following factors may make you more likely to develop this condition:  Smoking.  Workplace risks (occupational exposures), such as rubber, leather, textile, dyes, chemicals, and paint.  Being white.  Your age. Most people with bladder cancer are over the age of 55.  Being male.  Having chronic bladder inflammation.  Having a personal history of bladder cancer.  Having a family history of bladder cancer (heredity).  Having had chemotherapy or radiation therapy to the pelvis.  Having been exposed to arsenic. What are the signs or symptoms? Initial symptoms of this condition include:  Blood in the urine.  Painful urination.  Frequent bladder or urine infections.  Increase in urgency and frequency of urination. Advanced symptoms of this condition include:  Not being able to urinate.  Low back pain on one side.  Loss of appetite.  Weight loss.  Fatigue.  Swelling in the feet.  Bone pain. How is this diagnosed? This condition is diagnosed based on your medical history, a physical exam, urine tests, lab tests, imaging tests, and your symptoms. You may also have other tests or procedures done, such as:  A narrow tube being inserted into your bladder through your urethra (cystoscopy) in order to view the lining of your bladder for tumors.  A biopsy to sample the tumor to see if cancer is present. If cancer is present, it will then be staged to determine its severity and extent. Staging is an assessment of:  The size of the  tumor.  Whether the cancer has spread.  Where the cancer has spread. It is important to know how deeply into the bladder wall cancer has grown and whether cancer has spread to any other parts of your body. Staging may require blood tests or imaging tests, such as a CT scan, MRI, bone scan, or chest X-ray. How is this treated? Based on the stage of cancer, one treatment or a combination of treatments may be recommended. The most common forms of treatment are:  Surgery to remove the cancer. Procedures that may be done include transurethral resection and cystectomy.  Radiation therapy. This is high-energy X-rays or other particles. This is often used in combination with chemotherapy.  Chemotherapy. During this treatment, medicines are used to kill cancer cells.  Immunotherapy. This uses medicines to help your own immune system destroy cancer cells. Follow these instructions at home:  Take over-the-counter and prescription medicines only as told by your health care provider.  Maintain a healthy diet. Some of your treatments might affect your appetite.  Consider joining a support group. This may help you learn to cope with the stress of having bladder cancer.  Tell your cancer care team if you develop side effects. They may be able to recommend ways to relieve them.  Keep all follow-up visits as told by your health care provider. This is important. Where to find more information  American Cancer Society: www.cancer.org  National Cancer Institute (NCI): www.cancer.gov Contact a health care provider if:  You have symptoms of a urinary tract infection. These include: ?   Fever. ? Chills. ? Weakness. ? Muscle aches. ? Abdominal pain. ? Frequent and intense urge to urinate. ? Burning feeling in the bladder or urethra during urination. Get help right away if:  There is blood in your urine.  You cannot urinate.  You have severe pain or other symptoms that do not go  away. Summary  Bladder cancer is an abnormal growth of tissue in the bladder.  This condition is diagnosed based on your medical history, a physical exam, urine tests, lab tests, imaging tests, and your symptoms.  Based on the stage of cancer, surgery, chemotherapy, or a combination of treatments may be recommended.  Consider joining a support group. This may help you learn to cope with the stress of having bladder cancer. This information is not intended to replace advice given to you by your health care provider. Make sure you discuss any questions you have with your health care provider. Document Revised: 03/08/2017 Document Reviewed: 02/28/2016 Elsevier Patient Education  2020 Elsevier Inc.  

## 2019-08-19 NOTE — H&P (View-Only) (Signed)
   08/19/19  CC: hx of bladder cancer  HPI: Mr Denke is a 74yo here for followup for bladder cancer. Bladder cancer resected in 2011 by Dr. Maryland Pink. NO recent hematuria or dysuria. Last cysto 2019  Blood pressure (!) 150/92, pulse 91, temperature 98.4 F (36.9 C), height 5\' 8"  (1.727 m), weight 135 lb (61.2 kg). NED. A&Ox3.   No respiratory distress   Abd soft, NT, ND Normal phallus with bilateral descended testicles  Cystoscopy Procedure Note  Patient identification was confirmed, informed consent was obtained, and patient was prepped using Betadine solution.  Lidocaine jelly was administered per urethral meatus.     Pre-Procedure: - Inspection reveals a normal caliber ureteral meatus.  Procedure: The flexible cystoscope was introduced without difficulty - No urethral strictures/lesions are present. - Enlarged prostate  - Normal bladder neck - Bilateral ureteral orifices identified - Bladder mucosa  reveals no ulcers,  - No bladder stones - No trabeculation  Retroflexion shows 1cm papillary tumor at bladder neck   Post-Procedure: - Patient tolerated the procedure well  Assessment/ Plan: Bladder tumor: -Schedule for bladder tumor resection. Risks/benefits/alterantives discussed  No follow-ups on file.  [name], MD

## 2019-08-25 ENCOUNTER — Telehealth: Payer: Self-pay

## 2019-08-25 NOTE — Telephone Encounter (Signed)
Pre op appt and covid appt received from [place].  May 27th 11:00 Covid June 1 at 9:30  Daughter verbalized understanding.

## 2019-08-31 NOTE — Patient Instructions (Signed)
Kenneth Reed  08/31/2019     @PREFPERIOPPHARMACY @   Your procedure is scheduled on  09/09/2019 .  Report to [facility] at  1100  A.M.  Call this number if you have problems the morning of surgery:  [phone]   Remember:  Do not eat or drink after midnight.                        Take these medicines the morning of surgery with A SIP OF WATER  Proscar, flomax. Use your inhalers before you come and bring your rescue inhaler with you.    Do not wear jewelry, make-up or nail polish.  Do not wear lotions, powders, or perfumes. Please wear deodorant and brush your teeth.  Do not shave 48 hours prior to surgery.  Men may shave face and neck.  Do not bring valuables to the hospital.  [facility] is not responsible for any belongings or valuables.  Contacts, dentures or bridgework may not be worn into surgery.  Leave your suitcase in the car.  After surgery it may be brought to your room.  For patients admitted to the hospital, discharge time will be determined by your treatment team.  Patients discharged the day of surgery will not be allowed to drive home.   Name and phone number of your driver:   family Special instructions:  DO NOT smoke the morning of your procedure.  Please read over the following fact sheets that you were given. Anesthesia Post-op Instructions and Care and Recovery After Surgery       Transurethral Resection of Bladder Tumor, Care After This sheet gives you information about how to care for yourself after your procedure. Your health care provider may also give you more specific instructions. If you have problems or questions, contact your health care provider. What can I expect after the procedure? After the procedure, it is common to have:  A small amount of blood in your urine for up to 2 weeks.  Soreness or mild pain from your catheter. After your catheter is removed, you may have mild soreness, especially when urinating.  Pain in your  lower abdomen. Follow these instructions at home: Medicines   Take over-the-counter and prescription medicines only as told by your health care provider.  If you were prescribed an antibiotic medicine, take it as told by your health care provider. Do not stop taking the antibiotic even if you start to feel better.  Do not drive for 24 hours if you were given a sedative during your procedure.  Ask your health care provider if the medicine prescribed to you: ? Requires you to avoid driving or using heavy machinery. ? Can cause constipation. You may need to take these actions to prevent or treat constipation:  Take over-the-counter or prescription medicines.  Eat foods that are high in fiber, such as beans, whole grains, and fresh fruits and vegetables.  Limit foods that are high in fat and processed sugars, such as fried or sweet foods. Activity  Return to your normal activities as told by your health care provider. Ask your health care provider what activities are safe for you.  Do not lift anything that is heavier than 10 lb (4.5 kg), or the limit that you are told, until your health care provider says that it is safe.  Avoid intense physical activity for as long as told by your health care provider.  Rest as  told by your health care provider.  Avoid sitting for a long time without moving. Get up to take short walks every 1-2 hours. This is important to improve blood flow and breathing. Ask for help if you feel weak or unsteady. General instructions   Do not drink alcohol for as long as told by your health care provider. This is especially important if you are taking prescription pain medicines.  Do not take baths, swim, or use a hot tub until your health care provider approves. Ask your health care provider if you may take showers. You may only be allowed to take sponge baths.  If you have a catheter, follow instructions from your health care provider about caring for your  catheter and your drainage bag.  Drink enough fluid to keep your urine pale yellow.  Wear compression stockings as told by your health care provider. These stockings help to prevent blood clots and reduce swelling in your legs.  Keep all follow-up visits as told by your health care provider. This is important. ? You will need to be followed closely with regular checks of your bladder and urethra (cystoscopies) to make sure that the cancer does not come back. Contact a health care provider if:  You have pain that gets worse or does not improve with medicine.  You have blood in your urine for more than 2 weeks.  You have cloudy or bad-smelling urine.  You become constipated. Signs of constipation may include having: ? Fewer than three bowel movements in a week. ? Difficulty having a bowel movement. ? Stools that are dry, hard, or larger than normal.  You have a fever. Get help right away if:  You have: ? Severe pain. ? Bright red blood in your urine. ? Blood clots in your urine. ? A lot of blood in your urine.  Your catheter has been removed and you are not able to urinate.  You have a catheter in place and the catheter is not draining urine. Summary  After your procedure, it is common to have a small amount of blood in your urine, soreness or mild pain from your catheter, and pain in your lower abdomen.  Take over-the-counter and prescription medicines only as told by your health care provider.  Rest as told by your health care provider. Follow your health care provider's instructions about returning to normal activities. Ask what activities are safe for you.  If you have a catheter, follow instructions from your health care provider about caring for your catheter and your drainage bag.  Get help right away if you cannot urinate, you have severe pain, or you have bright red blood or blood clots in your urine. This information is not intended to replace advice given to you  by your health care provider. Make sure you discuss any questions you have with your health care provider. Document Revised: 10/24/2017 Document Reviewed: 10/24/2017 Elsevier Patient Education  2020 [place] Anesthesia, Adult, Care After This sheet gives you information about how to care for yourself after your procedure. Your health care provider may also give you more specific instructions. If you have problems or questions, contact your health care provider. What can I expect after the procedure? After the procedure, the following side effects are common:  Pain or discomfort at the IV site.  Nausea.  Vomiting.  Sore throat.  Trouble concentrating.  Feeling cold or chills.  Weak or tired.  Sleepiness and fatigue.  Soreness and body aches. These side  effects can affect parts of the body that were not involved in surgery. Follow these instructions at home:  For at least 24 hours after the procedure:  Have a responsible adult stay with you. It is important to have someone help care for you until you are awake and alert.  Rest as needed.  Do not: ? Participate in activities in which you could fall or become injured. ? Drive. ? Use heavy machinery. ? Drink alcohol. ? Take sleeping pills or medicines that cause drowsiness. ? Make important decisions or sign legal documents. ? Take care of children on your own. Eating and drinking  Follow any instructions from your health care provider about eating or drinking restrictions.  When you feel hungry, start by eating small amounts of foods that are soft and easy to digest (bland), such as toast. Gradually return to your regular diet.  Drink enough fluid to keep your urine pale yellow.  If you vomit, rehydrate by drinking water, juice, or clear broth. General instructions  If you have sleep apnea, surgery and certain medicines can increase your risk for breathing problems. Follow instructions from your health  care provider about wearing your sleep device: ? Anytime you are sleeping, including during daytime naps. ? While taking prescription pain medicines, sleeping medicines, or medicines that make you drowsy.  Return to your normal activities as told by your health care provider. Ask your health care provider what activities are safe for you.  Take over-the-counter and prescription medicines only as told by your health care provider.  If you smoke, do not smoke without supervision.  Keep all follow-up visits as told by your health care provider. This is important. Contact a health care provider if:  You have nausea or vomiting that does not get better with medicine.  You cannot eat or drink without vomiting.  You have pain that does not get better with medicine.  You are unable to pass urine.  You develop a skin rash.  You have a fever.  You have redness around your IV site that gets worse. Get help right away if:  You have difficulty breathing.  You have chest pain.  You have blood in your urine or stool, or you vomit blood. Summary  After the procedure, it is common to have a sore throat or nausea. It is also common to feel tired.  Have a responsible adult stay with you for the first 24 hours after general anesthesia. It is important to have someone help care for you until you are awake and alert.  When you feel hungry, start by eating small amounts of foods that are soft and easy to digest (bland), such as toast. Gradually return to your regular diet.  Drink enough fluid to keep your urine pale yellow.  Return to your normal activities as told by your health care provider. Ask your health care provider what activities are safe for you. This information is not intended to replace advice given to you by your health care provider. Make sure you discuss any questions you have with your health care provider. Document Revised: 03/29/2017 Document Reviewed: 11/09/2016 Elsevier  Patient Education  [place].

## 2019-09-03 ENCOUNTER — Encounter (HOSPITAL_COMMUNITY)
Admission: RE | Admit: 2019-09-03 | Discharge: 2019-09-03 | Disposition: A | Discharge status: Home / Self Care | Payer: Medicare Other | Source: Ambulatory Visit | Attending: Urology | Admitting: Urology

## 2019-09-03 ENCOUNTER — Encounter (HOSPITAL_COMMUNITY): Payer: Self-pay

## 2019-09-03 ENCOUNTER — Other Ambulatory Visit: Payer: Self-pay

## 2019-09-03 DIAGNOSIS — Z01812 Encounter for preprocedural laboratory examination: Secondary | ICD-10-CM | POA: Diagnosis not present

## 2019-09-03 HISTORY — DX: Chronic obstructive pulmonary disease, unspecified: J44.9

## 2019-09-03 LAB — CBC WITH DIFFERENTIAL/PLATELET
Abs Immature Granulocytes: 0.01 10*3/uL (ref 0.00–0.07)
Basophils Absolute: 0 10*3/uL (ref 0.0–0.1)
Basophils Relative: 1 %
Eosinophils Absolute: 0 10*3/uL (ref 0.0–0.5)
Eosinophils Relative: 1 %
HCT: 42.5 % (ref 39.0–52.0)
Hemoglobin: 14.1 g/dL (ref 13.0–17.0)
Immature Granulocytes: 0 %
Lymphocytes Relative: 21 %
Lymphs Abs: 0.8 10*3/uL (ref 0.7–4.0)
MCH: 31.5 pg (ref 26.0–34.0)
MCHC: 33.2 g/dL (ref 30.0–36.0)
MCV: 94.9 fL (ref 80.0–100.0)
Monocytes Absolute: 0.3 10*3/uL (ref 0.1–1.0)
Monocytes Relative: 7 %
Neutro Abs: 2.9 10*3/uL (ref 1.7–7.7)
Neutrophils Relative %: 70 %
Platelets: 205 10*3/uL (ref 150–400)
RBC: 4.48 MIL/uL (ref 4.22–5.81)
RDW: 12.1 % (ref 11.5–15.5)
WBC: 4.1 10*3/uL (ref 4.0–10.5)
nRBC: 0 % (ref 0.0–0.2)

## 2019-09-03 LAB — BASIC METABOLIC PANEL
Anion gap: 11 (ref 5–15)
BUN: 14 mg/dL (ref 8–23)
CO2: 19 mmol/L — ABNORMAL LOW (ref 22–32)
Calcium: 8.7 mg/dL — ABNORMAL LOW (ref 8.9–10.3)
Chloride: 107 mmol/L (ref 98–111)
Creatinine, Ser: 0.82 mg/dL (ref 0.61–1.24)
GFR calc Af Amer: >60 mL/min (ref >60)
GFR calc non Af Amer: >60 mL/min (ref >60)
Glucose, Bld: 79 mg/dL (ref 70–99)
Potassium: 3.8 mmol/L (ref 3.5–5.1)
Sodium: 137 mmol/L (ref 135–145)

## 2019-09-08 ENCOUNTER — Other Ambulatory Visit (HOSPITAL_COMMUNITY)
Admission: RE | Admit: 2019-09-08 | Discharge: 2019-09-08 | Disposition: A | Discharge status: Home / Self Care | Payer: Medicare Other | Source: Ambulatory Visit | Attending: Urology | Admitting: Urology

## 2019-09-08 ENCOUNTER — Other Ambulatory Visit: Payer: Self-pay

## 2019-09-08 DIAGNOSIS — Z20822 Contact with and (suspected) exposure to covid-19: Secondary | ICD-10-CM | POA: Diagnosis not present

## 2019-09-08 DIAGNOSIS — Z01812 Encounter for preprocedural laboratory examination: Secondary | ICD-10-CM | POA: Insufficient documentation

## 2019-09-09 ENCOUNTER — Ambulatory Visit (HOSPITAL_COMMUNITY)
Admission: RE | Admit: 2019-09-09 | Discharge: 2019-09-09 | Disposition: A | Discharge status: Home / Self Care | Payer: Medicare Other | Attending: Urology | Admitting: Urology

## 2019-09-09 ENCOUNTER — Ambulatory Visit (HOSPITAL_COMMUNITY): Payer: Medicare Other | Admitting: Anesthesiology

## 2019-09-09 ENCOUNTER — Encounter (HOSPITAL_COMMUNITY)
Admission: RE | Disposition: A | Discharge status: Home / Self Care | Payer: Self-pay | Source: Home / Self Care | Attending: Urology

## 2019-09-09 ENCOUNTER — Ambulatory Visit (HOSPITAL_COMMUNITY): Payer: Medicare Other

## 2019-09-09 ENCOUNTER — Other Ambulatory Visit: Payer: Self-pay

## 2019-09-09 ENCOUNTER — Encounter (HOSPITAL_COMMUNITY): Payer: Self-pay | Admitting: Urology

## 2019-09-09 DIAGNOSIS — D414 Neoplasm of uncertain behavior of bladder: Secondary | ICD-10-CM | POA: Diagnosis not present

## 2019-09-09 DIAGNOSIS — J449 Chronic obstructive pulmonary disease, unspecified: Secondary | ICD-10-CM | POA: Diagnosis not present

## 2019-09-09 DIAGNOSIS — D494 Neoplasm of unspecified behavior of bladder: Secondary | ICD-10-CM

## 2019-09-09 DIAGNOSIS — Z8551 Personal history of malignant neoplasm of bladder: Secondary | ICD-10-CM | POA: Diagnosis not present

## 2019-09-09 DIAGNOSIS — N200 Calculus of kidney: Secondary | ICD-10-CM

## 2019-09-09 HISTORY — PX: TRANSURETHRAL RESECTION OF BLADDER TUMOR: SHX2575

## 2019-09-09 HISTORY — PX: CYSTOSCOPY W/ RETROGRADES: SHX1426

## 2019-09-09 LAB — SARS CORONAVIRUS 2 (TAT 6-24 HRS): SARS Coronavirus 2: NEGATIVE

## 2019-09-09 SURGERY — CYSTOSCOPY, WITH RETROGRADE PYELOGRAM
Anesthesia: General | Site: Ureter

## 2019-09-09 MED ORDER — CEFAZOLIN SODIUM-DEXTROSE 2-4 GM/100ML-% IV SOLN
2.0000 g | INTRAVENOUS | Status: AC
  Administered 2019-09-09: 2 g via INTRAVENOUS
  Filled 2019-09-09: qty 100

## 2019-09-09 MED ORDER — LACTATED RINGERS IV SOLN
Freq: Once | INTRAVENOUS | Status: AC
  Administered 2019-09-09: 1000 mL via INTRAVENOUS

## 2019-09-09 MED ORDER — LIDOCAINE 2% (20 MG/ML) 5 ML SYRINGE
INTRAMUSCULAR | Status: AC
  Filled 2019-09-09: qty 5

## 2019-09-09 MED ORDER — LACTATED RINGERS IV SOLN: INTRAVENOUS | Status: DC | PRN

## 2019-09-09 MED ORDER — OXYCODONE-ACETAMINOPHEN 5-325 MG PO TABS: 1.0000 | ORAL_TABLET | ORAL | 0 refills | Status: DC | PRN

## 2019-09-09 MED ORDER — HYDROMORPHONE HCL 1 MG/ML IJ SOLN: 0.2500 mg | INTRAMUSCULAR | Status: DC | PRN

## 2019-09-09 MED ORDER — STERILE WATER FOR IRRIGATION IR SOLN
Status: DC | PRN
  Administered 2019-09-09: 500 mL

## 2019-09-09 MED ORDER — PROPOFOL 10 MG/ML IV BOLUS
INTRAVENOUS | Status: AC
  Filled 2019-09-09: qty 20

## 2019-09-09 MED ORDER — FENTANYL CITRATE (PF) 100 MCG/2ML IJ SOLN
INTRAMUSCULAR | Status: DC | PRN
  Administered 2019-09-09 (×2): 50 ug via INTRAVENOUS

## 2019-09-09 MED ORDER — FENTANYL CITRATE (PF) 100 MCG/2ML IJ SOLN
INTRAMUSCULAR | Status: AC
  Filled 2019-09-09: qty 2

## 2019-09-09 MED ORDER — ONDANSETRON HCL 4 MG/2ML IJ SOLN
INTRAMUSCULAR | Status: AC
  Filled 2019-09-09: qty 2

## 2019-09-09 MED ORDER — DEXAMETHASONE SODIUM PHOSPHATE 10 MG/ML IJ SOLN
INTRAMUSCULAR | Status: DC | PRN
  Administered 2019-09-09: 5 mg via INTRAVENOUS

## 2019-09-09 MED ORDER — LIDOCAINE HCL (CARDIAC) PF 50 MG/5ML IV SOSY
PREFILLED_SYRINGE | INTRAVENOUS | Status: DC | PRN
  Administered 2019-09-09: 100 mg via INTRAVENOUS

## 2019-09-09 MED ORDER — PROPOFOL 10 MG/ML IV BOLUS
INTRAVENOUS | Status: DC | PRN
  Administered 2019-09-09: 60 mg via INTRAVENOUS
  Administered 2019-09-09: 150 mg via INTRAVENOUS

## 2019-09-09 MED ORDER — ORAL CARE MOUTH RINSE: 15.0000 mL | Freq: Once | OROMUCOSAL | Status: AC

## 2019-09-09 MED ORDER — DIATRIZOATE MEGLUMINE 30 % UR SOLN
URETHRAL | Status: DC | PRN
  Administered 2019-09-09: 18 mL via URETHRAL

## 2019-09-09 MED ORDER — CHLORHEXIDINE GLUCONATE 0.12 % MT SOLN
15.0000 mL | Freq: Once | OROMUCOSAL | Status: AC
  Administered 2019-09-09: 15 mL via OROMUCOSAL
  Filled 2019-09-09: qty 15

## 2019-09-09 MED ORDER — ONDANSETRON HCL 4 MG/2ML IJ SOLN: 4.0000 mg | Freq: Once | INTRAMUSCULAR | Status: DC | PRN

## 2019-09-09 MED ORDER — ONDANSETRON HCL 4 MG/2ML IJ SOLN
INTRAMUSCULAR | Status: DC | PRN
  Administered 2019-09-09: 4 mg via INTRAVENOUS

## 2019-09-09 MED ORDER — DIATRIZOATE MEGLUMINE 30 % UR SOLN
URETHRAL | Status: AC
  Filled 2019-09-09: qty 100

## 2019-09-09 MED ORDER — PHENYLEPHRINE HCL (PRESSORS) 10 MG/ML IV SOLN
INTRAVENOUS | Status: DC | PRN
Start: 2019-09-09 — End: 2019-09-09
  Administered 2019-09-09: 80 ug via INTRAVENOUS

## 2019-09-09 MED ORDER — SODIUM CHLORIDE 0.9 % IR SOLN
Status: DC | PRN
  Administered 2019-09-09: 3000 mL

## 2019-09-09 SURGICAL SUPPLY — 27 items
BAG DRAIN URO TABLE W/ADPT NS (BAG) ×3 IMPLANT
BAG HAMPER (MISCELLANEOUS) ×3 IMPLANT
BAG URINE DRAIN 2000ML AR STRL (UROLOGICAL SUPPLIES) ×3 IMPLANT
CATH FOLEY 3WAY 30CC 22F (CATHETERS) ×1 IMPLANT
CATH INTERMIT  6FR 70CM (CATHETERS) ×3 IMPLANT
CLOTH BEACON ORANGE TIMEOUT ST (SAFETY) ×3 IMPLANT
DECANTER SPIKE VIAL GLASS SM (MISCELLANEOUS) ×3 IMPLANT
ELECT LOOP 22F BIPOLAR SML (ELECTROSURGICAL) ×3
ELECT REM PT RETURN 9FT ADLT (ELECTROSURGICAL) ×3
ELECTRODE LOOP 22F BIPOLAR SML (ELECTROSURGICAL) ×2 IMPLANT
ELECTRODE REM PT RTRN 9FT ADLT (ELECTROSURGICAL) ×2 IMPLANT
GLOVE BIO SURGEON STRL SZ8 (GLOVE) ×3 IMPLANT
GLOVE BIOGEL PI IND STRL 7.0 (GLOVE) ×4 IMPLANT
GLOVE BIOGEL PI INDICATOR 7.0 (GLOVE) ×2
GOWN STRL REUS W/TWL LRG LVL3 (GOWN DISPOSABLE) ×3 IMPLANT
GOWN STRL REUS W/TWL XL LVL3 (GOWN DISPOSABLE) ×3 IMPLANT
GUIDEWIRE ANG ZIPWIRE 038X150 (WIRE) ×1 IMPLANT
IV NS IRRIG 3000ML ARTHROMATIC (IV SOLUTION) ×3 IMPLANT
KIT TURNOVER CYSTO (KITS) ×3 IMPLANT
MANIFOLD NEPTUNE II (INSTRUMENTS) ×3 IMPLANT
PAD ARMBOARD 7.5X6 YLW CONV (MISCELLANEOUS) ×3 IMPLANT
PLUG CATH AND CAP STER (CATHETERS) ×3 IMPLANT
SYR 30ML LL (SYRINGE) ×3 IMPLANT
SYR TOOMEY 50ML (SYRINGE) ×3 IMPLANT
TOWEL OR 17X26 4PK STRL BLUE (TOWEL DISPOSABLE) ×3 IMPLANT
TRAY CYSTO PACK (CUSTOM PROCEDURE TRAY) ×3 IMPLANT
WATER STERILE IRR 500ML POUR (IV SOLUTION) ×3 IMPLANT

## 2019-09-09 NOTE — Progress Notes (Signed)
Daughter [employer]/gallery [employer] stated that [employer] is out of stock of his pain prescription. Requesting if medication prescription can be sent to [facility]. Call to Dr. Alyson Ingles at [phone] and advised Dr. Alyson Ingles of change of prescription and corrected by Dr. Alyson Ingles and sent to [facility] in [place]. Daughter [employer] notified via telephone at [phone] that prescription sent to [facility] at 15:33pm

## 2019-09-09 NOTE — Discharge Instructions (Signed)
Indwelling Urinary Catheter Care, Adult °An indwelling urinary catheter is a thin tube that is put into your bladder. The tube helps to drain pee (urine) out of your body. The tube goes in through your urethra. Your urethra is where pee comes out of your body. Your pee will come out through the catheter, then it will go into a bag (drainage bag). °Take good care of your catheter so it will work well. °How to wear your catheter and bag °Supplies needed °· Sticky tape (adhesive tape) or a leg strap. °· Alcohol wipe or soap and water (if you use tape). °· A clean towel (if you use tape). °· Large overnight bag. °· Smaller bag (leg bag). °Wearing your catheter °Attach your catheter to your leg with tape or a leg strap. °· Make sure the catheter is not pulled tight. °· If a leg strap gets wet, take it off and put on a dry strap. °· If you use tape to hold the bag on your leg: °1. Use an alcohol wipe or soap and water to wash your skin where the tape made it sticky before. °2. Use a clean towel to pat-dry that skin. °3. Use new tape to make the bag stay on your leg. °Wearing your bags °You should have been given a large overnight bag. °· You may wear the overnight bag in the day or night. °· Always have the overnight bag lower than your bladder.  Do not let the bag touch the floor. °· Before you go to sleep, put a clean plastic bag in a wastebasket. Then hang the overnight bag inside the wastebasket. °You should also have a smaller leg bag that fits under your clothes. °· Always wear the leg bag below your knee. °· Do not wear your leg bag at night. °How to care for your skin and catheter °Supplies needed °· A clean washcloth. °· Water and mild soap. °· A clean towel. °Caring for your skin and catheter ° °  ° °· Clean the skin around your catheter every day: °1. Wash your hands with soap and water. °2. Wet a clean washcloth in warm water and mild soap. °3. Clean the skin around your urethra. °§ If you are  male: °§ Gently spread the folds of skin around your vagina (labia). °§ With the washcloth in your other hand, wipe the inner side of your labia on each side. Wipe from front to back. °§ If you are male: °§ Pull back any skin that covers the end of your penis (foreskin). °§ With the washcloth in your other hand, wipe your penis in small circles. Start wiping at the tip of your penis, then move away from the catheter. °§ Move the foreskin back in place, if needed. °4. With your free hand, hold the catheter close to where it goes into your body. °§ Keep holding the catheter during cleaning so it does not get pulled out. °5. With the washcloth in your other hand, clean the catheter. °§ Only wipe downward on the catheter. °§ Do not wipe upward toward your body. Doing this may push germs into your urethra and cause infection. °6. Use a clean towel to pat-dry the catheter and the skin around it. Make sure to wipe off all soap. °7. Wash your hands with soap and water. °· Shower every day. Do not take baths. °· Do not use cream, ointment, or lotion on the area where the catheter goes into your body, unless your doctor tells you   to. °· Do not use powders, sprays, or lotions on your genital area. °· Check your skin around the catheter every day for signs of infection. Check for: °? Redness, swelling, or pain. °? Fluid or blood. °? Warmth. °? Pus or a bad smell. °How to empty the bag °Supplies needed °· Rubbing alcohol. °· Gauze pad or cotton ball. °· Tape or a leg strap. °Emptying the bag °Pour the pee out of your bag when it is ?-½ full, or at least 2-3 times a day. Do this for your overnight bag and your leg bag. °1. Wash your hands with soap and water. °2. Separate (detach) the bag from your leg. °3. Hold the bag over the toilet or a clean pail. Keep the bag lower than your hips and bladder. This is so the pee (urine) does not go back into the tube. °4. Open the pour spout. It is at the bottom of the bag. °5. Empty the  pee into the toilet or pail. Do not let the pour spout touch any surface. °6. Put rubbing alcohol on a gauze pad or cotton ball. °7. Use the gauze pad or cotton ball to clean the pour spout. °8. Close the pour spout. °9. Attach the bag to your leg with tape or a leg strap. °10. Wash your hands with soap and water. °Follow instructions for cleaning the drainage bag: °· From the product maker. °· As told by your doctor. °How to change the bag °Supplies needed °· Alcohol wipes. °· A clean bag. °· Tape or a leg strap. °Changing the bag °Replace your bag when it starts to leak, smell bad, or look dirty. °1. Wash your hands with soap and water. °2. Separate the dirty bag from your leg. °3. Pinch the catheter with your fingers so that pee does not spill out. °4. Separate the catheter tube from the bag tube where these tubes connect (at the connection valve). Do not let the tubes touch any surface. °5. Clean the end of the catheter tube with an alcohol wipe. Use a different alcohol wipe to clean the end of the bag tube. °6. Connect the catheter tube to the tube of the clean bag. °7. Attach the clean bag to your leg with tape or a leg strap. Do not make the bag tight on your leg. °8. Wash your hands with soap and water. °General rules ° °· Never pull on your catheter. Never try to take it out. Doing that can hurt you. °· Always wash your hands before and after you touch your catheter or bag. Use a mild, fragrance-free soap. If you do not have soap and water, use hand sanitizer. °· Always make sure there are no twists or bends (kinks) in the catheter tube. °· Always make sure there are no leaks in the catheter or bag. °· Drink enough fluid to keep your pee pale yellow. °· Do not take baths, swim, or use a hot tub. °· If you are male, wipe from front to back after you poop (have a bowel movement). °Contact a doctor if: °· Your pee is cloudy. °· Your pee smells worse than usual. °· Your catheter gets clogged. °· Your catheter  leaks. °· Your bladder feels full. °Get help right away if: °· You have redness, swelling, or pain where the catheter goes into your body. °· You have fluid, blood, pus, or a bad smell coming from the area where the catheter goes into your body. °· Your skin feels warm where   the catheter goes into your body. °· You have a fever. °· You have pain in your: °? Belly (abdomen). °? Legs. °? Lower back. °? Bladder. °· You see blood in the catheter. °· Your pee is pink or red. °· You feel sick to your stomach (nauseous). °· You throw up (vomit). °· You have chills. °· Your pee is not draining into the bag. °· Your catheter gets pulled out. °Summary °· An indwelling urinary catheter is a thin tube that is placed into the bladder to help drain pee (urine) out of the body. °· The catheter is placed into the part of the body that drains pee from the bladder (urethra). °· Taking good care of your catheter will keep it working properly and help prevent problems. °· Always wash your hands before and after touching your catheter or bag. °· Never pull on your catheter or try to take it out. °This information is not intended to replace advice given to you by your health care provider. Make sure you discuss any questions you have with your health care provider. °Document Revised: 07/18/2018 Document Reviewed: 11/09/2016 °Elsevier Patient Education © 2020 Elsevier Inc. ° ° ° ° °General Anesthesia, Adult, Care After °This sheet gives you information about how to care for yourself after your procedure. Your health care provider may also give you more specific instructions. If you have problems or questions, contact your health care provider. °What can I expect after the procedure? °After the procedure, the following side effects are common: °· Pain or discomfort at the IV site. °· Nausea. °· Vomiting. °· Sore throat. °· Trouble concentrating. °· Feeling cold or chills. °· Weak or tired. °· Sleepiness and fatigue. °· Soreness and body  aches. These side effects can affect parts of the body that were not involved in surgery. °Follow these instructions at home: ° °For at least 24 hours after the procedure: °· Have a responsible adult stay with you. It is important to have someone help care for you until you are awake and alert. °· Rest as needed. °· Do not: °? Participate in activities in which you could fall or become injured. °? Drive. °? Use heavy machinery. °? Drink alcohol. °? Take sleeping pills or medicines that cause drowsiness. °? Make important decisions or sign legal documents. °? Take care of children on your own. °Eating and drinking °· Follow any instructions from your health care provider about eating or drinking restrictions. °· When you feel hungry, start by eating small amounts of foods that are soft and easy to digest (bland), such as toast. Gradually return to your regular diet. °· Drink enough fluid to keep your urine pale yellow. °· If you vomit, rehydrate by drinking water, juice, or clear broth. °General instructions °· If you have sleep apnea, surgery and certain medicines can increase your risk for breathing problems. Follow instructions from your health care provider about wearing your sleep device: °? Anytime you are sleeping, including during daytime naps. °? While taking prescription pain medicines, sleeping medicines, or medicines that make you drowsy. °· Return to your normal activities as told by your health care provider. Ask your health care provider what activities are safe for you. °· Take over-the-counter and prescription medicines only as told by your health care provider. °· If you smoke, do not smoke without supervision. °· Keep all follow-up visits as told by your health care provider. This is important. °Contact a health care provider if: °· You have nausea or vomiting that   does not get better with medicine. °· You cannot eat or drink without vomiting. °· You have pain that does not get better with  medicine. °· You are unable to pass urine. °· You develop a skin rash. °· You have a fever. °· You have redness around your IV site that gets worse. °Get help right away if: °· You have difficulty breathing. °· You have chest pain. °· You have blood in your urine or stool, or you vomit blood. °Summary °· After the procedure, it is common to have a sore throat or nausea. It is also common to feel tired. °· Have a responsible adult stay with you for the first 24 hours after general anesthesia. It is important to have someone help care for you until you are awake and alert. °· When you feel hungry, start by eating small amounts of foods that are soft and easy to digest (bland), such as toast. Gradually return to your regular diet. °· Drink enough fluid to keep your urine pale yellow. °· Return to your normal activities as told by your health care provider. Ask your health care provider what activities are safe for you. °This information is not intended to replace advice given to you by your health care provider. Make sure you discuss any questions you have with your health care provider. °Document Revised: 03/29/2017 Document Reviewed: 11/09/2016 °Elsevier Patient Education © 2020 Elsevier Inc. ° °

## 2019-09-09 NOTE — Interval H&P Note (Signed)
History and Physical Interval Note:  09/09/2019 12:44 PM  Kenneth Reed  has presented today for surgery, with the diagnosis of bladder tumor.  The various methods of treatment have been discussed with the patient and family. After consideration of risks, benefits and other options for treatment, the patient has consented to  Procedure(s): CYSTOSCOPY WITH RETROGRADE PYELOGRAM (Bilateral) TRANSURETHRAL RESECTION OF BLADDER TUMOR (TURBT) (N/A) as a surgical intervention.  The patient's history has been reviewed, patient examined, no change in status, stable for surgery.  I have reviewed the patient's chart and labs.  Questions were answered to the patient's satisfaction.     [name]

## 2019-09-09 NOTE — Transfer of Care (Signed)
Immediate Anesthesia Transfer of Care Note  Patient: Kenneth Reed  Procedure(s) Performed: CYSTOSCOPY WITH RETROGRADE PYELOGRAM (Bilateral Ureter) TRANSURETHRAL RESECTION OF BLADDER TUMOR (TURBT) (N/A Bladder)  Patient Location: PACU  Anesthesia Type:General  Level of Consciousness: awake, alert , oriented and patient cooperative  Airway & Oxygen Therapy: Patient Spontanous Breathing  Post-op Assessment: Report given to RN, Post -op Vital signs reviewed and stable and Patient moving all extremities  Post vital signs: Reviewed and stable  Last Vitals:  Vitals Value Taken Time  BP    Temp    Pulse 98 09/09/19 1414  Resp 22 09/09/19 1414  SpO2 95 % 09/09/19 1414  Vitals shown include unvalidated device data.  Last Pain:  Vitals:   09/09/19 1057  TempSrc: Oral  PainSc: 0-No pain      Patients Stated Pain Goal: 7 ([id number])  Complications: No apparent anesthesia complications

## 2019-09-09 NOTE — Anesthesia Postprocedure Evaluation (Signed)
Anesthesia Post Note  Patient: Kenneth Reed  Procedure(s) Performed: CYSTOSCOPY WITH RETROGRADE PYELOGRAM (Bilateral Ureter) TRANSURETHRAL RESECTION OF BLADDER TUMOR (TURBT) (N/A Bladder)  Patient location during evaluation: PACU Anesthesia Type: General Level of consciousness: awake, oriented, awake and alert and patient cooperative Pain management: pain level controlled Vital Signs Assessment: post-procedure vital signs reviewed and stable Respiratory status: spontaneous breathing, nonlabored ventilation and respiratory function stable Cardiovascular status: blood pressure returned to baseline and stable Postop Assessment: no headache and no backache Anesthetic complications: no     Last Vitals:  Vitals:   09/09/19 1057  BP: 140/89  Resp: 18  Temp: 36.4 C  SpO2: 100%    Last Pain:  Vitals:   09/09/19 1057  TempSrc: Oral  PainSc: 0-No pain                 Tacy Learn

## 2019-09-09 NOTE — Anesthesia Procedure Notes (Signed)
Procedure Name: LMA Insertion Performed by: Shimika Ames L, CRNA Pre-anesthesia Checklist: Patient identified, Emergency Drugs available, Suction available, Patient being monitored and Timeout performed Patient Re-evaluated:Patient Re-evaluated prior to induction Oxygen Delivery Method: Circle system utilized Preoxygenation: Pre-oxygenation with 100% oxygen Induction Type: IV induction LMA: LMA inserted LMA Size: 5.0 Tube secured with: Tape Dental Injury: Teeth and Oropharynx as per pre-operative assessment        

## 2019-09-09 NOTE — Anesthesia Preprocedure Evaluation (Addendum)
Anesthesia Evaluation  Patient identified by MRN, date of birth, ID band Patient awake    Reviewed: Allergy & Precautions, NPO status , Patient's Chart, lab work & pertinent test results  Airway Mallampati: II  TM Distance: >3 FB Neck ROM: Full    Dental  (+) Edentulous Upper, Edentulous Lower, Implants   Pulmonary COPD,  COPD inhaler,    Pulmonary exam normal breath sounds clear to auscultation       Cardiovascular Exercise Tolerance: Good negative cardio ROS Normal cardiovascular exam Rhythm:Regular Rate:Normal     Neuro/Psych negative neurological ROS  negative psych ROS   GI/Hepatic   Endo/Other  negative endocrine ROS  Renal/GU Renal InsufficiencyRenal disease     Musculoskeletal   Abdominal   Peds  Hematology negative hematology ROS (+)   Anesthesia Other Findings   Reproductive/Obstetrics                           Anesthesia Physical Anesthesia Plan  ASA: II  Anesthesia Plan: General   Post-op Pain Management:    Induction: Intravenous  PONV Risk Score and Plan: 4 or greater and Ondansetron, Dexamethasone and Midazolam  Airway Management Planned: LMA  Additional Equipment:   Intra-op Plan:   Post-operative Plan: Extubation in OR  Informed Consent: I have reviewed the patients History and Physical, chart, labs and discussed the procedure including the risks, benefits and alternatives for the proposed anesthesia with the patient or authorized representative who has indicated his/her understanding and acceptance.       Plan Discussed with: CRNA and Surgeon  Anesthesia Plan Comments:        Anesthesia Quick Evaluation

## 2019-09-09 NOTE — Op Note (Signed)
.  Preoperative diagnosis: bladder tumor  Postoperative diagnosis: Same  Procedure: 1 cystoscopy 2. bilateral retrograde pyelography 3.  Intraoperative fluoroscopy, under one hour, with interpretation 4.  Clot evacuation 5. Transurethral resection of bladder tumor, small  Attending: Rosie Fate  Anesthesia: General  Estimated blood loss: Minimal  Drains: 22 French foley  Specimens: left bladder neck tumor  Antibiotics: ancef  Findings:  1cm papillary left lateral wall/bladder neck tumor.  Ureteral orifices in normal anatomic location. No hydronephrosis or filling defects in either collecting system  Indications: Patient is a 74 year old male with a history of bladder tumor and gross hematuria.  After discussing treatment options, they decided proceed with transurethral resection of a bladder tumor.  Procedure her in detail: The patient was brought to the operating room and a brief timeout was done to ensure correct patient, correct procedure, correct site.  General anesthesia was administered patient was placed in dorsal lithotomy position.  Their genitalia was then prepped and draped in usual sterile fashion.  A rigid 33 French cystoscope was passed in the urethra and the bladder.  Bladder was inspected and we noted  a 1cm bladder tumor.  the ureteral orifices were in the normal orthotopic locations.  a 6 french ureteral catheter was then instilled into the left ureteral orifice.  a gentle retrograde was obtained and findings noted above. We then turned our attention to the right side. a 6 french ureteral catheter was then instilled into the right ureteral orifice.  a gentle retrograde was obtained and findings noted above. We then removed the cystoscope and placed a resectoscope into the bladder. We proceeded to remove the large clot burden from the bladder. Once this was complete we turned our attention to the bladder tumor. Using the bipolar resectoscope we removed the bladder tumor  down to the base. A subsequent muscle deep biopsy was then taken. Hemostasis was then obtained with electrocautery. We then removed the bladder tumor chips and sent them for pathology. We then re-inspected the bladder and found no residula bleeding.  the bladder was then drained, a 22 French foley was placed and this concluded the procedure which was well tolerated by patient.  Complications: None  Condition: Stable, extubated, transferred to PACU  Plan: Patient is to be discharged home and followup in 5 days for foley catheter removal and pathology discussion.

## 2019-09-10 ENCOUNTER — Telehealth: Payer: Self-pay

## 2019-09-10 NOTE — Telephone Encounter (Signed)
Pt. came by office today and received myrbetriq samples due to leakage around cath per Dr. Noland Fordyce instructions. A leg bag was also given per pt. request.

## 2019-09-11 LAB — SURGICAL PATHOLOGY

## 2019-09-15 ENCOUNTER — Ambulatory Visit (INDEPENDENT_AMBULATORY_CARE_PROVIDER_SITE_OTHER): Payer: Medicare Other

## 2019-09-15 ENCOUNTER — Other Ambulatory Visit: Payer: Self-pay

## 2019-09-15 DIAGNOSIS — Z8551 Personal history of malignant neoplasm of bladder: Secondary | ICD-10-CM | POA: Diagnosis not present

## 2019-09-15 NOTE — Addendum Note (Signed)
Addendum  created 09/15/19 1221 by Ollen Bowl, CRNA   Charge Capture section accepted

## 2019-09-15 NOTE — Progress Notes (Signed)
Fill and Pull Catheter Removal  Patient is present today for a catheter removal.  Patient was cleaned and prepped in a sterile fashion 116ml of sterile water/ saline was instilled into the bladder when the patient felt the urge to urinate. 24ml of water was then drained from the balloon.  A 16FR foley cath was removed from the bladder no complications were noted .  Patient as then given some time to void on their own.  Patient can void  186ml on their own after some time.  Patient tolerated well.  Performed by: Antionette Char, Zaylen Susman,lpn

## 2019-09-23 ENCOUNTER — Ambulatory Visit (INDEPENDENT_AMBULATORY_CARE_PROVIDER_SITE_OTHER): Payer: Medicare Other | Admitting: Urology

## 2019-09-23 ENCOUNTER — Other Ambulatory Visit: Payer: Self-pay

## 2019-09-23 ENCOUNTER — Encounter: Payer: Self-pay | Admitting: Urology

## 2019-09-23 VITALS — BP 108/62 | HR 96 | Temp 98.6°F | Ht 71.0 in | Wt 135.0 lb

## 2019-09-23 DIAGNOSIS — Z8551 Personal history of malignant neoplasm of bladder: Secondary | ICD-10-CM | POA: Diagnosis not present

## 2019-09-23 NOTE — Progress Notes (Signed)
09/23/2019 10:07 AM   Kenneth Reed March 01, 1946 [id number]  Referring provider: Sharion Balloon, [address] [place] [address],  [place] [address]  Followup after bladder tumor resection  HPI: Kenneth Reed is a 74yo here for followup after bladder tumor resection on 6/2. Pathology reviewed with the patient and revealed papillary neoplasm of low malignant potential. No suprapubic pain. No hematuria or dysuria. NO urinary frequency. He has nocturia for which he takes flomax 0.4mg  daily   PMH: Past Medical History:  Diagnosis Date  . Bladder cancer [facility]) 2010   Dr. Maryland Pink operated; no need for chemo; recurred locally  in 2013 locally.    Marland Kitchen BPH (benign prostatic hyperplasia)   . Cancer ([place]) 03/26/12   High Grade NonHodgkin's B- Cell Lymphoma  . Colon cancer [facility]) 2010   Dr. Anthony Sar operated; stage II.  No need for chemo.  Marland Kitchen COPD (chronic obstructive pulmonary disease) ([place])   . Fatigue   . History of colon cancer 01/07/2013  . Non-Hodgkin's lymphoma of head ([place]) 2014  . Status post chemotherapy 04/18/2012 - 06/20/12    4 Cycles of R-CHOP with 3 Doses of Prophylactic Intrathecal chemo.  . Weight loss 07/11/12 note    Surgical History: Past Surgical History:  Procedure Laterality Date  . Biospy Left Tonsil  03/26/12   Non - Hodgkins B-Cell Lympyhoma  . BLADDER SURGERY  2010  . COLECTOMY  2010  . COLONOSCOPY  2012   next one 2014.   . CYSTOSCOPY W/ RETROGRADES Bilateral 09/09/2019   Procedure: CYSTOSCOPY WITH RETROGRADE PYELOGRAM;  Surgeon: Cleon Gustin, MD;  Location: [facility];  Service: Urology;  Laterality: Bilateral;  . HAND TENDON SURGERY Bilateral   . NASAL POLYP SURGERY  1983  . TRANSURETHRAL RESECTION OF BLADDER TUMOR N/A 09/09/2019   Procedure: TRANSURETHRAL RESECTION OF BLADDER TUMOR (TURBT);  Surgeon: Cleon Gustin, MD;  Location: [facility];  Service: Urology;  Laterality: N/A;    Home Medications:  Allergies as of 09/23/2019   No Known Allergies      Medication List       Accurate as of September 23, 2019 10:07 AM. If you have any questions, ask your nurse or doctor.        acetaminophen 500 MG tablet Commonly known as: TYLENOL Take 1,000 mg by mouth every 6 (six) hours as needed for moderate pain.   albuterol 108 (90 Base) MCG/ACT inhaler Commonly known as: VENTOLIN HFA Inhale 2 puffs into the lungs every 4 (four) hours as needed.   budesonide-formoterol 160-4.5 MCG/ACT inhaler Commonly known as: SYMBICORT Inhale 2 puffs into the lungs 2 (two) times daily.   finasteride 5 MG tablet Commonly known as: PROSCAR Take 5 mg by mouth daily.   fluticasone 50 MCG/ACT nasal spray Commonly known as: FLONASE Place 1 spray into both nostrils 2 (two) times daily as needed for allergies or rhinitis.   oxyCODONE-acetaminophen 5-325 MG tablet Commonly known as: Percocet Take 1 tablet by mouth every 4 (four) hours as needed for moderate pain or severe pain.   tamsulosin 0.4 MG Caps capsule Commonly known as: FLOMAX Take 0.4 mg by mouth daily.       Allergies: No Known Allergies  Family History: Family History  Problem Relation Age of Onset  . Cancer Father        leukemia  . Cancer Maternal Aunt        leukemia  . Cancer Paternal Grandmother        leukemia  .  Narcolepsy Daughter     Social History:  reports that he has never smoked. He quit smokeless tobacco use about 11 years ago.  His smokeless tobacco use included chew. He reports current alcohol use of about 2.0 standard drinks of alcohol per week. He reports that he does not use drugs.  ROS: All other review of systems were reviewed and are negative except what is noted above in HPI  Physical Exam: BP 108/62   Pulse 96   Temp 98.6 F (37 C)   Ht 5\' 11"  (1.803 m)   Wt 135 lb (61.2 kg)   BMI 18.83 kg/m   Constitutional:  Alert and oriented, No acute distress. HEENT: [place] AT, moist mucus membranes.  Trachea midline, no masses. Cardiovascular: No clubbing,  cyanosis, or edema. Respiratory: Normal respiratory effort, no increased work of breathing. GI: Abdomen is soft, nontender, nondistended, no abdominal masses GU: No CVA tenderness.  Lymph: No cervical or inguinal lymphadenopathy. Skin: No rashes, bruises or suspicious lesions. Neurologic: Grossly intact, no focal deficits, moving all 4 extremities. Psychiatric: Normal mood and affect.  Laboratory Data: Lab Results  Component Value Date   WBC 4.1 09/03/2019   HGB 14.1 09/03/2019   HCT 42.5 09/03/2019   MCV 94.9 09/03/2019   PLT 205 09/03/2019    Lab Results  Component Value Date   CREATININE 0.82 09/03/2019    No results found for: PSA  No results found for: TESTOSTERONE  No results found for: HGBA1C  Urinalysis    Component Value Date/Time   COLORURINE YELLOW 09/19/2013 1635   APPEARANCEUR Clear 07/17/2017 0843   LABSPEC 1.012 09/19/2013 1635   PHURINE 6.0 09/19/2013 1635   GLUCOSEU Negative 07/17/2017 0843   HGBUR NEGATIVE 09/19/2013 1635   BILIRUBINUR neg 08/19/2019 1427   BILIRUBINUR Negative 07/17/2017 0843   KETONESUR 40 (A) 09/19/2013 1635   PROTEINUR Negative 08/19/2019 1427   PROTEINUR Trace (A) 07/17/2017 0843   PROTEINUR NEGATIVE 09/19/2013 1635   UROBILINOGEN negative (A) 08/19/2019 1427   UROBILINOGEN 1.0 09/19/2013 1635   NITRITE neg 08/19/2019 1427   NITRITE Negative 07/17/2017 0843   NITRITE NEGATIVE 09/19/2013 1635   LEUKOCYTESUR Negative 08/19/2019 1427   LEUKOCYTESUR Negative 07/17/2017 0843    Lab Results  Component Value Date   LABMICR See below: 01/22/2017   WBCUA None seen 01/22/2017   RBCUA None seen 01/22/2017   LABEPIT None seen 01/22/2017   MUCUS mod 06/25/2014   BACTERIA None seen 01/22/2017    Pertinent Imaging:  No results found for this or any previous visit.  No results found for this or any previous visit.  No results found for this or any previous visit.  No results found for this or any previous visit.  No  results found for this or any previous visit.  No results found for this or any previous visit.  No results found for this or any previous visit.  No results found for this or any previous visit.   Assessment & Plan:    1. History of bladder cancer -We discussed the natural hx of PNLMP and the followup regiment. I will see him back in 3 months for cystoscopy   No follow-ups on file.  [name], MD  [facility] [place]

## 2019-09-23 NOTE — Progress Notes (Signed)
Urological Symptom Review  Patient is experiencing the following symptoms: Frequent urination Burning/pain with urination Get up at night to urinate   Review of Systems  Gastrointestinal (upper)  : Negative for upper GI symptoms  Gastrointestinal (lower) : Negative for lower GI symptoms  Constitutional : Negative for symptoms  Skin: Negative for skin symptoms  Eyes: Negative for eye symptoms  Ear/Nose/Throat : Negative for Ear/Nose/Throat symptoms  Hematologic/Lymphatic: Negative for Hematologic/Lymphatic symptoms  Cardiovascular : Negative for cardiovascular symptoms  Respiratory : Negative for respiratory symptoms  Endocrine: Negative for endocrine symptoms  Musculoskeletal: Negative for musculoskeletal symptoms  Neurological: Negative for neurological symptoms  Psychologic: Negative for psychiatric symptoms  

## 2019-09-23 NOTE — Patient Instructions (Signed)
Bladder Cancer  Bladder cancer is an abnormal growth of tissue in the bladder. The bladder is the balloon-like sac in the pelvis. It collects and stores urine that comes from the kidneys through the ureters. The bladder wall is made of layers. If cancer spreads into these layers and through the wall of the bladder, it becomes more difficult to treat. What are the causes? The cause of this condition is not known. What increases the risk? The following factors may make you more likely to develop this condition:  Smoking.  Workplace risks (occupational exposures), such as rubber, leather, textile, dyes, chemicals, and paint.  Being white.  Your age. Most people with bladder cancer are over the age of 55.  Being male.  Having chronic bladder inflammation.  Having a personal history of bladder cancer.  Having a family history of bladder cancer (heredity).  Having had chemotherapy or radiation therapy to the pelvis.  Having been exposed to arsenic. What are the signs or symptoms? Initial symptoms of this condition include:  Blood in the urine.  Painful urination.  Frequent bladder or urine infections.  Increase in urgency and frequency of urination. Advanced symptoms of this condition include:  Not being able to urinate.  Low back pain on one side.  Loss of appetite.  Weight loss.  Fatigue.  Swelling in the feet.  Bone pain. How is this diagnosed? This condition is diagnosed based on your medical history, a physical exam, urine tests, lab tests, imaging tests, and your symptoms. You may also have other tests or procedures done, such as:  A narrow tube being inserted into your bladder through your urethra (cystoscopy) in order to view the lining of your bladder for tumors.  A biopsy to sample the tumor to see if cancer is present. If cancer is present, it will then be staged to determine its severity and extent. Staging is an assessment of:  The size of the  tumor.  Whether the cancer has spread.  Where the cancer has spread. It is important to know how deeply into the bladder wall cancer has grown and whether cancer has spread to any other parts of your body. Staging may require blood tests or imaging tests, such as a CT scan, MRI, bone scan, or chest X-ray. How is this treated? Based on the stage of cancer, one treatment or a combination of treatments may be recommended. The most common forms of treatment are:  Surgery to remove the cancer. Procedures that may be done include transurethral resection and cystectomy.  Radiation therapy. This is high-energy X-rays or other particles. This is often used in combination with chemotherapy.  Chemotherapy. During this treatment, medicines are used to kill cancer cells.  Immunotherapy. This uses medicines to help your own immune system destroy cancer cells. Follow these instructions at home:  Take over-the-counter and prescription medicines only as told by your health care provider.  Maintain a healthy diet. Some of your treatments might affect your appetite.  Consider joining a support group. This may help you learn to cope with the stress of having bladder cancer.  Tell your cancer care team if you develop side effects. They may be able to recommend ways to relieve them.  Keep all follow-up visits as told by your health care provider. This is important. Where to find more information  American Cancer Society: www.cancer.org  National Cancer Institute (NCI): www.cancer.gov Contact a health care provider if:  You have symptoms of a urinary tract infection. These include: ?   Fever. ? Chills. ? Weakness. ? Muscle aches. ? Abdominal pain. ? Frequent and intense urge to urinate. ? Burning feeling in the bladder or urethra during urination. Get help right away if:  There is blood in your urine.  You cannot urinate.  You have severe pain or other symptoms that do not go  away. Summary  Bladder cancer is an abnormal growth of tissue in the bladder.  This condition is diagnosed based on your medical history, a physical exam, urine tests, lab tests, imaging tests, and your symptoms.  Based on the stage of cancer, surgery, chemotherapy, or a combination of treatments may be recommended.  Consider joining a support group. This may help you learn to cope with the stress of having bladder cancer. This information is not intended to replace advice given to you by your health care provider. Make sure you discuss any questions you have with your health care provider. Document Revised: 03/08/2017 Document Reviewed: 02/28/2016 Elsevier Patient Education  2020 Elsevier Inc.  

## 2019-12-21 ENCOUNTER — Other Ambulatory Visit: Payer: Self-pay

## 2019-12-21 ENCOUNTER — Ambulatory Visit (INDEPENDENT_AMBULATORY_CARE_PROVIDER_SITE_OTHER): Payer: Medicare Other | Admitting: Urology

## 2019-12-21 ENCOUNTER — Encounter: Payer: Self-pay | Admitting: Urology

## 2019-12-21 VITALS — BP 148/77 | HR 87 | Temp 97.5°F | Ht 71.0 in | Wt 135.0 lb

## 2019-12-21 DIAGNOSIS — N3000 Acute cystitis without hematuria: Secondary | ICD-10-CM | POA: Diagnosis not present

## 2019-12-21 DIAGNOSIS — Z8551 Personal history of malignant neoplasm of bladder: Secondary | ICD-10-CM | POA: Diagnosis not present

## 2019-12-21 LAB — URINALYSIS, ROUTINE W REFLEX MICROSCOPIC
Bilirubin, UA: NEGATIVE
Glucose, UA: NEGATIVE
Nitrite, UA: POSITIVE — AB
Specific Gravity, UA: 1.025 (ref 1.005–1.030)
Urobilinogen, Ur: 0.2 mg/dL (ref 0.2–1.0)
pH, UA: 7 (ref 5.0–7.5)

## 2019-12-21 LAB — MICROSCOPIC EXAMINATION
Epithelial Cells (non renal): NONE SEEN /hpf (ref 0–10)
Renal Epithel, UA: NONE SEEN /hpf
WBC, UA: >30 /hpf — AB (ref 0–5)

## 2019-12-21 MED ORDER — CIPROFLOXACIN HCL 500 MG PO TABS: 500.0000 mg | ORAL_TABLET | Freq: Once | ORAL | Status: DC

## 2019-12-21 MED ORDER — CIPROFLOXACIN HCL 500 MG PO TABS
500.0000 mg | ORAL_TABLET | Freq: Once | ORAL | 0 refills | Status: AC
Start: 2019-12-21 — End: 2019-12-21

## 2019-12-21 MED ORDER — TAMSULOSIN HCL 0.4 MG PO CAPS: 0.4000 mg | ORAL_CAPSULE | Freq: Two times a day (BID) | ORAL | 11 refills | Status: DC

## 2019-12-21 MED ORDER — SULFAMETHOXAZOLE-TRIMETHOPRIM 800-160 MG PO TABS: 1.0000 | ORAL_TABLET | Freq: Two times a day (BID) | ORAL | 0 refills | Status: DC

## 2019-12-21 NOTE — Progress Notes (Signed)
Attempted to call pt multiple times to explain what one Cipro was sent in for. I lft mes  on his VM explaining and told to call  If any questions.

## 2019-12-21 NOTE — Progress Notes (Signed)
Urological Symptom Review  Patient is experiencing the following symptoms: Frequent urination Hard to postpone urination Get up at night to urinate Stream starts and stops Trouble starting stream Have to strain to urinate   Review of Systems  Gastrointestinal (upper)  : Negative for upper GI symptoms  Gastrointestinal (lower) : Negative for lower GI symptoms  Constitutional : Negative for symptoms  Skin: Negative for skin symptoms  Eyes: Negative for eye symptoms  Ear/Nose/Throat : Negative for Ear/Nose/Throat symptoms  Hematologic/Lymphatic: Negative for Hematologic/Lymphatic symptoms  Cardiovascular : Negative for cardiovascular symptoms  Respiratory : Negative for respiratory symptoms  Endocrine: Negative for endocrine symptoms  Musculoskeletal: Back pain  Neurological: Negative for neurological symptoms  Psychologic: Negative for psychiatric symptoms

## 2019-12-21 NOTE — Progress Notes (Signed)
Pt did not take I Cipro tab after cysto in office. Could not get pt on phone. Rx was sent to pts rx for 1 Cipro tab to be taken.

## 2019-12-21 NOTE — Progress Notes (Signed)
12/21/2019 10:06 AM   Kenneth Reed 28-Mar-1946 [id number]  Referring provider: Sharion Balloon, [address] [place],  [place] [address]  Right testicular swelling  HPI: [name] is a 74yo here for followup for bladder cancer. 1 week ago he developed new right testicular swelling with associated cloudy urine. No dysuria or hematuria. He has stable urinary urgency, frequency, nocturia 1-2x, and a weaker stream.   PMH: Past Medical History:  Diagnosis Date  . Bladder cancer [facility]) 2010   Dr. Maryland Pink operated; no need for chemo; recurred locally  in 2013 locally.    Marland Kitchen BPH (benign prostatic hyperplasia)   . Cancer ([place]) 03/26/12   High Grade NonHodgkin's B- Cell Lymphoma  . Colon cancer [facility]) 2010   Dr. Anthony Sar operated; stage II.  No need for chemo.  Marland Kitchen COPD (chronic obstructive pulmonary disease) ([place])   . Fatigue   . History of colon cancer 01/07/2013  . Kidney stone   . Non-Hodgkin's lymphoma of head ([place]) 2014  . Status post chemotherapy 04/18/2012 - 06/20/12    4 Cycles of R-CHOP with 3 Doses of Prophylactic Intrathecal chemo.  . Weight loss 07/11/12 note    Surgical History: Past Surgical History:  Procedure Laterality Date  . Biospy Left Tonsil  03/26/12   Non - Hodgkins B-Cell Lympyhoma  . BLADDER SURGERY  2010  . COLECTOMY  2010  . COLONOSCOPY  2012   next one 2014.   . CYSTOSCOPY W/ RETROGRADES Bilateral 09/09/2019   Procedure: CYSTOSCOPY WITH RETROGRADE PYELOGRAM;  Surgeon: Cleon Gustin, MD;  Location: [facility];  Service: Urology;  Laterality: Bilateral;  . HAND TENDON SURGERY Bilateral   . NASAL POLYP SURGERY  1983  . TRANSURETHRAL RESECTION OF BLADDER TUMOR N/A 09/09/2019   Procedure: TRANSURETHRAL RESECTION OF BLADDER TUMOR (TURBT);  Surgeon: Cleon Gustin, MD;  Location: [facility];  Service: Urology;  Laterality: N/A;    Home Medications:  Allergies as of 12/21/2019   No Known Allergies     Medication List       Accurate as of  December 21, 2019 10:06 AM. If you have any questions, ask your nurse or doctor.        acetaminophen 500 MG tablet Commonly known as: TYLENOL Take 1,000 mg by mouth every 6 (six) hours as needed for moderate pain.   albuterol 108 (90 Base) MCG/ACT inhaler Commonly known as: VENTOLIN HFA Inhale 2 puffs into the lungs every 4 (four) hours as needed.   budesonide-formoterol 160-4.5 MCG/ACT inhaler Commonly known as: SYMBICORT Inhale 2 puffs into the lungs 2 (two) times daily.   finasteride 5 MG tablet Commonly known as: PROSCAR Take 5 mg by mouth daily.   fluticasone 50 MCG/ACT nasal spray Commonly known as: FLONASE Place 1 spray into both nostrils 2 (two) times daily as needed for allergies or rhinitis.   oxyCODONE-acetaminophen 5-325 MG tablet Commonly known as: Percocet Take 1 tablet by mouth every 4 (four) hours as needed for moderate pain or severe pain.   sulfamethoxazole-trimethoprim 800-160 MG tablet Commonly known as: BACTRIM DS Take 1 tablet by mouth every 12 (twelve) hours. Started by: [name], MD   tamsulosin 0.4 MG Caps capsule Commonly known as: FLOMAX Take 1 capsule (0.4 mg total) by mouth in the morning and at bedtime. What changed: when to take this Changed by: [name], MD       Allergies: No Known Allergies  Family History: Family History  Problem Relation Age of  Onset  . Cancer Father        leukemia  . Cancer Maternal Aunt        leukemia  . Cancer Paternal Grandmother        leukemia  . Narcolepsy Daughter     Social History:  reports that he has never smoked. He quit smokeless tobacco use about 11 years ago.  His smokeless tobacco use included chew. He reports current alcohol use of about 2.0 standard drinks of alcohol per week. He reports that he does not use drugs.  ROS: All other review of systems were reviewed and are negative except what is noted above in HPI  Physical Exam: BP (!) 148/77   Pulse 87   Temp (!)  97.5 F (36.4 C)   Ht 5\' 11"  (1.803 m)   Wt 135 lb (61.2 kg)   BMI 18.83 kg/m   Constitutional:  Alert and oriented, No acute distress. HEENT: [place] AT, moist mucus membranes.  Trachea midline, no masses. Cardiovascular: No clubbing, cyanosis, or edema. Respiratory: Normal respiratory effort, no increased work of breathing. GI: Abdomen is soft, nontender, nondistended, no abdominal masses GU: No CVA tenderness. Circumcised phallus. No masses/lesions on penis, testis, scrotum. Prostate 40g smooth no nodules no induration.  Lymph: No cervical or inguinal lymphadenopathy. Skin: No rashes, bruises or suspicious lesions. Neurologic: Grossly intact, no focal deficits, moving all 4 extremities. Psychiatric: Normal mood and affect.  Laboratory Data: Lab Results  Component Value Date   WBC 4.1 09/03/2019   HGB 14.1 09/03/2019   HCT 42.5 09/03/2019   MCV 94.9 09/03/2019   PLT 205 09/03/2019    Lab Results  Component Value Date   CREATININE 0.82 09/03/2019    No results found for: PSA  No results found for: TESTOSTERONE  No results found for: HGBA1C  Urinalysis    Component Value Date/Time   COLORURINE YELLOW 09/19/2013 1635   APPEARANCEUR Clear 07/17/2017 0843   LABSPEC 1.012 09/19/2013 1635   PHURINE 6.0 09/19/2013 1635   GLUCOSEU Negative 07/17/2017 0843   HGBUR NEGATIVE 09/19/2013 1635   BILIRUBINUR neg 08/19/2019 1427   BILIRUBINUR Negative 07/17/2017 0843   KETONESUR 40 (A) 09/19/2013 1635   PROTEINUR Negative 08/19/2019 1427   PROTEINUR Trace (A) 07/17/2017 0843   PROTEINUR NEGATIVE 09/19/2013 1635   UROBILINOGEN negative (A) 08/19/2019 1427   UROBILINOGEN 1.0 09/19/2013 1635   NITRITE neg 08/19/2019 1427   NITRITE Negative 07/17/2017 0843   NITRITE NEGATIVE 09/19/2013 1635   LEUKOCYTESUR Negative 08/19/2019 1427   LEUKOCYTESUR Negative 07/17/2017 0843    Lab Results  Component Value Date   LABMICR See below: 01/22/2017   WBCUA None seen 01/22/2017   RBCUA  None seen 01/22/2017   LABEPIT None seen 01/22/2017   MUCUS mod 06/25/2014   BACTERIA None seen 01/22/2017    Pertinent Imaging:  No results found for this or any previous visit.  No results found for this or any previous visit.  No results found for this or any previous visit.  No results found for this or any previous visit.  No results found for this or any previous visit.  No results found for this or any previous visit.  No results found for this or any previous visit.  No results found for this or any previous visit.   HPI:  Blood pressure (!) 148/77, pulse 87, temperature (!) 97.5 F (36.4 C), height 5\' 11"  (1.803 m), weight 135 lb (61.2 kg). NED. A&Ox3.   No respiratory distress  Abd soft, NT, ND Normal phallus with bilateral descended testicles  Cystoscopy Procedure Note  Patient identification was confirmed, informed consent was obtained, and patient was prepped using Betadine solution.  Lidocaine jelly was administered per urethral meatus.     Pre-Procedure: - Inspection reveals a normal caliber ureteral meatus.  Procedure: The flexible cystoscope was introduced without difficulty - No urethral strictures/lesions are present. - Enlarged prostate  - Normal bladder neck - Bilateral ureteral orifices identified - Bladder mucosa  reveals no ulcers, tumors, or lesions - No bladder stones - Moderate trabeculation  Retroflexion shows No intravesical prostatic protrusion   Post-Procedure: - Patient tolerated the procedure well    Assessment & Plan:    1. History of bladder cancer -RTC 6 months for cystoscopy - Urinalysis, Routine w reflex microscopic - ciprofloxacin (CIPRO) tablet 500 mg  2. Acute cystitis without hematuria -increase flomax to BID - Urine culture - sulfamethoxazole-trimethoprim (BACTRIM DS) 800-160 MG tablet; Take 1 tablet by mouth every 12 (twelve) hours.  Dispense: 14 tablet; Refill: 0   No follow-ups on file.  [name], MD  [facility] [place]

## 2019-12-24 ENCOUNTER — Other Ambulatory Visit: Payer: Medicare Other | Admitting: Urology

## 2019-12-28 LAB — URINE CULTURE

## 2019-12-29 NOTE — Progress Notes (Signed)
Sent via mychart

## 2020-01-19 ENCOUNTER — Telehealth: Payer: Self-pay

## 2020-01-20 ENCOUNTER — Other Ambulatory Visit: Payer: Self-pay

## 2020-01-20 DIAGNOSIS — N3000 Acute cystitis without hematuria: Secondary | ICD-10-CM

## 2020-01-20 MED ORDER — SULFAMETHOXAZOLE-TRIMETHOPRIM 800-160 MG PO TABS: 1.0000 | ORAL_TABLET | Freq: Two times a day (BID) | ORAL | 0 refills | Status: DC

## 2020-01-20 NOTE — Telephone Encounter (Signed)
Bactrim DS 1 po bid x 7 days sent in  per Dr. Alyson Ingles.

## 2020-02-04 ENCOUNTER — Telehealth: Payer: Self-pay

## 2020-02-16 ENCOUNTER — Other Ambulatory Visit: Payer: Self-pay

## 2020-02-16 ENCOUNTER — Ambulatory Visit (INDEPENDENT_AMBULATORY_CARE_PROVIDER_SITE_OTHER): Payer: Medicare Other | Admitting: Nurse Practitioner

## 2020-02-16 ENCOUNTER — Encounter: Payer: Self-pay | Admitting: Nurse Practitioner

## 2020-02-16 VITALS — BP 152/90 | HR 84 | Temp 97.2°F | Resp 20 | Ht 71.0 in | Wt 136.5 lb

## 2020-02-16 DIAGNOSIS — R7989 Other specified abnormal findings of blood chemistry: Secondary | ICD-10-CM | POA: Diagnosis not present

## 2020-02-16 DIAGNOSIS — E7800 Pure hypercholesterolemia, unspecified: Secondary | ICD-10-CM

## 2020-02-16 DIAGNOSIS — R899 Unspecified abnormal finding in specimens from other organs, systems and tissues: Secondary | ICD-10-CM

## 2020-02-16 DIAGNOSIS — E785 Hyperlipidemia, unspecified: Secondary | ICD-10-CM | POA: Insufficient documentation

## 2020-02-16 LAB — CBC WITH DIFFERENTIAL/PLATELET
Basophils Absolute: 0 10*3/uL (ref 0.0–0.2)
Basos: 1 %
EOS (ABSOLUTE): 0.1 10*3/uL (ref 0.0–0.4)
Eos: 2 %
Hematocrit: 47 % (ref 37.5–51.0)
Hemoglobin: 15.8 g/dL (ref 13.0–17.7)
Immature Grans (Abs): 0 10*3/uL (ref 0.0–0.1)
Immature Granulocytes: 0 %
Lymphocytes Absolute: 1 10*3/uL (ref 0.7–3.1)
Lymphs: 30 %
MCH: 31.5 pg (ref 26.6–33.0)
MCHC: 33.6 g/dL (ref 31.5–35.7)
MCV: 94 fL (ref 79–97)
Monocytes Absolute: 0.2 10*3/uL (ref 0.1–0.9)
Monocytes: 7 %
Neutrophils Absolute: 2.1 10*3/uL (ref 1.4–7.0)
Neutrophils: 60 %
Platelets: 226 10*3/uL (ref 150–450)
RBC: 5.01 x10E6/uL (ref 4.14–5.80)
RDW: 12.1 % (ref 11.6–15.4)
WBC: 3.5 10*3/uL (ref 3.4–10.8)

## 2020-02-16 LAB — COMPREHENSIVE METABOLIC PANEL
ALT: 12 IU/L (ref 0–44)
AST: 17 IU/L (ref 0–40)
Albumin/Globulin Ratio: 1.9 (ref 1.2–2.2)
Albumin: 4.8 g/dL — ABNORMAL HIGH (ref 3.7–4.7)
Alkaline Phosphatase: 74 IU/L (ref 44–121)
BUN/Creatinine Ratio: 11 (ref 10–24)
BUN: 11 mg/dL (ref 8–27)
Bilirubin Total: 0.8 mg/dL (ref 0.0–1.2)
CO2: 23 mmol/L (ref 20–29)
Calcium: 9.7 mg/dL (ref 8.6–10.2)
Chloride: 102 mmol/L (ref 96–106)
Creatinine, Ser: 1.03 mg/dL (ref 0.76–1.27)
GFR calc Af Amer: 81 mL/min/{1.73_m2} (ref >59)
GFR calc non Af Amer: 70 mL/min/{1.73_m2} (ref >59)
Globulin, Total: 2.5 g/dL (ref 1.5–4.5)
Glucose: 105 mg/dL — ABNORMAL HIGH (ref 65–99)
Potassium: 4.5 mmol/L (ref 3.5–5.2)
Sodium: 140 mmol/L (ref 134–144)
Total Protein: 7.3 g/dL (ref 6.0–8.5)

## 2020-02-16 LAB — LIPID PANEL
Chol/HDL Ratio: 3 ratio (ref 0.0–5.0)
Cholesterol, Total: 220 mg/dL — ABNORMAL HIGH (ref 100–199)
HDL: 74 mg/dL (ref >39)
LDL Chol Calc (NIH): 134 mg/dL — ABNORMAL HIGH (ref 0–99)
Triglycerides: 66 mg/dL (ref 0–149)
VLDL Cholesterol Cal: 12 mg/dL (ref 5–40)

## 2020-02-16 NOTE — Patient Instructions (Signed)
Fat and Cholesterol Restricted Eating Plan Getting too much fat and cholesterol in your diet may cause health problems. Choosing the right foods helps keep your fat and cholesterol at normal levels. This can keep you from getting certain diseases. Your doctor may recommend an eating plan that includes:  Total fat: ______% or less of total calories a day.  Saturated fat: ______% or less of total calories a day.  Cholesterol: less than _________mg a day.  Fiber: ______g a day. What are tips for following this plan? Meal planning  At meals, divide your plate into four equal parts: ? Fill one-half of your plate with vegetables and green salads. ? Fill one-fourth of your plate with whole grains. ? Fill one-fourth of your plate with low-fat (lean) protein foods.  Eat fish that is high in omega-3 fats at least two times a week. This includes mackerel, tuna, sardines, and salmon.  Eat foods that are high in fiber, such as whole grains, beans, apples, broccoli, carrots, peas, and barley. General tips   Work with your doctor to lose weight if you need to.  Avoid: ? Foods with added sugar. ? Fried foods. ? Foods with partially hydrogenated oils.  Limit alcohol intake to no more than 1 drink a day for nonpregnant women and 2 drinks a day for men. One drink equals 12 oz of beer, 5 oz of wine, or 1 oz of hard liquor. Reading food labels  Check food labels for: ? Trans fats. ? Partially hydrogenated oils. ? Saturated fat (g) in each serving. ? Cholesterol (mg) in each serving. ? Fiber (g) in each serving.  Choose foods with healthy fats, such as: ? Monounsaturated fats. ? Polyunsaturated fats. ? Omega-3 fats.  Choose grain products that have whole grains. Look for the word "whole" as the first word in the ingredient list. Cooking  Cook foods using low-fat methods. These include baking, boiling, grilling, and broiling.  Eat more home-cooked foods. Eat at restaurants and buffets  less often.  Avoid cooking using saturated fats, such as butter, cream, palm oil, palm kernel oil, and coconut oil. Recommended foods  Fruits  All fresh, canned (in natural juice), or frozen fruits. Vegetables  Fresh or frozen vegetables (raw, steamed, roasted, or grilled). Green salads. Grains  Whole grains, such as whole wheat or whole grain breads, crackers, cereals, and pasta. Unsweetened oatmeal, bulgur, barley, quinoa, or brown rice. Corn or whole wheat flour tortillas. Meats and other protein foods  Ground beef (85% or leaner), grass-fed beef, or beef trimmed of fat. Skinless chicken or turkey. Ground chicken or turkey. Pork trimmed of fat. All fish and seafood. Egg whites. Dried beans, peas, or lentils. Unsalted nuts or seeds. Unsalted canned beans. Nut butters without added sugar or oil. Dairy  Low-fat or nonfat dairy products, such as skim or 1% milk, 2% or reduced-fat cheeses, low-fat and fat-free ricotta or cottage cheese, or plain low-fat and nonfat yogurt. Fats and oils  Tub margarine without trans fats. Light or reduced-fat mayonnaise and salad dressings. Avocado. Olive, canola, sesame, or safflower oils. The items listed above may not be a complete list of foods and beverages you can eat. Contact a dietitian for more information. Foods to avoid Fruits  Canned fruit in heavy syrup. Fruit in cream or butter sauce. Fried fruit. Vegetables  Vegetables cooked in cheese, cream, or butter sauce. Fried vegetables. Grains  White bread. White pasta. White rice. Cornbread. Bagels, pastries, and croissants. Crackers and snack foods that contain trans fat   and hydrogenated oils. Meats and other protein foods  Fatty cuts of meat. Ribs, chicken wings, bacon, sausage, bologna, salami, chitterlings, fatback, hot dogs, bratwurst, and packaged lunch meats. Liver and organ meats. Whole eggs and egg yolks. Chicken and turkey with skin. Fried meat. Dairy  Whole or 2% milk, cream,  half-and-half, and cream cheese. Whole milk cheeses. Whole-fat or sweetened yogurt. Full-fat cheeses. Nondairy creamers and whipped toppings. Processed cheese, cheese spreads, and cheese curds. Beverages  Alcohol. Sugar-sweetened drinks such as sodas, lemonade, and fruit drinks. Fats and oils  Butter, stick margarine, lard, shortening, ghee, or bacon fat. Coconut, palm kernel, and palm oils. Sweets and desserts  Corn syrup, sugars, honey, and molasses. Candy. Jam and jelly. Syrup. Sweetened cereals. Cookies, pies, cakes, donuts, muffins, and ice cream. The items listed above may not be a complete list of foods and beverages you should avoid. Contact a dietitian for more information. Summary  Choosing the right foods helps keep your fat and cholesterol at normal levels. This can keep you from getting certain diseases.  At meals, fill one-half of your plate with vegetables and green salads.  Eat high-fiber foods, like whole grains, beans, apples, carrots, peas, and barley.  Limit added sugar, saturated fats, alcohol, and fried foods. This information is not intended to replace advice given to you by your health care provider. Make sure you discuss any questions you have with your health care provider. Document Revised: 11/27/2017 Document Reviewed: 12/11/2016 Elsevier Patient Education  2020 Elsevier Inc.  

## 2020-02-16 NOTE — Assessment & Plan Note (Signed)
Slightly elevated cholesterol from previous labs, repeat labs completed provided education to patient with printed handouts given.  Labs pending.

## 2020-02-16 NOTE — Progress Notes (Signed)
Established Patient Office Visit  Subjective:  Patient ID: KETRICK Reed, male    DOB: 1945-08-14  Age: 74 y.o. MRN: [id number]  CC:  Chief Complaint  Patient presents with  . Discuss labwork from [place]    HPI Kenneth Reed is a 74 year old male who presents to clinic for abnormal lab results from the [place].  Patient's TSH is elevated and slightly elevated cholesterol, Patient is not currently on any medication for high pole thyroidism or hypercholesterol.  Patient is not reporting any signs or symptoms of hypercholesterolemia or hypothyroidism.  Reviewed lab results with patient, patient verbalized understanding.    Past Medical History:  Diagnosis Date  . Bladder cancer [facility]) 2010   Dr. Maryland Pink operated; no need for chemo; recurred locally  in 2013 locally.    Marland Kitchen BPH (benign prostatic hyperplasia)   . Cancer ([place]) 03/26/12   High Grade NonHodgkin's B- Cell Lymphoma  . Colon cancer [facility]) 2010   Dr. Anthony Sar operated; stage II.  No need for chemo.  Marland Kitchen COPD (chronic obstructive pulmonary disease) ([place])   . Fatigue   . History of colon cancer 01/07/2013  . Kidney stone   . Non-Hodgkin's lymphoma of head ([place]) 2014  . Status post chemotherapy 04/18/2012 - 06/20/12    4 Cycles of R-CHOP with 3 Doses of Prophylactic Intrathecal chemo.  . Weight loss 07/11/12 note    Past Surgical History:  Procedure Laterality Date  . Biospy Left Tonsil  03/26/12   Non - Hodgkins B-Cell Lympyhoma  . BLADDER SURGERY  2010  . COLECTOMY  2010  . COLONOSCOPY  2012   next one 2014.   . CYSTOSCOPY W/ RETROGRADES Bilateral 09/09/2019   Procedure: CYSTOSCOPY WITH RETROGRADE PYELOGRAM;  Surgeon: Cleon Gustin, MD;  Location: [facility] ORS;  Service: Urology;  Laterality: Bilateral;  . HAND TENDON SURGERY Bilateral   . NASAL POLYP SURGERY  1983  . TRANSURETHRAL RESECTION OF BLADDER TUMOR N/A 09/09/2019   Procedure: TRANSURETHRAL RESECTION OF BLADDER TUMOR (TURBT);  Surgeon: Cleon Gustin, MD;  Location: [facility]  ORS;  Service: Urology;  Laterality: N/A;    Family History  Problem Relation Age of Onset  . Cancer Father        leukemia  . Cancer Maternal Aunt        leukemia  . Cancer Paternal Grandmother        leukemia  . Narcolepsy Daughter     Social History   Socioeconomic History  . Marital status: Single    Spouse name: Not on file  . Number of children: 2  . Years of education: GED  . Highest education level: GED or equivalent  Occupational History  . Occupation: Retired    Fish farm manager: [employer]    Comment: textile   Tobacco Use  . Smoking status: Never Smoker  . Smokeless tobacco: Former Systems developer    Types: Secondary school teacher  . Vaping Use: Never used  Substance and Sexual Activity  . Alcohol use: Yes    Alcohol/week: 2.0 standard drinks    Types: 2 Cans of beer per week    Comment: few beers here and there, nothing regular  . Drug use: No  . Sexual activity: Not Currently  Other Topics Concern  . Not on file  Social History Narrative  . Not on file   Social Determinants of Health   Financial Resource Strain:   . Difficulty of Paying Living Expenses: Not on file  Food Insecurity:   .  Worried About Running Out of Food in the Last Year: Not on file  . Ran Out of Food in the Last Year: Not on file  Transportation Needs:   . Lack of Transportation (Medical): Not on file  . Lack of Transportation (Non-Medical): Not on file  Physical Activity:   . Days of Exercise per Week: Not on file  . Minutes of Exercise per Session: Not on file  Stress:   . Feeling of Stress : Not on file  Social Connections:   . Frequency of Communication with Friends and Family: Not on file  . Frequency of Social Gatherings with Friends and Family: Not on file  . Attends Religious Services: Not on file  . Active Member of Clubs or Organizations: Not on file  . Attends Club or Organization Meetings: Not on file  . Marital Status: Not on file  Intimate Partner Violence:   . Fear of Current or  Ex-Partner: Not on file  . Emotionally Abused: Not on file  . Physically Abused: Not on file  . Sexually Abused: Not on file    Outpatient Medications Prior to Visit  Medication Sig Dispense Refill  . acetaminophen (TYLENOL) 500 MG tablet Take 1,000 mg by mouth every 6 (six) hours as needed for moderate pain.     . albuterol (PROVENTIL HFA;VENTOLIN HFA) 108 (90 Base) MCG/ACT inhaler Inhale 2 puffs into the lungs every 4 (four) hours as needed. 1 Inhaler 2  . budesonide-formoterol (SYMBICORT) 160-4.5 MCG/ACT inhaler Inhale 2 puffs into the lungs 2 (two) times daily.    . finasteride (PROSCAR) 5 MG tablet Take 5 mg by mouth daily.    . fluticasone (FLONASE) 50 MCG/ACT nasal spray Place 1 spray into both nostrils 2 (two) times daily as needed for allergies or rhinitis. 16 g 6  . tamsulosin (FLOMAX) 0.4 MG CAPS capsule Take 1 capsule (0.4 mg total) by mouth in the morning and at bedtime. 60 capsule 11  . oxyCODONE-acetaminophen (PERCOCET) 5-325 MG tablet Take 1 tablet by mouth every 4 (four) hours as needed for moderate pain or severe pain. (Patient not taking: Reported on 02/16/2020) 15 tablet 0  . sulfamethoxazole-trimethoprim (BACTRIM DS) 800-160 MG tablet Take 1 tablet by mouth every 12 (twelve) hours. 14 tablet 0   No facility-administered medications prior to visit.    No Known Allergies  ROS Review of Systems  Constitutional: Positive for fatigue. Negative for activity change, appetite change, chills and fever.  All other systems reviewed and are negative.     Objective:    Physical Exam Vitals reviewed.  Constitutional:      Appearance: Normal appearance.  HENT:     Head: Normocephalic.     Nose: Nose normal.  Eyes:     Conjunctiva/sclera: Conjunctivae normal.  Cardiovascular:     Rate and Rhythm: Normal rate and regular rhythm.     Pulses: Normal pulses.     Heart sounds: Normal heart sounds.  Pulmonary:     Effort: Pulmonary effort is normal.  Abdominal:      General: Bowel sounds are normal.  Musculoskeletal:     Cervical back: No tenderness.  Skin:    General: Skin is warm.  Neurological:     Mental Status: He is alert and oriented to person, place, and time.     BP (!) 152/90   Pulse 84   Temp (!) 97.2 F (36.2 C) (Temporal)   Resp 20   Ht 5' 11" (1.803 m)   Wt 136   lb 8 oz (61.9 kg)   SpO2 98%   BMI 19.04 kg/m  Wt Readings from Last 3 Encounters:  02/16/20 136 lb 8 oz (61.9 kg)  12/21/19 135 lb (61.2 kg)  09/23/19 135 lb (61.2 kg)     Health Maintenance Due  Topic Date Due  . COVID-19 Vaccine (1) Never done  . TETANUS/TDAP  Never done    There are no preventive care reminders to display for this patient.  Lab Results  Component Value Date   TSH 2.401 08/05/2017   Lab Results  Component Value Date   WBC 4.1 09/03/2019   HGB 14.1 09/03/2019   HCT 42.5 09/03/2019   MCV 94.9 09/03/2019   PLT 205 09/03/2019   Lab Results  Component Value Date   NA 137 09/03/2019   K 3.8 09/03/2019   CHLORIDE 105 07/30/2016   CO2 19 (L) 09/03/2019   GLUCOSE 79 09/03/2019   BUN 14 09/03/2019   CREATININE 0.82 09/03/2019   BILITOT 0.9 08/05/2017   ALKPHOS 59 08/05/2017   AST 19 08/05/2017   ALT 13 08/05/2017   PROT 7.2 08/05/2017   ALBUMIN 4.3 08/05/2017   CALCIUM 8.7 (L) 09/03/2019   ANIONGAP 11 09/03/2019   EGFR 72 (L) 07/30/2016     Assessment & Plan:   Problem List Items Addressed This Visit      Other   Abnormal laboratory test result   Relevant Orders   TSH   Elevated TSH - Primary    Patient is a 74-year-old male who [facility]  Elevated TSH Repeat TSH with free T4 T3.  Provided education to patient with printed handouts given. Labs completed results pending.        Relevant Orders   TSH   Elevated cholesterol    Slightly elevated cholesterol from previous labs, repeat labs completed provided education to patient with printed handouts given.  Labs  pending.         Relevant Orders   CBC with Differential   Comprehensive metabolic panel   Lipid Panel   TSH   Abnormal thyroid screen (blood)   Relevant Orders   TSH      No orders of the defined types were placed in this encounter.   Follow-up: Return in about 3 months (around 05/18/2020).     M , NP 

## 2020-02-16 NOTE — Assessment & Plan Note (Signed)
Patient is a 74 year old male who presents to clinic with abnormal labs from the [place].  Elevated TSH Repeat TSH with free T4 T3.  Provided education to patient with printed handouts given. Labs completed results pending.

## 2020-02-17 LAB — TSH: TSH: 3.12 u[IU]/mL (ref 0.450–4.500)

## 2020-03-16 NOTE — H&P (Signed)
Surgical History & Physical  Patient Name: Kenneth Reed DOB: [birth year]  Surgery: Cataract extraction with intraocular lens implant phacoemulsification; Right Eye  Surgeon: Kenneth Goldmann MD Surgery Date:  03/30/2020 Pre-Op Date:  03/16/2020  HPI: A 14 Yr. old male patient 1. 1. The patient complains of difficulty when driving at night, which began 1 year ago. Both eyes are affected. This is negatively affecting the patient's quality of life. The episode is gradual. The patient describes foggy, glare and hazy symptoms affecting their eyes/vision. The condition's severity is worsening. Distance vision is worse than near. The complaint is associated with blurry vision and halos. The patient experiences no flashes, floater, shadow, curtain or veil HPI was performed by Kenneth Reed .  Medical History: Cataracts COPD History of non-Hodgkin's lymphoma Bladder issues  Review of Systems Negative Allergic/Immunologic Negative Cardiovascular Negative Constitutional Negative Ear, Nose, Mouth & Throat Negative Endocrine Negative Eyes Negative Gastrointestinal Negative Genitourinary Negative Hemotologic/Lymphatic Negative Integumentary Negative Musculoskeletal Negative Neurological Negative Psychiatry Negative Respiratory  Social   Never Smoked  Medication Tamsulosin, Finasteride, Symbicort Inhalant Product,   Sx/Procedures Bladder,   Drug Allergies   NKDA  History & Physical: Heent:  Cataract, Right eye NECK: supple without bruits LUNGS: lungs clear to auscultation CV: regular rate and rhythm Abdomen: soft and non-tender  Impression & Plan: Assessment: 1.  COMBINED FORMS AGE RELATED CATARACT; Both Eyes (H25.813)  Plan: 1.  Cataract accounts for the patient's decreased vision. This visual impairment is not correctable with a tolerable change in glasses or contact lenses. Cataract surgery with an implantation of a new lens should significantly improve the visual and  functional status of the patient. Discussed all risks, benefits, alternatives, and potential complications. Discussed the procedures and recovery. Patient desires to have surgery. A-scan ordered and performed today for intra-ocular lens calculations. The surgery will be performed in order to improve vision for driving, reading, and for eye examinations. Recommend phacoemulsification with intra-ocular lens. Recommend Dextenza for post-operative pain and inflammation. Right Eye. worse - first. Dilates well - shugarcaine by protocol. History of Flomax use - [name] in bag. Kenneth Reed

## 2020-03-24 ENCOUNTER — Other Ambulatory Visit: Payer: Self-pay

## 2020-03-24 ENCOUNTER — Encounter (HOSPITAL_COMMUNITY)
Admission: RE | Admit: 2020-03-24 | Discharge: 2020-03-24 | Disposition: A | Discharge status: Home / Self Care | Payer: Medicare Other | Source: Ambulatory Visit | Attending: Ophthalmology | Admitting: Ophthalmology

## 2020-03-24 ENCOUNTER — Encounter (HOSPITAL_COMMUNITY): Payer: Self-pay

## 2020-03-24 HISTORY — DX: Unspecified hearing loss, unspecified ear: H91.90

## 2020-03-28 ENCOUNTER — Other Ambulatory Visit: Payer: Self-pay

## 2020-03-28 ENCOUNTER — Other Ambulatory Visit (HOSPITAL_COMMUNITY)
Admission: RE | Admit: 2020-03-28 | Discharge: 2020-03-28 | Disposition: A | Discharge status: Home / Self Care | Payer: Medicare Other | Source: Ambulatory Visit | Attending: Ophthalmology | Admitting: Ophthalmology

## 2020-03-28 DIAGNOSIS — Z20822 Contact with and (suspected) exposure to covid-19: Secondary | ICD-10-CM | POA: Insufficient documentation

## 2020-03-28 DIAGNOSIS — Z01812 Encounter for preprocedural laboratory examination: Secondary | ICD-10-CM | POA: Insufficient documentation

## 2020-03-28 LAB — SARS CORONAVIRUS 2 (TAT 6-24 HRS): SARS Coronavirus 2: NEGATIVE

## 2020-03-29 MED ORDER — TROPICAMIDE 1 % OP SOLN
OPHTHALMIC | Status: AC
  Filled 2020-03-29: qty 3

## 2020-03-29 MED ORDER — PHENYLEPHRINE HCL 2.5 % OP SOLN
OPHTHALMIC | Status: AC
  Filled 2020-03-29: qty 15

## 2020-03-29 MED ORDER — NEOMYCIN-POLYMYXIN-DEXAMETH 3.5-10000-0.1 OP SUSP
OPHTHALMIC | Status: AC
  Filled 2020-03-29: qty 5

## 2020-03-29 MED ORDER — TETRACAINE HCL 0.5 % OP SOLN
OPHTHALMIC | Status: AC
  Filled 2020-03-29: qty 4

## 2020-03-29 MED ORDER — LIDOCAINE HCL 3.5 % OP GEL
OPHTHALMIC | Status: AC
  Filled 2020-03-29: qty 1

## 2020-03-29 MED ORDER — LIDOCAINE HCL (PF) 1 % IJ SOLN
INTRAMUSCULAR | Status: AC
  Filled 2020-03-29: qty 2

## 2020-03-30 ENCOUNTER — Ambulatory Visit (HOSPITAL_COMMUNITY)
Admission: RE | Admit: 2020-03-30 | Discharge: 2020-03-30 | Disposition: A | Discharge status: Home / Self Care | Payer: Medicare Other | Attending: Ophthalmology | Admitting: Ophthalmology

## 2020-03-30 ENCOUNTER — Encounter (HOSPITAL_COMMUNITY): Payer: Self-pay | Admitting: Ophthalmology

## 2020-03-30 ENCOUNTER — Ambulatory Visit (HOSPITAL_COMMUNITY): Payer: Medicare Other | Admitting: Anesthesiology

## 2020-03-30 ENCOUNTER — Encounter (HOSPITAL_COMMUNITY)
Admission: RE | Disposition: A | Discharge status: Home / Self Care | Payer: Self-pay | Source: Home / Self Care | Attending: Ophthalmology

## 2020-03-30 DIAGNOSIS — Z8572 Personal history of non-Hodgkin lymphomas: Secondary | ICD-10-CM | POA: Insufficient documentation

## 2020-03-30 DIAGNOSIS — Z7951 Long term (current) use of inhaled steroids: Secondary | ICD-10-CM | POA: Insufficient documentation

## 2020-03-30 DIAGNOSIS — H25813 Combined forms of age-related cataract, bilateral: Secondary | ICD-10-CM | POA: Insufficient documentation

## 2020-03-30 DIAGNOSIS — Z79899 Other long term (current) drug therapy: Secondary | ICD-10-CM | POA: Insufficient documentation

## 2020-03-30 HISTORY — PX: CATARACT EXTRACTION W/PHACO: SHX586

## 2020-03-30 SURGERY — PHACOEMULSIFICATION, CATARACT, WITH IOL INSERTION
Anesthesia: Monitor Anesthesia Care | Site: Eye | Laterality: Right

## 2020-03-30 MED ORDER — PHENYLEPHRINE-KETOROLAC 1-0.3 % IO SOLN
INTRAOCULAR | Status: DC | PRN
  Administered 2020-03-30: 500 mL via OPHTHALMIC

## 2020-03-30 MED ORDER — LIDOCAINE HCL (PF) 1 % IJ SOLN
INTRAOCULAR | Status: DC | PRN
  Administered 2020-03-30: 1 mL via OPHTHALMIC

## 2020-03-30 MED ORDER — SODIUM HYALURONATE 23 MG/ML IO SOLN
INTRAOCULAR | Status: DC | PRN
  Administered 2020-03-30: 0.6 mL via INTRAOCULAR

## 2020-03-30 MED ORDER — STERILE WATER FOR IRRIGATION IR SOLN
Status: DC | PRN
  Administered 2020-03-30: 250 mL

## 2020-03-30 MED ORDER — POVIDONE-IODINE 5 % OP SOLN
OPHTHALMIC | Status: DC | PRN
  Administered 2020-03-30: 1 via OPHTHALMIC

## 2020-03-30 MED ORDER — TETRACAINE HCL 0.5 % OP SOLN
1.0000 [drp] | OPHTHALMIC | Status: AC | PRN
  Administered 2020-03-30 (×3): 1 [drp] via OPHTHALMIC

## 2020-03-30 MED ORDER — EPINEPHRINE PF 1 MG/ML IJ SOLN
INTRAMUSCULAR | Status: AC
  Filled 2020-03-30: qty 2

## 2020-03-30 MED ORDER — NEOMYCIN-POLYMYXIN-DEXAMETH 3.5-10000-0.1 OP SUSP
OPHTHALMIC | Status: DC | PRN
  Administered 2020-03-30: 1 [drp] via OPHTHALMIC

## 2020-03-30 MED ORDER — LIDOCAINE HCL 3.5 % OP GEL
1.0000 "application " | Freq: Once | OPHTHALMIC | Status: AC
  Administered 2020-03-30: 1 via OPHTHALMIC

## 2020-03-30 MED ORDER — PHENYLEPHRINE-KETOROLAC 1-0.3 % IO SOLN
INTRAOCULAR | Status: AC
  Filled 2020-03-30: qty 4

## 2020-03-30 MED ORDER — TROPICAMIDE 1 % OP SOLN
1.0000 [drp] | OPHTHALMIC | Status: AC
  Administered 2020-03-30 (×3): 1 [drp] via OPHTHALMIC

## 2020-03-30 MED ORDER — BSS IO SOLN
INTRAOCULAR | Status: DC | PRN
  Administered 2020-03-30: 15 mL via INTRAOCULAR

## 2020-03-30 MED ORDER — PROVISC 10 MG/ML IO SOLN
INTRAOCULAR | Status: DC | PRN
  Administered 2020-03-30: 0.85 mL via INTRAOCULAR

## 2020-03-30 MED ORDER — PHENYLEPHRINE HCL 2.5 % OP SOLN
1.0000 [drp] | OPHTHALMIC | Status: AC | PRN
  Administered 2020-03-30 (×3): 1 [drp] via OPHTHALMIC

## 2020-03-30 SURGICAL SUPPLY — 12 items
CLOTH BEACON ORANGE TIMEOUT ST (SAFETY) ×2 IMPLANT
EYE SHIELD UNIVERSAL CLEAR (GAUZE/BANDAGES/DRESSINGS) ×2 IMPLANT
GLOVE BIOGEL PI IND STRL 7.0 (GLOVE) ×2 IMPLANT
GLOVE BIOGEL PI INDICATOR 7.0 (GLOVE) ×2
NEEDLE HYPO 18GX1.5 BLUNT FILL (NEEDLE) ×2 IMPLANT
PAD ARMBOARD 7.5X6 YLW CONV (MISCELLANEOUS) ×2 IMPLANT
SYR TB 1ML LL NO SAFETY (SYRINGE) ×2 IMPLANT
TAPE SURG TRANSPORE 1 IN (GAUZE/BANDAGES/DRESSINGS) ×1 IMPLANT
TAPE SURGICAL TRANSPORE 1 IN (GAUZE/BANDAGES/DRESSINGS) ×1
TECNIS EYHANCE IOL (Intraocular Lens) ×2 IMPLANT
VISCOELASTIC ADDITIONAL (OPHTHALMIC RELATED) IMPLANT
WATER STERILE IRR 250ML POUR (IV SOLUTION) ×2 IMPLANT

## 2020-03-30 NOTE — Transfer of Care (Signed)
Immediate Anesthesia Transfer of Care Note  Patient: Kenneth Reed  Procedure(s) Performed: CATARACT EXTRACTION PHACO AND INTRAOCULAR LENS PLACEMENT (IOC) (Right Eye)  Patient Location: PACU  Anesthesia Type:MAC  Level of Consciousness: awake, alert  and oriented  Airway & Oxygen Therapy: Patient Spontanous Breathing  Post-op Assessment: Report given to RN and Post -op Vital signs reviewed and stable  Post vital signs: Reviewed and stable  Last Vitals:  Vitals Value Taken Time  BP    Temp    Pulse    Resp    SpO2      Last Pain:  Vitals:   03/30/20 0720  TempSrc: Oral  PainSc: 0-No pain      Patients Stated Pain Goal: 5 (27/78/24 2353)  Complications: No complications documented.

## 2020-03-30 NOTE — Anesthesia Postprocedure Evaluation (Signed)
Anesthesia Post Note  Patient: Kenneth Reed  Procedure(s) Performed: CATARACT EXTRACTION PHACO AND INTRAOCULAR LENS PLACEMENT (IOC) (Right Eye)  Patient location during evaluation: Phase II Anesthesia Type: MAC Level of consciousness: awake and oriented Pain management: pain level controlled Vital Signs Assessment: post-procedure vital signs reviewed and stable Respiratory status: spontaneous breathing Cardiovascular status: stable Postop Assessment: no apparent nausea or vomiting Anesthetic complications: no   No complications documented.   Last Vitals:  Vitals:   03/30/20 0720  BP: 129/76  Pulse: 81  Resp: (!) 24  Temp: 36.6 C  SpO2: 99%    Last Pain:  Vitals:   03/30/20 0720  TempSrc: Oral  PainSc: 0-No pain                 Karna Dupes

## 2020-03-30 NOTE — Interval H&P Note (Signed)
History and Physical Interval Note:  03/30/2020 8:20 AM  Kenneth Reed  has presented today for surgery, with the diagnosis of Nuclear sclerotic cataract - Right eye.  The various methods of treatment have been discussed with the patient and family. After consideration of risks, benefits and other options for treatment, the patient has consented to  Procedure(s) with comments: CATARACT EXTRACTION PHACO AND INTRAOCULAR LENS PLACEMENT (IOC) (Right) - right as a surgical intervention.  The patient's history has been reviewed, patient examined, no change in status, stable for surgery.  I have reviewed the patient's chart and labs.  Questions were answered to the patient's satisfaction.     Baruch Goldmann

## 2020-03-30 NOTE — Anesthesia Preprocedure Evaluation (Signed)
Anesthesia Evaluation  Patient identified by MRN, date of birth, ID band Patient awake    Reviewed: Allergy & Precautions, NPO status , Patient's Chart, lab work & pertinent test results  History of Anesthesia Complications Negative for: history of anesthetic complications  Airway Mallampati: II  TM Distance: >3 FB Neck ROM: Full    Dental  (+) Edentulous Upper, Edentulous Lower, Implants   Pulmonary COPD,  COPD inhaler,    Pulmonary exam normal breath sounds clear to auscultation       Cardiovascular negative cardio ROS Normal cardiovascular exam Rhythm:Regular Rate:Normal     Neuro/Psych negative neurological ROS  negative psych ROS   GI/Hepatic negative GI ROS, Neg liver ROS,   Endo/Other  negative endocrine ROS  Renal/GU Renal disease (stones)  negative genitourinary   Musculoskeletal negative musculoskeletal ROS (+)   Abdominal   Peds negative pediatric ROS (+)  Hematology negative hematology ROS (+)   Anesthesia Other Findings   Reproductive/Obstetrics negative OB ROS                             Anesthesia Physical Anesthesia Plan  ASA: II  Anesthesia Plan: MAC   Post-op Pain Management:    Induction:   PONV Risk Score and Plan:   Airway Management Planned: Nasal Cannula and Natural Airway  Additional Equipment:   Intra-op Plan:   Post-operative Plan:   Informed Consent: I have reviewed the patients History and Physical, chart, labs and discussed the procedure including the risks, benefits and alternatives for the proposed anesthesia with the patient or authorized representative who has indicated his/her understanding and acceptance.       Plan Discussed with: CRNA and Surgeon  Anesthesia Plan Comments:         Anesthesia Quick Evaluation

## 2020-03-30 NOTE — Discharge Instructions (Signed)
Please discharge patient when stable, will follow up today with Dr. Jenessa Gillingham at the [facility] office immediately following discharge.  Leave shield in place until visit.  All paperwork with discharge instructions will be given at the office.  [facility] Address:  [address]  [place], [place] [address]  

## 2020-03-30 NOTE — Op Note (Addendum)
Date of procedure: 03/30/20  Pre-operative diagnosis:  Visually significant combined form age-related cataract, Right Eye (H25.811)  Post-operative diagnosis:  Visually significant combined form age-related cataract, Right Eye (H25.811)  Procedure: Removal of cataract via phacoemulsification and insertion of intra-ocular lens Wynetta Emery and [employer] DIB00  +22.5D into the capsular bag of the Right Eye  Attending surgeon: Gerda Diss. Kire Ferg, MD, MA  Anesthesia: MAC, Topical Akten  Complications: None  Estimated Blood Loss: <43m (minimal)  Specimens: None  Implants: As above  Indications:  Visually significant age-related cataract, Right Eye  Procedure:  The patient was seen and identified in the pre-operative area. The operative eye was identified and dilated.  The operative eye was marked.  Topical anesthesia was administered to the operative eye.     The patient was then to the operative suite and placed in the supine position.  A timeout was performed confirming the patient, procedure to be performed, and all other relevant information.   The patient's face was prepped and draped in the usual fashion for intra-ocular surgery.  A lid speculum was placed into the operative eye and the surgical microscope moved into place and focused.  A superotemporal paracentesis was created using a 20 gauge paracentesis blade.  Shugarcaine was injected into the anterior chamber.  Viscoelastic was injected into the anterior chamber.  A temporal clear-corneal main wound incision was created using a 2.464mmicrokeratome.  A continuous curvilinear capsulorrhexis was initiated using an irrigating cystitome and completed using capsulorrhexis forceps.  Hydrodissection and hydrodeliniation were performed.  Viscoelastic was injected into the anterior chamber.  A phacoemulsification handpiece and a chopper as a second instrument were used to remove the nucleus and epinucleus. The irrigation/aspiration handpiece was  used to remove any remaining cortical material.   The capsular bag was reinflated with viscoelastic, checked, and found to be intact.  The intraocular lens was inserted into the capsular bag.  The irrigation/aspiration handpiece was used to remove any remaining viscoelastic.  The clear corneal wound and paracentesis wounds were then hydrated and checked with Weck-Cels to be watertight.  The lid-speculum was removed.  The drape was removed.  The patient's face was cleaned with a wet and dry 4x4.   Maxitrol was instilled in the eye. A clear shield was taped over the eye. The patient was taken to the post-operative care unit in good condition, having tolerated the procedure well.  Post-Op Instructions: [facility] and will receive all other orders and instructions.

## 2020-03-31 ENCOUNTER — Encounter (HOSPITAL_COMMUNITY): Payer: Self-pay | Admitting: Ophthalmology

## 2020-04-07 NOTE — H&P (Signed)
Surgical History & Physical  Patient Name: Kenneth Reed DOB: 11-25-1945  Surgery: Cataract extraction with intraocular lens implant phacoemulsification; Left Eye  Surgeon: Fabio Pierce MD Surgery Date:  04/15/2020 Pre-Op Date:  04/06/2020  HPI: [name] Pt is present for PO-OD/pre op OS The patient is returning after cataract surgery. The right eye is affected. Status post cataract surgery, which began 1 weeks ago: Since the last visit, the affected area feels improvement and is doing well. The patient's vision is improved and stable. Patient is following medication instructions. Omni TID OD. The patient experiences no increase in floaters. The patient complains of difficulty when driving at night, which began 1 year ago. The left eye is affected. This is negatively affecting the patient's quality of life. The episode is gradual. The condition's severity is worsening. Distance vision is worse than near. The complaint is associated with blurry vision and halos. HPI Completed by Dr. Fabio Pierce  Medical History: Cataracts  Review of Systems Negative Allergic/Immunologic Negative Cardiovascular Negative Constitutional Negative Ear, Nose, Mouth & Throat Negative Endocrine Negative Eyes Negative Gastrointestinal Negative Genitourinary Negative Hemotologic/Lymphatic Negative Integumentary Negative Musculoskeletal Negative Neurological Negative Psychiatry Negative Respiratory  Social   Never Smoked  Medication Prednisolone-gatiflox-bromfenac,  Tamsulosin, Finasteride, Symbicort Inhalant Product,   Sx/Procedures Phaco c IOL OD,  Bladder,   Drug Allergies   NKDA  History & Physical: Heent:  Cataract, Left eye NECK: supple without bruits LUNGS: lungs clear to auscultation CV: regular rate and rhythm Abdomen: soft and non-tender  Impression & Plan: Assessment: 1.  CATARACT EXTRACTION STATUS; Right Eye (Z98.41) 2.  COMBINED FORMS AGE RELATED CATARACT;  Left Eye (H25.812)  Plan: 1.  1 week after cataract surgery. Doing well with improved vision and normal eye pressure. Call with any problems or concerns. Continue Gati-Brom-Pred 2x/day for 3 more weeks. 2.  Dilates well - shugarcaine by protocol. History of Flomax use - [name] in bag. DIB00 - Veteran Cataract accounts for the patient's decreased vision. This visual impairment is not correctable with a tolerable change in glasses or contact lenses. Cataract surgery with an implantation of a new lens should significantly improve the visual and functional status of the patient. Discussed all risks, benefits, alternatives, and potential complications. Discussed the procedures and recovery. Patient desires to have surgery. A-scan ordered and performed today for intra-ocular lens calculations. The surgery will be performed in order to improve vision for driving, reading, and for eye examinations. Recommend phacoemulsification with intra-ocular lens. Recommend Dextenza for post-operative pain and inflammation. Left Eye. Surgery required to correct imbalance of vision.

## 2020-04-12 ENCOUNTER — Encounter (HOSPITAL_COMMUNITY): Payer: Self-pay

## 2020-04-12 ENCOUNTER — Encounter (HOSPITAL_COMMUNITY)
Admission: RE | Admit: 2020-04-12 | Discharge: 2020-04-12 | Disposition: A | Discharge status: Home / Self Care | Payer: Medicare Other | Source: Ambulatory Visit | Attending: Ophthalmology | Admitting: Ophthalmology

## 2020-04-12 ENCOUNTER — Other Ambulatory Visit: Payer: Self-pay

## 2020-04-13 ENCOUNTER — Other Ambulatory Visit (HOSPITAL_COMMUNITY)
Admission: RE | Admit: 2020-04-13 | Discharge: 2020-04-13 | Disposition: A | Discharge status: Home / Self Care | Payer: Medicare Other | Source: Ambulatory Visit | Attending: Ophthalmology | Admitting: Ophthalmology

## 2020-04-13 ENCOUNTER — Other Ambulatory Visit: Payer: Self-pay

## 2020-04-13 DIAGNOSIS — Z20822 Contact with and (suspected) exposure to covid-19: Secondary | ICD-10-CM | POA: Diagnosis not present

## 2020-04-13 DIAGNOSIS — Z01812 Encounter for preprocedural laboratory examination: Secondary | ICD-10-CM | POA: Insufficient documentation

## 2020-04-14 LAB — SARS CORONAVIRUS 2 (TAT 6-24 HRS): SARS Coronavirus 2: NEGATIVE

## 2020-04-15 ENCOUNTER — Other Ambulatory Visit: Payer: Self-pay

## 2020-04-15 ENCOUNTER — Encounter (HOSPITAL_COMMUNITY)
Admission: RE | Disposition: A | Discharge status: Home / Self Care | Payer: Self-pay | Source: Home / Self Care | Attending: Ophthalmology

## 2020-04-15 ENCOUNTER — Encounter (HOSPITAL_COMMUNITY): Payer: Self-pay | Admitting: Ophthalmology

## 2020-04-15 ENCOUNTER — Ambulatory Visit (HOSPITAL_COMMUNITY): Payer: Medicare Other | Admitting: Certified Registered"

## 2020-04-15 ENCOUNTER — Ambulatory Visit (HOSPITAL_COMMUNITY)
Admission: RE | Admit: 2020-04-15 | Discharge: 2020-04-15 | Disposition: A | Discharge status: Home / Self Care | Payer: Medicare Other | Attending: Ophthalmology | Admitting: Ophthalmology

## 2020-04-15 DIAGNOSIS — Z9841 Cataract extraction status, right eye: Secondary | ICD-10-CM | POA: Insufficient documentation

## 2020-04-15 DIAGNOSIS — H25812 Combined forms of age-related cataract, left eye: Secondary | ICD-10-CM | POA: Insufficient documentation

## 2020-04-15 DIAGNOSIS — J449 Chronic obstructive pulmonary disease, unspecified: Secondary | ICD-10-CM | POA: Diagnosis not present

## 2020-04-15 DIAGNOSIS — Z7951 Long term (current) use of inhaled steroids: Secondary | ICD-10-CM | POA: Insufficient documentation

## 2020-04-15 DIAGNOSIS — E7800 Pure hypercholesterolemia, unspecified: Secondary | ICD-10-CM | POA: Diagnosis not present

## 2020-04-15 DIAGNOSIS — Z79899 Other long term (current) drug therapy: Secondary | ICD-10-CM | POA: Insufficient documentation

## 2020-04-15 DIAGNOSIS — H25811 Combined forms of age-related cataract, right eye: Secondary | ICD-10-CM | POA: Diagnosis not present

## 2020-04-15 DIAGNOSIS — H2181 Floppy iris syndrome: Secondary | ICD-10-CM | POA: Diagnosis not present

## 2020-04-15 HISTORY — PX: CATARACT EXTRACTION W/PHACO: SHX586

## 2020-04-15 SURGERY — PHACOEMULSIFICATION, CATARACT, WITH IOL INSERTION
Anesthesia: Monitor Anesthesia Care | Site: Eye | Laterality: Left

## 2020-04-15 MED ORDER — PHENYLEPHRINE HCL 2.5 % OP SOLN
1.0000 [drp] | OPHTHALMIC | Status: AC | PRN
  Administered 2020-04-15 (×3): 1 [drp] via OPHTHALMIC

## 2020-04-15 MED ORDER — LIDOCAINE HCL 3.5 % OP GEL
1.0000 "application " | Freq: Once | OPHTHALMIC | Status: AC
  Administered 2020-04-15: 1 via OPHTHALMIC

## 2020-04-15 MED ORDER — LIDOCAINE HCL (PF) 1 % IJ SOLN
INTRAOCULAR | Status: DC | PRN
  Administered 2020-04-15: 1 mL via OPHTHALMIC

## 2020-04-15 MED ORDER — SODIUM HYALURONATE 23 MG/ML IO SOLN
INTRAOCULAR | Status: DC | PRN
  Administered 2020-04-15: 0.6 mL via INTRAOCULAR

## 2020-04-15 MED ORDER — TETRACAINE HCL 0.5 % OP SOLN
1.0000 [drp] | OPHTHALMIC | Status: AC | PRN
  Administered 2020-04-15 (×3): 1 [drp] via OPHTHALMIC

## 2020-04-15 MED ORDER — TROPICAMIDE 1 % OP SOLN
1.0000 [drp] | OPHTHALMIC | Status: AC
  Administered 2020-04-15 (×3): 1 [drp] via OPHTHALMIC

## 2020-04-15 MED ORDER — PHENYLEPHRINE-KETOROLAC 1-0.3 % IO SOLN
INTRAOCULAR | Status: AC
  Filled 2020-04-15: qty 4

## 2020-04-15 MED ORDER — NEOMYCIN-POLYMYXIN-DEXAMETH 3.5-10000-0.1 OP SUSP
OPHTHALMIC | Status: DC | PRN
  Administered 2020-04-15: 1 [drp] via OPHTHALMIC

## 2020-04-15 MED ORDER — PROVISC 10 MG/ML IO SOLN
INTRAOCULAR | Status: DC | PRN
  Administered 2020-04-15: 0.85 mL via INTRAOCULAR

## 2020-04-15 MED ORDER — BSS IO SOLN
INTRAOCULAR | Status: DC | PRN
  Administered 2020-04-15: 15 mL via INTRAOCULAR

## 2020-04-15 MED ORDER — STERILE WATER FOR IRRIGATION IR SOLN
Status: DC | PRN
  Administered 2020-04-15: 250 mL

## 2020-04-15 MED ORDER — POVIDONE-IODINE 5 % OP SOLN
OPHTHALMIC | Status: DC | PRN
  Administered 2020-04-15: 1 via OPHTHALMIC

## 2020-04-15 MED ORDER — EPINEPHRINE PF 1 MG/ML IJ SOLN
INTRAMUSCULAR | Status: AC
  Filled 2020-04-15: qty 2

## 2020-04-15 SURGICAL SUPPLY — 16 items
CLOTH BEACON ORANGE TIMEOUT ST (SAFETY) ×2 IMPLANT
DEVICE MILOOP (MISCELLANEOUS) IMPLANT
EYE SHIELD UNIVERSAL CLEAR (GAUZE/BANDAGES/DRESSINGS) ×2 IMPLANT
GLOVE BIOGEL PI IND STRL 7.0 (GLOVE) ×2 IMPLANT
GLOVE BIOGEL PI INDICATOR 7.0 (GLOVE) ×2
MILOOP DEVICE (MISCELLANEOUS)
NEEDLE HYPO 18GX1.5 BLUNT FILL (NEEDLE) ×2 IMPLANT
PAD ARMBOARD 7.5X6 YLW CONV (MISCELLANEOUS) ×2 IMPLANT
RING MALYGIN (MISCELLANEOUS) IMPLANT
RING MALYGIN 7.0 (MISCELLANEOUS) IMPLANT
SYR TB 1ML LL NO SAFETY (SYRINGE) ×2 IMPLANT
TAPE SURG TRANSPORE 1 IN (GAUZE/BANDAGES/DRESSINGS) ×1 IMPLANT
TAPE SURGICAL TRANSPORE 1 IN (GAUZE/BANDAGES/DRESSINGS) ×1
TECNIS 1 PIECE IOL (Intraocular Lens) ×2 IMPLANT
VISCOELASTIC ADDITIONAL (OPHTHALMIC RELATED) IMPLANT
WATER STERILE IRR 250ML POUR (IV SOLUTION) ×2 IMPLANT

## 2020-04-15 NOTE — Anesthesia Postprocedure Evaluation (Signed)
Anesthesia Post Note  Patient: [name]  Procedure(s) Performed: CATARACT EXTRACTION PHACO AND INTRAOCULAR LENS PLACEMENT LEFT EYE (Left Eye)  Patient location during evaluation: Phase II Anesthesia Type: MAC Level of consciousness: awake and alert and oriented Pain management: pain level controlled Vital Signs Assessment: post-procedure vital signs reviewed and stable Respiratory status: spontaneous breathing, nonlabored ventilation and respiratory function stable Cardiovascular status: blood pressure returned to baseline and stable Postop Assessment: no apparent nausea or vomiting Anesthetic complications: no   No complications documented.   Last Vitals:  Vitals:   04/15/20 0846  BP: 139/81  Pulse: 84  Resp: 18  Temp: 36.6 C  SpO2: 99%    Last Pain:  Vitals:   04/15/20 0846  TempSrc: Oral  PainSc: 0-No pain                 Orlie Dakin

## 2020-04-15 NOTE — Transfer of Care (Signed)
Immediate Anesthesia Transfer of Care Note  Patient: Kenneth Reed  Procedure(s) Performed: CATARACT EXTRACTION PHACO AND INTRAOCULAR LENS PLACEMENT LEFT EYE (Left Eye)  Patient Location: Short Stay  Anesthesia Type:MAC  Level of Consciousness: awake, alert  and oriented  Airway & Oxygen Therapy: Patient Spontanous Breathing  Post-op Assessment: Report given to RN and Post -op Vital signs reviewed and stable  Post vital signs: Reviewed and stable  Last Vitals:  Vitals Value Taken Time  BP    Temp    Pulse    Resp    SpO2      Last Pain:  Vitals:   04/15/20 0846  TempSrc: Oral  PainSc: 0-No pain      Patients Stated Pain Goal: 5 (83/33/83 2919)  Complications: No complications documented.

## 2020-04-15 NOTE — Discharge Instructions (Signed)
Please discharge patient when stable, will follow up today with Dr. Costa Jha at the [facility] [place] office immediately following discharge.  Leave shield in place until visit.  All paperwork with discharge instructions will be given at the office.  [facility] Address:  [address]  [place], [place] [address]  

## 2020-04-15 NOTE — Interval H&P Note (Signed)
History and Physical Interval Note:  04/15/2020 9:26 AM  [name]  has presented today for surgery, with the diagnosis of Nuclear sclerotic cataract - Left eye.  The various methods of treatment have been discussed with the patient and family. After consideration of risks, benefits and other options for treatment, the patient has consented to  Procedure(s) with comments: CATARACT EXTRACTION PHACO AND INTRAOCULAR LENS PLACEMENT ([place]) (Left) - left as a surgical intervention.  The patient's history has been reviewed, patient examined, no change in status, stable for surgery.  I have reviewed the patient's chart and labs.  Questions were answered to the patient's satisfaction.     Baruch Goldmann

## 2020-04-15 NOTE — Anesthesia Preprocedure Evaluation (Signed)
Anesthesia Evaluation  Patient identified by MRN, date of birth, ID band Patient awake    Reviewed: Allergy & Precautions, H&P , NPO status , Patient's Chart, lab work & pertinent test results, reviewed documented beta blocker date and time   History of Anesthesia Complications Negative for: history of anesthetic complications  Airway Mallampati: II  TM Distance: >3 FB Neck ROM: full    Dental no notable dental hx. (+) Edentulous Upper, Edentulous Lower, Implants   Pulmonary neg pulmonary ROS,  COPD inhaler,    Pulmonary exam normal breath sounds clear to auscultation       Cardiovascular Exercise Tolerance: Good negative cardio ROS Normal cardiovascular exam Rhythm:regular Rate:Normal     Neuro/Psych negative neurological ROS  negative psych ROS   GI/Hepatic negative GI ROS, Neg liver ROS,   Endo/Other  negative endocrine ROS  Renal/GU Renal disease  negative genitourinary   Musculoskeletal negative musculoskeletal ROS (+)   Abdominal   Peds negative pediatric ROS (+)  Hematology negative hematology ROS (+)   Anesthesia Other Findings   Reproductive/Obstetrics negative OB ROS                             Anesthesia Physical  Anesthesia Plan  ASA: III  Anesthesia Plan: MAC   Post-op Pain Management:    Induction:   PONV Risk Score and Plan:   Airway Management Planned: Nasal Cannula and Natural Airway  Additional Equipment:   Intra-op Plan:   Post-operative Plan:   Informed Consent: I have reviewed the patients History and Physical, chart, labs and discussed the procedure including the risks, benefits and alternatives for the proposed anesthesia with the patient or authorized representative who has indicated his/her understanding and acceptance.     Dental Advisory Given  Plan Discussed with: CRNA  Anesthesia Plan Comments:         Anesthesia Quick  Evaluation

## 2020-04-15 NOTE — Anesthesia Procedure Notes (Signed)
Procedure Name: MAC Date/Time: 04/15/2020 9:38 AM Performed by: Orlie Dakin, CRNA Pre-anesthesia Checklist: Patient identified, Emergency Drugs available, Suction available and Patient being monitored Patient Re-evaluated:Patient Re-evaluated prior to induction Oxygen Delivery Method: Nasal cannula Placement Confirmation: positive ETCO2

## 2020-04-15 NOTE — Op Note (Addendum)
Date of procedure: 04/15/20  Pre-operative diagnosis: Visually significant combined form cataract, Left Eye; Poor dilation, Left eye (H25.812; H21.81)  Post-operative diagnosis: Visually significant cataract, Left Eye; Intra-operative Floppy Iris Syndrome, Left Eye  Procedure: Complex removal of cataract via phacoemulsification and insertion of intra-ocular lens [employer] DIB00  +21.5D into the capsular bag of the Left Eye (CPT [id number])  Attending surgeon: Gerda Diss. Johnita Palleschi, MD, MA  Anesthesia: MAC, Topical Akten  Complications: None  Estimated Blood Loss: <44m (minimal)  Specimens: None  Implants: As above  Indications:  Visually significant cataract, Left Eye  Procedure:  The patient was seen and identified in the pre-operative area. The operative eye was identified and dilated.  The operative eye was marked.  Topical anesthesia was administered to the operative eye.     The patient was then to the operative suite and placed in the supine position.  A timeout was performed confirming the patient, procedure to be performed, and all other relevant information.   The patient's face was prepped and draped in the usual fashion for intra-ocular surgery.  A lid speculum was placed into the operative eye and the surgical microscope moved into place and focused.  Poor dilation of the iris was confirmed.  An inferotemporal paracentesis was created using a 20 gauge paracentesis blade.  Shugarcaine was injected into the anterior chamber.  Viscoelastic was injected into the anterior chamber.  A temporal clear-corneal main wound incision was created using a 2.466mmicrokeratome.  A Malyugin ring was placed.  A continuous curvilinear capsulorrhexis was initiated using an irrigating cystitome and completed using capsulorrhexis forceps.  Hydrodissection and hydrodeliniation were performed.  Viscoelastic was injected into the anterior chamber.  A phacoemulsification handpiece and a chopper as a  second instrument were used to remove the nucleus and epinucleus. The irrigation/aspiration handpiece was used to remove any remaining cortical material.   The capsular bag was reinflated with viscoelastic, checked, and found to be intact.  The intraocular lens was inserted into the capsular bag and dialed into place using a Kuglen hook. The Malyugin ring was removed.  The irrigation/aspiration handpiece was used to remove any remaining viscoelastic.  The clear corneal wound and paracentesis wounds were then hydrated and checked with Weck-Cels to be watertight.  The lid-speculum and drape was removed, and the patient's face was cleaned with a wet and dry 4x4.  Maxitrol was instilled in the eye before a clear shield was taped over the eye. The patient was taken to the post-operative care unit in good condition, having tolerated the procedure well.  Post-Op Instructions: [facility] and will receive all other orders and instructions.

## 2020-04-18 ENCOUNTER — Encounter (HOSPITAL_COMMUNITY): Payer: Self-pay | Admitting: Ophthalmology

## 2020-05-18 ENCOUNTER — Ambulatory Visit (INDEPENDENT_AMBULATORY_CARE_PROVIDER_SITE_OTHER): Payer: Medicare Other | Admitting: Family

## 2020-05-18 ENCOUNTER — Encounter: Payer: Self-pay | Admitting: Family

## 2020-05-18 ENCOUNTER — Other Ambulatory Visit: Payer: Self-pay

## 2020-05-18 VITALS — BP 134/79 | HR 79 | Temp 97.7°F | Ht 71.0 in | Wt 139.6 lb

## 2020-05-18 DIAGNOSIS — E785 Hyperlipidemia, unspecified: Secondary | ICD-10-CM | POA: Diagnosis not present

## 2020-05-18 DIAGNOSIS — H9193 Unspecified hearing loss, bilateral: Secondary | ICD-10-CM | POA: Diagnosis not present

## 2020-05-18 DIAGNOSIS — Z8551 Personal history of malignant neoplasm of bladder: Secondary | ICD-10-CM | POA: Diagnosis not present

## 2020-05-18 DIAGNOSIS — E559 Vitamin D deficiency, unspecified: Secondary | ICD-10-CM | POA: Diagnosis not present

## 2020-05-18 DIAGNOSIS — R918 Other nonspecific abnormal finding of lung field: Secondary | ICD-10-CM

## 2020-05-18 DIAGNOSIS — J42 Unspecified chronic bronchitis: Secondary | ICD-10-CM | POA: Diagnosis not present

## 2020-05-18 DIAGNOSIS — N401 Enlarged prostate with lower urinary tract symptoms: Secondary | ICD-10-CM

## 2020-05-18 DIAGNOSIS — R350 Frequency of micturition: Secondary | ICD-10-CM

## 2020-05-18 DIAGNOSIS — I700 Atherosclerosis of aorta: Secondary | ICD-10-CM | POA: Diagnosis not present

## 2020-05-18 MED ORDER — ROSUVASTATIN CALCIUM 10 MG PO TABS: 10.0000 mg | ORAL_TABLET | Freq: Every day | ORAL | 3 refills | Status: DC

## 2020-05-18 MED ORDER — ASPIRIN EC 81 MG PO TBEC: 81.0000 mg | DELAYED_RELEASE_TABLET | Freq: Every day | ORAL | 4 refills | Status: DC

## 2020-05-18 NOTE — Progress Notes (Signed)
Subjective:    Patient ID: Kenneth Reed, male    DOB: 1945-06-09, 75 y.o.   MRN: [id number]  Chief Complaint  Patient presents with  . Medical Management of Chronic Issues    Patient gets all meds meds through the [place]  . COPD   Pt presents to the office today for chronic follow up. He is followed by the [facility] annually. He is followed by Urologists every 6 months for BPH and hx bladder cancer. He is followed by ENT as needed. He is followed by Pulmonologist annually for COPD and lung nodules.   He just had cataract surgery on bilateral eye in January 2021.  COPD There is no cough or shortness of breath. This is a chronic problem. The current episode started more than 1 year ago. The problem occurs intermittently. Pertinent negatives include no rhinorrhea. His past medical history is significant for COPD.  Hyperlipidemia This is a chronic problem. The current episode started more than 1 year ago. The problem is uncontrolled. Recent lipid tests were reviewed and are high. Pertinent negatives include no shortness of breath. He is currently on no antihyperlipidemic treatment. The current treatment provides no improvement of lipids. Risk factors for coronary artery disease include dyslipidemia, male sex and a sedentary lifestyle.  Benign Prostatic Hypertrophy This is a chronic problem. The current episode started more than 1 year ago. The problem has been waxing and waning since onset. Irritative symptoms include nocturia (3-4). Obstructive symptoms include straining.      Review of Systems  HENT: Negative for rhinorrhea.   Respiratory: Negative for cough and shortness of breath.   Genitourinary: Positive for nocturia (3-4).  All other systems reviewed and are negative.      Objective:   Physical Exam Vitals reviewed.  Constitutional:      General: He is not in acute distress.    Appearance: He is well-developed and well-nourished.  HENT:     Head: Normocephalic.     Right Ear:  Tympanic membrane normal.     Left Ear: Tympanic membrane normal.     Mouth/Throat:     Mouth: Oropharynx is clear and moist.  Eyes:     General:        Right eye: No discharge.        Left eye: No discharge.     Pupils: Pupils are equal, round, and reactive to light.  Neck:     Thyroid: No thyromegaly.  Cardiovascular:     Rate and Rhythm: Normal rate and regular rhythm.     Pulses: Intact distal pulses.     Heart sounds: Normal heart sounds. No murmur heard.   Pulmonary:     Effort: Pulmonary effort is normal. No respiratory distress.     Breath sounds: Normal breath sounds. No wheezing.  Abdominal:     General: Bowel sounds are normal. There is no distension.     Palpations: Abdomen is soft.     Tenderness: There is no abdominal tenderness.  Musculoskeletal:        General: No tenderness or edema. Normal range of motion.     Cervical back: Normal range of motion and neck supple.  Skin:    General: Skin is warm and dry.     Findings: No erythema or rash.  Neurological:     Mental Status: He is alert and oriented to person, place, and time.     Cranial Nerves: No cranial nerve deficit.     Deep Tendon Reflexes:  Reflexes are normal and symmetric.  Psychiatric:        Mood and Affect: Mood and affect normal.        Behavior: Behavior normal.        Thought Content: Thought content normal.        Judgment: Judgment normal.     BP 134/79   Pulse 79   Temp 97.7 F (36.5 C) (Temporal)   Ht '5\' 11"'  (1.803 m)   Wt 139 lb 9.6 oz (63.3 kg)   SpO2 99%   BMI 19.47 kg/m      Assessment & Plan:  Kenneth Reed comes in today with chief complaint of Medical Management of Chronic Issues (Patient gets all meds meds through the [place]) and COPD   Diagnosis and orders addressed:  1. Aortic atherosclerosis (HCC) - rosuvastatin (CRESTOR) 10 MG tablet; Take 1 tablet (10 mg total) by mouth daily.  Dispense: 90 tablet; Refill: 3 - CMP14+EGFR - CBC with Differential/Platelet -  TSH  2. Chronic bronchitis, unspecified chronic bronchitis type ([place]) - CMP14+EGFR - CBC with Differential/Platelet - TSH  3. Bilateral hearing loss, unspecified hearing loss type - CMP14+EGFR - CBC with Differential/Platelet - TSH  4. Benign prostatic hyperplasia with urinary frequency - CMP14+EGFR - CBC with Differential/Platelet - TSH  5. Vitamin D deficiency - CMP14+EGFR - CBC with Differential/Platelet - TSH  6. History of bladder cancer - CMP14+EGFR - CBC with Differential/Platelet - TSH  7. Multiple lung nodules on CT - CMP14+EGFR - CBC with Differential/Platelet - TSH  8. Hyperlipidemia, unspecified hyperlipidemia type Will start Crestor 10 mg today Low fat diet  - rosuvastatin (CRESTOR) 10 MG tablet; Take 1 tablet (10 mg total) by mouth daily.  Dispense: 90 tablet; Refill: 3 - CMP14+EGFR - CBC with Differential/Platelet - Lipid panel - TSH   Labs pending Health Maintenance reviewed Diet and exercise encouraged  Follow up plan: 6 months    Evelina Dun, FNP

## 2020-05-18 NOTE — Patient Instructions (Signed)

## 2020-05-19 LAB — CMP14+EGFR
ALT: 13 IU/L (ref 0–44)
AST: 22 IU/L (ref 0–40)
Albumin/Globulin Ratio: 1.7 (ref 1.2–2.2)
Albumin: 4.7 g/dL (ref 3.7–4.7)
Alkaline Phosphatase: 65 IU/L (ref 44–121)
BUN/Creatinine Ratio: 11 (ref 10–24)
BUN: 11 mg/dL (ref 8–27)
Bilirubin Total: 0.8 mg/dL (ref 0.0–1.2)
CO2: 21 mmol/L (ref 20–29)
Calcium: 9.6 mg/dL (ref 8.6–10.2)
Chloride: 101 mmol/L (ref 96–106)
Creatinine, Ser: 1.04 mg/dL (ref 0.76–1.27)
GFR calc Af Amer: 80 mL/min/{1.73_m2} (ref >59)
GFR calc non Af Amer: 69 mL/min/{1.73_m2} (ref >59)
Globulin, Total: 2.7 g/dL (ref 1.5–4.5)
Glucose: 99 mg/dL (ref 65–99)
Potassium: 5.2 mmol/L (ref 3.5–5.2)
Sodium: 142 mmol/L (ref 134–144)
Total Protein: 7.4 g/dL (ref 6.0–8.5)

## 2020-05-19 LAB — LIPID PANEL
Chol/HDL Ratio: 2.8 ratio (ref 0.0–5.0)
Cholesterol, Total: 213 mg/dL — ABNORMAL HIGH (ref 100–199)
HDL: 75 mg/dL (ref >39)
LDL Chol Calc (NIH): 127 mg/dL — ABNORMAL HIGH (ref 0–99)
Triglycerides: 60 mg/dL (ref 0–149)
VLDL Cholesterol Cal: 11 mg/dL (ref 5–40)

## 2020-05-19 LAB — CBC WITH DIFFERENTIAL/PLATELET
Basophils Absolute: 0.1 10*3/uL (ref 0.0–0.2)
Basos: 1 %
EOS (ABSOLUTE): 0.2 10*3/uL (ref 0.0–0.4)
Eos: 5 %
Hematocrit: 45.2 % (ref 37.5–51.0)
Hemoglobin: 15.6 g/dL (ref 13.0–17.7)
Immature Grans (Abs): 0 10*3/uL (ref 0.0–0.1)
Immature Granulocytes: 0 %
Lymphocytes Absolute: 1.1 10*3/uL (ref 0.7–3.1)
Lymphs: 27 %
MCH: 31.6 pg (ref 26.6–33.0)
MCHC: 34.5 g/dL (ref 31.5–35.7)
MCV: 92 fL (ref 79–97)
Monocytes Absolute: 0.3 10*3/uL (ref 0.1–0.9)
Monocytes: 7 %
Neutrophils Absolute: 2.4 10*3/uL (ref 1.4–7.0)
Neutrophils: 60 %
Platelets: 245 10*3/uL (ref 150–450)
RBC: 4.94 x10E6/uL (ref 4.14–5.80)
RDW: 11.8 % (ref 11.6–15.4)
WBC: 4 10*3/uL (ref 3.4–10.8)

## 2020-05-19 LAB — TSH: TSH: 3.52 u[IU]/mL (ref 0.450–4.500)

## 2020-06-29 ENCOUNTER — Ambulatory Visit (INDEPENDENT_AMBULATORY_CARE_PROVIDER_SITE_OTHER): Payer: Medicare Other | Admitting: Urology

## 2020-06-29 ENCOUNTER — Other Ambulatory Visit: Payer: Self-pay

## 2020-06-29 VITALS — BP 134/70 | HR 89 | Temp 97.7°F | Ht 71.0 in | Wt 140.0 lb

## 2020-06-29 DIAGNOSIS — Z8551 Personal history of malignant neoplasm of bladder: Secondary | ICD-10-CM

## 2020-06-29 LAB — URINALYSIS, ROUTINE W REFLEX MICROSCOPIC
Bilirubin, UA: NEGATIVE
Nitrite, UA: NEGATIVE
Protein,UA: NEGATIVE
RBC, UA: NEGATIVE
Specific Gravity, UA: 1.02 (ref 1.005–1.030)
Urobilinogen, Ur: 0.2 mg/dL (ref 0.2–1.0)
pH, UA: 7 (ref 5.0–7.5)

## 2020-06-29 LAB — MICROSCOPIC EXAMINATION
Bacteria, UA: NONE SEEN
RBC, Urine: NONE SEEN /hpf (ref 0–2)
Renal Epithel, UA: NONE SEEN /hpf

## 2020-06-29 MED ORDER — CIPROFLOXACIN HCL 500 MG PO TABS
500.0000 mg | ORAL_TABLET | Freq: Once | ORAL | Status: AC
  Administered 2020-06-29: 500 mg via ORAL

## 2020-06-29 NOTE — Progress Notes (Signed)
Urological Symptom Review  Patient is experiencing the following symptoms: Frequent urination Hard to postpone urination Get up at night to urinate Stream starts and stops Trouble starting stream   Review of Systems  Gastrointestinal (upper)  : Negative for upper GI symptoms  Gastrointestinal (lower) : Negative for lower GI symptoms  Constitutional : Negative for symptoms  Skin: Negative for skin symptoms  Eyes: Negative for eye symptoms  Ear/Nose/Throat : Negative for Ear/Nose/Throat symptoms  Hematologic/Lymphatic: Negative for Hematologic/Lymphatic symptoms  Cardiovascular : Negative for cardiovascular symptoms  Respiratory : Negative for respiratory symptoms  Endocrine: Negative for endocrine symptoms  Musculoskeletal: Negative for musculoskeletal symptoms  Neurological: Negative for neurological symptoms  Psychologic: Negative for psychiatric symptoms

## 2020-06-29 NOTE — Progress Notes (Signed)
   06/29/20  CC: followup bladder tumor  HPI: [name] is a 75yo here for followup for PUNLMP.  Blood pressure 134/70, pulse 89, temperature 97.7 F (36.5 C), height 5\' 11"  (1.803 m), weight 140 lb (63.5 kg). NED. A&Ox3.   No respiratory distress   Abd soft, NT, ND Normal phallus with bilateral descended testicles  Cystoscopy Procedure Note  Patient identification was confirmed, informed consent was obtained, and patient was prepped using Betadine solution.  Lidocaine jelly was administered per urethral meatus.     Pre-Procedure: - Inspection reveals a normal caliber ureteral meatus.  Procedure: The flexible cystoscope was introduced without difficulty - No urethral strictures/lesions are present. - Enlarged prostate  - Normal bladder neck - Bilateral ureteral orifices identified - Bladder mucosa  reveals no ulcers, tumors, or lesions - No bladder stones - No trabeculation    Post-Procedure: - Patient tolerated the procedure well  Assessment/ Plan: RTC 6 months for cystoscopy Patient to contact PCP for Diabetes workup since his UA today showed Glucosuria  No follow-ups on file.  [name], MD

## 2020-07-05 ENCOUNTER — Encounter: Payer: Self-pay | Admitting: Urology

## 2020-07-05 NOTE — Patient Instructions (Signed)
Cystoscopy Cystoscopy is a procedure that is used to help diagnose and sometimes treat conditions that affect the lower urinary tract. The lower urinary tract includes the bladder and the urethra. The urethra is the tube that drains urine from the bladder. Cystoscopy is done using a thin, tube-shaped instrument with a light and camera at the end (cystoscope). The cystoscope may be hard or flexible, depending on the goal of the procedure. The cystoscope is inserted through the urethra, into the bladder. Cystoscopy may be recommended if you have:  Urinary tract infections that keep coming back.  Blood in the urine (hematuria).  An inability to control when you urinate (urinary incontinence) or an overactive bladder.  Unusual cells found in a urine sample.  A blockage in the urethra, such as a urinary stone.  Painful urination.  An abnormality in the bladder found during an intravenous pyelogram (IVP) or CT scan. Cystoscopy may also be done to remove a sample of tissue to be examined under a microscope (biopsy). Tell a health care provider about:  Any allergies you have.  All medicines you are taking, including vitamins, herbs, eye drops, creams, and over-the-counter medicines.  Any problems you or family members have had with anesthetic medicines.  Any blood disorders you have.  Any surgeries you have had.  Any medical conditions you have.  Whether you are pregnant or may be pregnant. What are the risks? Generally, this is a safe procedure. However, problems may occur, including:  Infection.  Bleeding.  Allergic reactions to medicines.  Damage to other structures or organs. What happens before the procedure? Medicines Ask your health care provider about:  Changing or stopping your regular medicines. This is especially important if you are taking diabetes medicines or blood thinners.  Taking medicines such as aspirin and ibuprofen. These medicines can thin your blood. Do  not take these medicines unless your health care provider tells you to take them.  Taking over-the-counter medicines, vitamins, herbs, and supplements. Tests You may have an exam or testing, such as:  X-rays of the bladder, urethra, or kidneys.  CT scan of the abdomen or pelvis.  Urine tests to check for signs of infection. General instructions  Follow instructions from your health care provider about eating or drinking restrictions.  Ask your health care provider what steps will be taken to help prevent infection. These steps may include: ? Washing skin with a germ-killing soap. ? Taking antibiotic medicine.  Plan to have a responsible adult take you home from the hospital or clinic. What happens during the procedure?  You will be given one or more of the following: ? A medicine to help you relax (sedative). ? A medicine to numb the area (local anesthetic).  The area around the opening of your urethra will be cleaned.  The cystoscope will be passed through your urethra into your bladder.  Germ-free (sterile) fluid will flow through the cystoscope to fill your bladder. The fluid will stretch your bladder so that your health care provider can clearly examine your bladder walls.  Your doctor will look at the urethra and bladder. Your doctor may take a biopsy or remove stones.  The cystoscope will be removed, and your bladder will be emptied. The procedure may vary among health care providers and hospitals.   What can I expect after the procedure? After the procedure, it is common to have:  Some soreness or pain in your abdomen and urethra.  Urinary symptoms. These include: ? Mild pain or burning  when you urinate. Pain should stop within a few minutes after you urinate. This may last for up to 1 week. ? A small amount of blood in your urine for several days. ? Feeling like you need to urinate but producing only a small amount of urine. Follow these instructions at  home: Medicines  Take over-the-counter and prescription medicines only as told by your health care provider.  If you were prescribed an antibiotic medicine, take it as told by your health care provider. Do not stop taking the antibiotic even if you start to feel better. General instructions  Return to your normal activities as told by your health care provider. Ask your health care provider what activities are safe for you.  If you were given a sedative during the procedure, it can affect you for several hours. Do not drive or operate machinery until your health care provider says that it is safe.  Watch for any blood in your urine. If the amount of blood in your urine increases, call your health care provider.  Follow instructions from your health care provider about eating or drinking restrictions.  If a tissue sample was removed for testing (biopsy) during your procedure, it is up to you to get your test results. Ask your health care provider, or the department that is doing the test, when your results will be ready.  Drink enough fluid to keep your urine pale yellow.  Keep all follow-up visits. This is important. Contact a health care provider if:  You have pain that gets worse or does not get better with medicine, especially pain when you urinate.  You have trouble urinating.  You have more blood in your urine. Get help right away if:  You have blood clots in your urine.  You have abdominal pain.  You have a fever or chills.  You are unable to urinate. Summary  Cystoscopy is a procedure that is used to help diagnose and sometimes treat conditions that affect the lower urinary tract.  Cystoscopy is done using a thin, tube-shaped instrument with a light and camera at the end.  After the procedure, it is common to have some soreness or pain in your abdomen and urethra.  Watch for any blood in your urine. If the amount of blood in your urine increases, call your health  care provider.  If you were prescribed an antibiotic medicine, take it as told by your health care provider. Do not stop taking the antibiotic even if you start to feel better. This information is not intended to replace advice given to you by your health care provider. Make sure you discuss any questions you have with your health care provider. Document Revised: 11/06/2019 Document Reviewed: 11/06/2019 Elsevier Patient Education  [place].

## 2020-07-12 ENCOUNTER — Ambulatory Visit (INDEPENDENT_AMBULATORY_CARE_PROVIDER_SITE_OTHER): Payer: Medicare Other | Admitting: Family

## 2020-07-12 ENCOUNTER — Encounter: Payer: Self-pay | Admitting: Family

## 2020-07-12 ENCOUNTER — Other Ambulatory Visit: Payer: Self-pay

## 2020-07-12 VITALS — BP 147/81 | HR 100 | Temp 97.3°F | Ht 71.0 in | Wt 133.8 lb

## 2020-07-12 DIAGNOSIS — S40861A Insect bite (nonvenomous) of right upper arm, initial encounter: Secondary | ICD-10-CM

## 2020-07-12 DIAGNOSIS — W57XXXA Bitten or stung by nonvenomous insect and other nonvenomous arthropods, initial encounter: Secondary | ICD-10-CM | POA: Diagnosis not present

## 2020-07-12 DIAGNOSIS — R7309 Other abnormal glucose: Secondary | ICD-10-CM

## 2020-07-12 DIAGNOSIS — I700 Atherosclerosis of aorta: Secondary | ICD-10-CM | POA: Diagnosis not present

## 2020-07-12 LAB — BAYER DCA HB A1C WAIVED: HB A1C (BAYER DCA - WAIVED): 5 % (ref <7.0)

## 2020-07-12 LAB — GLUCOSE HEMOCUE WAIVED: Glu Hemocue Waived: 92 mg/dL (ref 65–99)

## 2020-07-12 NOTE — Progress Notes (Signed)
Subjective:    Patient ID: Kenneth Reed, male    DOB: [birth year], 75 y.o.   MRN: [id number]  Chief Complaint  Patient presents with  . Follow-up    From Urology his sugar was 03/23. Sugar      HPI Pt presents to the office today for elevated glucose.  He states he was told his glucose was 170 on 03/23. Told to follow up with [place]. He had lab work on 05/18/20 and his glucose was 99.  He dose report that he was not fasting when he saw his Urologists. Denies polydipsia, polyphagia, and polyuria.   He is also complaining of a "bump" on his right inner arm that he noticed over the last few days. He reports it itches. Denies any fever or discharge.    Review of Systems  All other systems reviewed and are negative.      Objective:   Physical Exam Vitals reviewed.  Constitutional:      General: He is not in acute distress.    Appearance: He is well-developed.  HENT:     Head: Normocephalic.     Right Ear: Tympanic membrane normal.     Left Ear: Tympanic membrane normal.  Eyes:     General:        Right eye: No discharge.        Left eye: No discharge.     Pupils: Pupils are equal, round, and reactive to light.  Neck:     Thyroid: No thyromegaly.  Cardiovascular:     Rate and Rhythm: Normal rate and regular rhythm.     Heart sounds: Normal heart sounds. No murmur heard.   Pulmonary:     Effort: Pulmonary effort is normal. No respiratory distress.     Breath sounds: Normal breath sounds. No wheezing.  Abdominal:     General: Bowel sounds are normal. There is no distension.     Palpations: Abdomen is soft.     Tenderness: There is no abdominal tenderness.  Musculoskeletal:        General: No tenderness. Normal range of motion.     Cervical back: Normal range of motion and neck supple.  Skin:    General: Skin is warm and dry.     Findings: No erythema or rash.          Comments: nickel size raised area on right [facility]  Neurological:     Mental Status: He is alert and  oriented to person, place, and time.     Cranial Nerves: No cranial nerve deficit.     Deep Tendon Reflexes: Reflexes are normal and symmetric.  Psychiatric:        Behavior: Behavior normal.        Thought Content: Thought content normal.        Judgment: Judgment normal.          BP (!) 147/81   Pulse 100   Temp (!) 97.3 F (36.3 C) (Temporal)   Ht 5\' 11"  (1.803 m)   Wt 133 lb 12.8 oz (60.7 kg)   BMI 18.66 kg/m   Assessment & Plan:  Kenneth Reed comes in today with chief complaint of Follow-up (From Urology his sugar was 03/23. Sugar )   Diagnosis and orders addressed:  1. Elevated glucose Normal today, elevation must have been from not fasting - Bayer DCA Hb A1c Waived - Glucose Hemocue Waived  2. Insect bite of right upper arm, initial encounter Apply hydrocortisone cream  Follow up plan: 4 months   Evelina Dun, FNP

## 2020-07-14 ENCOUNTER — Other Ambulatory Visit: Payer: Self-pay | Admitting: Family Medicine

## 2020-07-14 DIAGNOSIS — J42 Unspecified chronic bronchitis: Secondary | ICD-10-CM

## 2020-07-14 DIAGNOSIS — J441 Chronic obstructive pulmonary disease with (acute) exacerbation: Secondary | ICD-10-CM

## 2020-07-14 MED ORDER — ALBUTEROL SULFATE HFA 108 (90 BASE) MCG/ACT IN AERS: 2.0000 | INHALATION_SPRAY | RESPIRATORY_TRACT | 2 refills | Status: DC | PRN

## 2020-08-09 DIAGNOSIS — J441 Chronic obstructive pulmonary disease with (acute) exacerbation: Secondary | ICD-10-CM | POA: Diagnosis not present

## 2020-10-04 ENCOUNTER — Ambulatory Visit (INDEPENDENT_AMBULATORY_CARE_PROVIDER_SITE_OTHER): Payer: Medicare Other | Admitting: Family Medicine

## 2020-10-04 ENCOUNTER — Encounter: Payer: Self-pay | Admitting: Family Medicine

## 2020-10-04 DIAGNOSIS — J441 Chronic obstructive pulmonary disease with (acute) exacerbation: Secondary | ICD-10-CM | POA: Diagnosis not present

## 2020-10-04 MED ORDER — PREDNISONE 10 MG PO TABS: ORAL_TABLET | ORAL | 0 refills | Status: DC

## 2020-10-04 MED ORDER — SPIRIVA RESPIMAT 2.5 MCG/ACT IN AERS: 1.0000 | INHALATION_SPRAY | Freq: Every day | RESPIRATORY_TRACT | 11 refills | Status: DC

## 2020-10-04 MED ORDER — AMOXICILLIN-POT CLAVULANATE 875-125 MG PO TABS: 1.0000 | ORAL_TABLET | Freq: Two times a day (BID) | ORAL | 0 refills | Status: DC

## 2020-10-04 NOTE — Progress Notes (Signed)
Subjective:    Patient ID: Kenneth Reed, male    DOB: [birth year], 75 y.o.   MRN: [id number]   HPI: Kenneth Reed is a 75 y.o. male presenting for tightness in chest. Coughing up sputum. Onset 2 months ago. On May 3, had a course of prednisone. Intermittent dyspnea. No fever. Never a smoker. He does have COPD. Using generic advair BID. Sees pulmonology at [facility]. Uses a generic for advair BID. No relief with it. Sx stable, but not improving.   Depression screen [facility] 2/9 07/12/2020 05/18/2020 02/16/2020 03/11/2018 09/20/2017  Decreased Interest 0 0 0 0 0  Down, Depressed, Hopeless 0 0 0 1 0  PHQ - 2 Score 0 0 0 1 0  Altered sleeping - - - - -  Tired, decreased energy - - - - -  Change in appetite - - - - -  Feeling bad or failure about yourself  - - - - -  Trouble concentrating - - - - -  Moving slowly or fidgety/restless - - - - -  Suicidal thoughts - - - - -  PHQ-9 Score - - - - -     Relevant past medical, surgical, family and social history reviewed and updated as indicated.  Interim medical history since our last visit reviewed. Allergies and medications reviewed and updated.  ROS:  Review of Systems  Constitutional:  Negative for fever.  Respiratory:  Negative for shortness of breath.   Cardiovascular:  Negative for chest pain.  Musculoskeletal:  Negative for arthralgias.  Skin:  Negative for rash.    Social History   Tobacco Use  Smoking Status Never  Smokeless Tobacco Former   Types: Chew   Quit date: 2010       Objective:     Wt Readings from Last 3 Encounters:  07/12/20 133 lb 12.8 oz (60.7 kg)  06/29/20 140 lb (63.5 kg)  05/18/20 139 lb 9.6 oz (63.3 kg)     Exam deferred. Pt. [place] due to COVID 19. Phone visit performed.   Assessment & Plan:   1. COPD exacerbation ([place])     Meds ordered this encounter  Medications   Tiotropium Bromide Monohydrate (SPIRIVA RESPIMAT) 2.5 MCG/ACT AERS    Sig: Inhale 1 puff into the lungs daily.     Dispense:  1 each    Refill:  11   predniSONE (DELTASONE) 10 MG tablet    Sig: Take 5 daily for 3 days followed by 4,3,2 and 1 for 3 days each.    Dispense:  45 tablet    Refill:  0   amoxicillin-clavulanate (AUGMENTIN) 875-125 MG tablet    Sig: Take 1 tablet by mouth 2 (two) times daily. Take all of this medication    Dispense:  20 tablet    Refill:  0    No orders of the defined types were placed in this encounter.     Diagnoses and all orders for this visit:  COPD exacerbation ([name])  Other orders -     Tiotropium Bromide Monohydrate (SPIRIVA RESPIMAT) 2.5 MCG/ACT AERS; Inhale 1 puff into the lungs daily. -     predniSONE (DELTASONE) 10 MG tablet; Take 5 daily for 3 days followed by 4,3,2 and 1 for 3 days each. -     amoxicillin-clavulanate (AUGMENTIN) 875-125 MG tablet; Take 1 tablet by mouth 2 (two) times daily. Take all of this medication  CXR recommended. HE will try to have it done through the [place].  Virtual  Visit via telephone Note  I discussed the limitations, risks, security and privacy concerns of performing an evaluation and management service by telephone and the availability of in person appointments. The patient was identified with two identifiers. Pt.expressed understanding and agreed to proceed. Pt. Is at home. Dr. Livia Snellen is in his office.  Follow Up Instructions:   I discussed the assessment and treatment plan with the patient. The patient was provided an opportunity to ask questions and all were answered. The patient agreed with the plan and demonstrated an understanding of the instructions.   The patient was advised to call back or seek an in-person evaluation if the symptoms worsen or if the condition fails to improve as anticipated.   Total minutes including chart review and phone contact time: 13   Follow up plan: Return if symptoms worsen or fail to improve.  Claretta Fraise, MD [place]

## 2020-11-08 DIAGNOSIS — J8410 Pulmonary fibrosis, unspecified: Secondary | ICD-10-CM | POA: Diagnosis not present

## 2020-11-08 DIAGNOSIS — R918 Other nonspecific abnormal finding of lung field: Secondary | ICD-10-CM | POA: Diagnosis not present

## 2020-11-15 ENCOUNTER — Encounter: Payer: Self-pay | Admitting: Family

## 2020-11-15 ENCOUNTER — Ambulatory Visit (INDEPENDENT_AMBULATORY_CARE_PROVIDER_SITE_OTHER): Payer: Medicare Other | Admitting: Family

## 2020-11-15 ENCOUNTER — Other Ambulatory Visit: Payer: Self-pay

## 2020-11-15 VITALS — BP 149/86 | HR 83 | Temp 98.3°F | Ht 71.0 in | Wt 134.6 lb

## 2020-11-15 DIAGNOSIS — J441 Chronic obstructive pulmonary disease with (acute) exacerbation: Secondary | ICD-10-CM | POA: Diagnosis not present

## 2020-11-15 DIAGNOSIS — C8599 Non-Hodgkin lymphoma, unspecified, extranodal and solid organ sites: Secondary | ICD-10-CM

## 2020-11-15 DIAGNOSIS — K649 Unspecified hemorrhoids: Secondary | ICD-10-CM

## 2020-11-15 DIAGNOSIS — R350 Frequency of micturition: Secondary | ICD-10-CM

## 2020-11-15 DIAGNOSIS — Z8551 Personal history of malignant neoplasm of bladder: Secondary | ICD-10-CM | POA: Diagnosis not present

## 2020-11-15 DIAGNOSIS — J42 Unspecified chronic bronchitis: Secondary | ICD-10-CM | POA: Diagnosis not present

## 2020-11-15 DIAGNOSIS — E785 Hyperlipidemia, unspecified: Secondary | ICD-10-CM

## 2020-11-15 DIAGNOSIS — E559 Vitamin D deficiency, unspecified: Secondary | ICD-10-CM

## 2020-11-15 DIAGNOSIS — I700 Atherosclerosis of aorta: Secondary | ICD-10-CM | POA: Diagnosis not present

## 2020-11-15 DIAGNOSIS — N401 Enlarged prostate with lower urinary tract symptoms: Secondary | ICD-10-CM

## 2020-11-15 MED ORDER — HYDROCORTISONE ACETATE 25 MG RE SUPP: 25.0000 mg | Freq: Two times a day (BID) | RECTAL | 2 refills | Status: DC

## 2020-11-15 MED ORDER — ALBUTEROL SULFATE HFA 108 (90 BASE) MCG/ACT IN AERS: 2.0000 | INHALATION_SPRAY | RESPIRATORY_TRACT | 2 refills | Status: AC | PRN

## 2020-11-15 NOTE — Patient Instructions (Signed)

## 2020-11-15 NOTE — Progress Notes (Addendum)
Subjective:    Patient ID: Kenneth Reed, male    DOB: [birth year], 75 y.o.   MRN: [id number]  Chief Complaint  Patient presents with   Medical Management of Chronic Issues   Hemorrhoids    Are bleeding wants to know what he can do about that    Pt presents to the office today for chronic follow up. He is followed by the [facility] annually. He is followed by Urologists every 6 months for BPH and hx bladder cancer. He is followed by ENT as needed. He is followed by Pulmonologist annually for COPD and lung nodules.  He is complaining of hemorrhoids over the last few days. He reports he had diarrhea earlier in the week and flared them up.  Benign Prostatic Hypertrophy This is a chronic problem. The current episode started more than 1 year ago. The problem has been waxing and waning since onset. Irritative symptoms include nocturia (2).  Hyperlipidemia This is a chronic problem. The current episode started more than 1 year ago. The problem is uncontrolled. Recent lipid tests were reviewed and are high. Current antihyperlipidemic treatment includes statins. The current treatment provides moderate improvement of lipids. Risk factors for coronary artery disease include dyslipidemia, male sex, hypertension and a sedentary lifestyle.   COPD Using Spiriva daily, and using albuterol as needed. Has not had to use in over a week. Not smoking. States breathing is stable.   Review of Systems  Genitourinary:  Positive for nocturia (2).  All other systems reviewed and are negative.     Objective:   Physical Exam Vitals reviewed.  Constitutional:      General: He is not in acute distress.    Appearance: He is well-developed.  HENT:     Head: Normocephalic.     Right Ear: External ear normal.     Left Ear: External ear normal.  Eyes:     General:        Right eye: No discharge.        Left eye: No discharge.     Pupils: Pupils are equal, round, and reactive to light.  Neck:     Thyroid: No  thyromegaly.  Cardiovascular:     Rate and Rhythm: Normal rate and regular rhythm.     Heart sounds: Normal heart sounds. No murmur heard. Pulmonary:     Effort: Pulmonary effort is normal. No respiratory distress.     Breath sounds: Normal breath sounds. No wheezing.  Abdominal:     General: Bowel sounds are normal. There is no distension.     Palpations: Abdomen is soft.     Tenderness: There is no abdominal tenderness.  Musculoskeletal:        General: No tenderness. Normal range of motion.     Cervical back: Normal range of motion and neck supple.  Skin:    General: Skin is warm and dry.     Findings: No erythema or rash.  Neurological:     Mental Status: He is alert and oriented to person, place, and time.     Cranial Nerves: No cranial nerve deficit.     Deep Tendon Reflexes: Reflexes are normal and symmetric.  Psychiatric:        Behavior: Behavior normal.        Thought Content: Thought content normal.        Judgment: Judgment normal.         BP (!) 149/86   Pulse 83   Temp 98.3 F (36.8  C) (Temporal)   Ht 5' 11" (1.803 m)   Wt 134 lb 9.6 oz (61.1 kg)   SpO2 98%   BMI 18.77 kg/m   Assessment & Plan:  Kenneth Reed comes in today with chief complaint of Medical Management of Chronic Issues and Hemorrhoids (Are bleeding wants to know what he can do about that )   Diagnosis and orders addressed:  1. Chronic bronchitis, unspecified chronic bronchitis type (HCC) - albuterol (VENTOLIN HFA) 108 (90 Base) MCG/ACT inhaler; Inhale 2 puffs into the lungs every 4 (four) hours as needed.  Dispense: 1 each; Refill: 2 - CMP14+EGFR - CBC with Differential/Platelet  2. COPD exacerbation (HCC) - albuterol (VENTOLIN HFA) 108 (90 Base) MCG/ACT inhaler; Inhale 2 puffs into the lungs every 4 (four) hours as needed.  Dispense: 1 each; Refill: 2 - CMP14+EGFR - CBC with Differential/Platelet  3. Aortic atherosclerosis (HCC)  - CMP14+EGFR - CBC with  Differential/Platelet  4. Non-Hodgkin's lymphoma of tonsil ([place]) - CMP14+EGFR - CBC with Differential/Platelet  5. Benign prostatic hyperplasia with urinary frequency  - CMP14+EGFR - CBC with Differential/Platelet  6. Hyperlipidemia, unspecified hyperlipidemia type - CMP14+EGFR - CBC with Differential/Platelet  7. History of bladder cancer - CMP14+EGFR - CBC with Differential/Platelet  8. Vitamin D deficiency  - CMP14+EGFR - CBC with Differential/Platelet - VITAMIN D 25 Hydroxy (Vit-D Deficiency, Fractures)  9. Hemorrhoids, unspecified hemorrhoid type Avoid constipation  - hydrocortisone (ANUSOL-HC) 25 MG suppository; Place 1 suppository (25 mg total) rectally 2 (two) times daily.  Dispense: 12 suppository; Refill: 2  Labs pending Health Maintenance reviewed Diet and exercise encouraged  Follow up plan: 6 months    Evelina Dun, FNP

## 2020-11-15 NOTE — Addendum Note (Signed)
Addended by: Evelina Dun A on: 11/15/2020 10:13 AM   Modules accepted: Orders

## 2020-11-16 ENCOUNTER — Other Ambulatory Visit: Payer: Medicare Other

## 2020-11-16 ENCOUNTER — Ambulatory Visit: Payer: Medicare Other

## 2020-11-16 DIAGNOSIS — I700 Atherosclerosis of aorta: Secondary | ICD-10-CM | POA: Diagnosis not present

## 2020-11-16 DIAGNOSIS — N401 Enlarged prostate with lower urinary tract symptoms: Secondary | ICD-10-CM | POA: Diagnosis not present

## 2020-11-16 DIAGNOSIS — J441 Chronic obstructive pulmonary disease with (acute) exacerbation: Secondary | ICD-10-CM | POA: Diagnosis not present

## 2020-11-16 DIAGNOSIS — R350 Frequency of micturition: Secondary | ICD-10-CM | POA: Diagnosis not present

## 2020-11-16 DIAGNOSIS — Z8551 Personal history of malignant neoplasm of bladder: Secondary | ICD-10-CM | POA: Diagnosis not present

## 2020-11-16 DIAGNOSIS — C8599 Non-Hodgkin lymphoma, unspecified, extranodal and solid organ sites: Secondary | ICD-10-CM | POA: Diagnosis not present

## 2020-11-16 DIAGNOSIS — J42 Unspecified chronic bronchitis: Secondary | ICD-10-CM | POA: Diagnosis not present

## 2020-11-16 DIAGNOSIS — E559 Vitamin D deficiency, unspecified: Secondary | ICD-10-CM | POA: Diagnosis not present

## 2020-11-16 DIAGNOSIS — E785 Hyperlipidemia, unspecified: Secondary | ICD-10-CM | POA: Diagnosis not present

## 2020-11-17 LAB — CMP14+EGFR
ALT: 10 IU/L (ref 0–44)
AST: 18 IU/L (ref 0–40)
Albumin/Globulin Ratio: 2 (ref 1.2–2.2)
Albumin: 4.7 g/dL (ref 3.7–4.7)
Alkaline Phosphatase: 75 IU/L (ref 44–121)
BUN/Creatinine Ratio: 8 — ABNORMAL LOW (ref 10–24)
BUN: 8 mg/dL (ref 8–27)
Bilirubin Total: 0.8 mg/dL (ref 0.0–1.2)
CO2: 24 mmol/L (ref 20–29)
Calcium: 9.4 mg/dL (ref 8.6–10.2)
Chloride: 101 mmol/L (ref 96–106)
Creatinine, Ser: 1.01 mg/dL (ref 0.76–1.27)
Globulin, Total: 2.3 g/dL (ref 1.5–4.5)
Glucose: 103 mg/dL — ABNORMAL HIGH (ref 65–99)
Potassium: 4.3 mmol/L (ref 3.5–5.2)
Sodium: 140 mmol/L (ref 134–144)
Total Protein: 7 g/dL (ref 6.0–8.5)
eGFR: 77 mL/min/{1.73_m2} (ref >59)

## 2020-11-17 LAB — CBC WITH DIFFERENTIAL/PLATELET
Basophils Absolute: 0.1 10*3/uL (ref 0.0–0.2)
Basos: 1 %
EOS (ABSOLUTE): 0.1 10*3/uL (ref 0.0–0.4)
Eos: 3 %
Hematocrit: 45.4 % (ref 37.5–51.0)
Hemoglobin: 15.5 g/dL (ref 13.0–17.7)
Immature Grans (Abs): 0 10*3/uL (ref 0.0–0.1)
Immature Granulocytes: 0 %
Lymphocytes Absolute: 1.6 10*3/uL (ref 0.7–3.1)
Lymphs: 31 %
MCH: 32 pg (ref 26.6–33.0)
MCHC: 34.1 g/dL (ref 31.5–35.7)
MCV: 94 fL (ref 79–97)
Monocytes Absolute: 0.3 10*3/uL (ref 0.1–0.9)
Monocytes: 6 %
Neutrophils Absolute: 3 10*3/uL (ref 1.4–7.0)
Neutrophils: 59 %
Platelets: 270 10*3/uL (ref 150–450)
RBC: 4.85 x10E6/uL (ref 4.14–5.80)
RDW: 12.5 % (ref 11.6–15.4)
WBC: 5.1 10*3/uL (ref 3.4–10.8)

## 2020-11-17 LAB — VITAMIN D 25 HYDROXY (VIT D DEFICIENCY, FRACTURES): Vit D, 25-Hydroxy: 31.9 ng/mL (ref 30.0–100.0)

## 2020-11-24 ENCOUNTER — Encounter: Payer: Self-pay | Admitting: Family

## 2020-11-24 ENCOUNTER — Ambulatory Visit (INDEPENDENT_AMBULATORY_CARE_PROVIDER_SITE_OTHER): Payer: Medicare Other | Admitting: Family

## 2020-11-24 ENCOUNTER — Ambulatory Visit (INDEPENDENT_AMBULATORY_CARE_PROVIDER_SITE_OTHER): Payer: Medicare Other

## 2020-11-24 ENCOUNTER — Other Ambulatory Visit: Payer: Self-pay | Admitting: Family

## 2020-11-24 ENCOUNTER — Other Ambulatory Visit: Payer: Self-pay

## 2020-11-24 VITALS — BP 150/79 | HR 88 | Temp 97.7°F | Ht 71.0 in | Wt 133.6 lb

## 2020-11-24 DIAGNOSIS — R109 Unspecified abdominal pain: Secondary | ICD-10-CM | POA: Diagnosis not present

## 2020-11-24 DIAGNOSIS — K649 Unspecified hemorrhoids: Secondary | ICD-10-CM | POA: Diagnosis not present

## 2020-11-24 DIAGNOSIS — K921 Melena: Secondary | ICD-10-CM | POA: Diagnosis not present

## 2020-11-24 DIAGNOSIS — K5900 Constipation, unspecified: Secondary | ICD-10-CM | POA: Diagnosis not present

## 2020-11-24 DIAGNOSIS — K625 Hemorrhage of anus and rectum: Secondary | ICD-10-CM

## 2020-11-24 LAB — HEMOGLOBIN, FINGERSTICK: Hemoglobin: 17.5 g/dL (ref 12.6–17.7)

## 2020-11-24 NOTE — Progress Notes (Signed)
Subjective:    Patient ID: TAVARRIS MCPARTLIN, male    DOB: 1945-06-03, 75 y.o.   MRN: [id number]  Chief Complaint  Patient presents with   Hemorrhoids    HPI Pt presents to the office complaining of hemorrhoids. He was seen on 11/15/20 and given hydrocortisone suppositories. He reports he has used them three times. It has mildly helped with the pain. However, he has seen bright red blood after his BM's. . He reports it is tablespoon of blood. He is also having a lot gas. Denies any stomach pains or fever. Denies any diarrhea or constipation. Denies any pain, just "gas pressure".    Review of Systems  Gastrointestinal:  Positive for anal bleeding and blood in stool. Negative for abdominal pain, constipation, diarrhea, nausea, rectal pain and vomiting.  All other systems reviewed and are negative.     Objective:   Physical Exam Vitals reviewed.  Constitutional:      General: He is not in acute distress.    Appearance: He is well-developed.     Comments: underweight  HENT:     Head: Normocephalic.     Right Ear: Tympanic membrane normal.     Left Ear: Tympanic membrane normal.  Eyes:     General:        Right eye: No discharge.        Left eye: No discharge.     Pupils: Pupils are equal, round, and reactive to light.  Neck:     Thyroid: No thyromegaly.  Cardiovascular:     Rate and Rhythm: Normal rate and regular rhythm.     Heart sounds: Normal heart sounds. No murmur heard. Pulmonary:     Effort: Pulmonary effort is normal. No respiratory distress.     Breath sounds: Normal breath sounds. No wheezing.  Abdominal:     General: Bowel sounds are normal. There is no distension.     Palpations: Abdomen is soft.     Tenderness: There is no abdominal tenderness.  Musculoskeletal:        General: No tenderness. Normal range of motion.     Cervical back: Normal range of motion and neck supple.  Skin:    General: Skin is warm and dry.     Coloration: Skin is pale.     Findings:  No erythema or rash.  Neurological:     Mental Status: He is alert and oriented to person, place, and time.     Cranial Nerves: No cranial nerve deficit.     Deep Tendon Reflexes: Reflexes are normal and symmetric.  Psychiatric:        Behavior: Behavior normal.        Thought Content: Thought content normal.        Judgment: Judgment normal.     BP (!) 150/79   Pulse 88   Temp 97.7 F (36.5 C) (Temporal)   Ht '5\' 11"'$  (1.803 m)   Wt 133 lb 9.6 oz (60.6 kg)   SpO2 98%   BMI 18.63 kg/m       Assessment & Plan:  JAMESDAVID STIFEL comes in today with chief complaint of Hemorrhoids   Diagnosis and orders addressed:  1. Hemorrhoids, unspecified hemorrhoid type  2. Blood in stool - Hemoglobin, fingerstick - DG Abd 1 View  3. Abdominal pressure - DG Abd 1 View   Labs pending On exam no hemorrhoids noted. Worrisome for a bleed. Hgb checked today. May need CT scan.    Evelina Dun,  FNP    

## 2020-11-28 NOTE — Telephone Encounter (Signed)
Recommend scheduling with his GI provider ASAP and seeking reevaluation here with [facility] for hgb recheck

## 2020-11-30 ENCOUNTER — Other Ambulatory Visit: Payer: Self-pay

## 2020-11-30 ENCOUNTER — Other Ambulatory Visit: Payer: Medicare Other

## 2020-11-30 ENCOUNTER — Telehealth: Payer: Self-pay | Admitting: Family

## 2020-11-30 ENCOUNTER — Other Ambulatory Visit: Payer: Self-pay | Admitting: *Deleted

## 2020-11-30 DIAGNOSIS — K625 Hemorrhage of anus and rectum: Secondary | ICD-10-CM | POA: Diagnosis not present

## 2020-11-30 LAB — HEMOGLOBIN, FINGERSTICK: Hemoglobin: 15.1 g/dL (ref 12.6–17.7)

## 2020-11-30 NOTE — Telephone Encounter (Signed)
REFERRAL REQUEST Telephone Note  Have you been seen at our office for this problem? yes (Advise that they may need an appointment with their PCP before a referral can be done)  Reason for Referral: gi issues Referral discussed with patient: yes Best contact number of patient for referral team:   [phone] Has patient been seen by a specialist for this issue before: yes Patient provider preference for referral:  Patient location preference for referral: [facility]   Patient notified that referrals can take up to a week or longer to process. If they haven't heard anything within a week they should call back and speak with the referral department.  Yes  Lenna Gilford' pt.  He wants to get this done by next Tuesday.  Please call pt.

## 2020-12-03 ENCOUNTER — Other Ambulatory Visit: Payer: Self-pay

## 2020-12-03 ENCOUNTER — Encounter (HOSPITAL_COMMUNITY): Payer: Self-pay | Admitting: Emergency Medicine

## 2020-12-03 ENCOUNTER — Emergency Department (HOSPITAL_COMMUNITY): Payer: Medicare Other

## 2020-12-03 ENCOUNTER — Emergency Department (HOSPITAL_COMMUNITY)
Admission: EM | Admit: 2020-12-03 | Discharge: 2020-12-03 | Disposition: A | Discharge status: Home / Self Care | Payer: Medicare Other | Attending: Emergency Medicine | Admitting: Emergency Medicine

## 2020-12-03 DIAGNOSIS — Z8572 Personal history of non-Hodgkin lymphomas: Secondary | ICD-10-CM | POA: Diagnosis not present

## 2020-12-03 DIAGNOSIS — R109 Unspecified abdominal pain: Secondary | ICD-10-CM | POA: Diagnosis not present

## 2020-12-03 DIAGNOSIS — Z87891 Personal history of nicotine dependence: Secondary | ICD-10-CM | POA: Diagnosis not present

## 2020-12-03 DIAGNOSIS — R9431 Abnormal electrocardiogram [ECG] [EKG]: Secondary | ICD-10-CM | POA: Diagnosis not present

## 2020-12-03 DIAGNOSIS — R197 Diarrhea, unspecified: Secondary | ICD-10-CM | POA: Insufficient documentation

## 2020-12-03 DIAGNOSIS — Z7982 Long term (current) use of aspirin: Secondary | ICD-10-CM | POA: Insufficient documentation

## 2020-12-03 DIAGNOSIS — K625 Hemorrhage of anus and rectum: Secondary | ICD-10-CM | POA: Diagnosis not present

## 2020-12-03 DIAGNOSIS — Z8551 Personal history of malignant neoplasm of bladder: Secondary | ICD-10-CM | POA: Insufficient documentation

## 2020-12-03 DIAGNOSIS — R143 Flatulence: Secondary | ICD-10-CM | POA: Insufficient documentation

## 2020-12-03 DIAGNOSIS — Z7951 Long term (current) use of inhaled steroids: Secondary | ICD-10-CM | POA: Diagnosis not present

## 2020-12-03 DIAGNOSIS — Z85038 Personal history of other malignant neoplasm of large intestine: Secondary | ICD-10-CM | POA: Insufficient documentation

## 2020-12-03 DIAGNOSIS — J449 Chronic obstructive pulmonary disease, unspecified: Secondary | ICD-10-CM | POA: Insufficient documentation

## 2020-12-03 DIAGNOSIS — R1030 Lower abdominal pain, unspecified: Secondary | ICD-10-CM | POA: Diagnosis not present

## 2020-12-03 LAB — COMPREHENSIVE METABOLIC PANEL
ALT: 12 U/L (ref 0–44)
AST: 17 U/L (ref 15–41)
Albumin: 4.7 g/dL (ref 3.5–5.0)
Alkaline Phosphatase: 58 U/L (ref 38–126)
Anion gap: 6 (ref 5–15)
BUN: 9 mg/dL (ref 8–23)
CO2: 26 mmol/L (ref 22–32)
Calcium: 8.8 mg/dL — ABNORMAL LOW (ref 8.9–10.3)
Chloride: 102 mmol/L (ref 98–111)
Creatinine, Ser: 0.86 mg/dL (ref 0.61–1.24)
GFR, Estimated: >60 mL/min (ref >60)
Glucose, Bld: 118 mg/dL — ABNORMAL HIGH (ref 70–99)
Potassium: 4.2 mmol/L (ref 3.5–5.1)
Sodium: 134 mmol/L — ABNORMAL LOW (ref 135–145)
Total Bilirubin: 1.2 mg/dL (ref 0.3–1.2)
Total Protein: 7.8 g/dL (ref 6.5–8.1)

## 2020-12-03 LAB — CBC WITH DIFFERENTIAL/PLATELET
Abs Immature Granulocytes: 0 10*3/uL (ref 0.00–0.07)
Basophils Absolute: 0 10*3/uL (ref 0.0–0.1)
Basophils Relative: 1 %
Eosinophils Absolute: 0 10*3/uL (ref 0.0–0.5)
Eosinophils Relative: 1 %
HCT: 43.2 % (ref 39.0–52.0)
Hemoglobin: 15.2 g/dL (ref 13.0–17.0)
Immature Granulocytes: 0 %
Lymphocytes Relative: 20 %
Lymphs Abs: 0.7 10*3/uL (ref 0.7–4.0)
MCH: 33.2 pg (ref 26.0–34.0)
MCHC: 35.2 g/dL (ref 30.0–36.0)
MCV: 94.3 fL (ref 80.0–100.0)
Monocytes Absolute: 0.3 10*3/uL (ref 0.1–1.0)
Monocytes Relative: 7 %
Neutro Abs: 2.7 10*3/uL (ref 1.7–7.7)
Neutrophils Relative %: 71 %
Platelets: 233 10*3/uL (ref 150–400)
RBC: 4.58 MIL/uL (ref 4.22–5.81)
RDW: 12.7 % (ref 11.5–15.5)
WBC: 3.8 10*3/uL — ABNORMAL LOW (ref 4.0–10.5)
nRBC: 0 % (ref 0.0–0.2)

## 2020-12-03 LAB — POC OCCULT BLOOD, ED

## 2020-12-03 LAB — TYPE AND SCREEN
ABO/RH(D): O POS
Antibody Screen: NEGATIVE

## 2020-12-03 MED ORDER — IOHEXOL 350 MG/ML SOLN
85.0000 mL | Freq: Once | INTRAVENOUS | Status: AC | PRN
  Administered 2020-12-03: 85 mL via INTRAVENOUS

## 2020-12-03 MED ORDER — ONDANSETRON 4 MG PO TBDP: 4.0000 mg | ORAL_TABLET | Freq: Three times a day (TID) | ORAL | 0 refills | Status: DC | PRN

## 2020-12-03 NOTE — ED Triage Notes (Signed)
Patient also reports colon and bladder cancer in 2010 in which he did have a colon resection. Per patient "cancer free" at this time.

## 2020-12-03 NOTE — ED Provider Notes (Signed)
[facility] EMERGENCY DEPARTMENT Provider Note   CSN: [id number] Arrival date & time: 12/03/20  [id number]     History Chief Complaint  Patient presents with   Rectal Bleeding    Kenneth Reed is a 75 y.o. male.  HPI  Patient is a 75 year old male with a history of bladder cancer, colon cancer, COPD, non-Hodgkin's lymphoma, hyperlipidemia, who presents to the emergency department due to rectal bleeding.  Patient states about 3 weeks ago he began experiencing diarrhea.  This resolved and afterwards patient began experiencing intermittent rectal bleeding.  Denies any constipation or straining with BMs.  States his bleeding is occurring about every other day.  He will typically experience about 3 to 4 teaspoons of bright red blood on the toilet when this occurs.  Reports some intermittent lower abdominal pain as well with increased flatulence.  He followed up with his PCP and had an abdominal x-ray as well as a hemoglobin level which he states were normal.  He has a follow-up appointment with his gastroenterologist on September 16.  Patient reports some mild intermittent shortness of breath and lightheadedness but denies any syncope or chest pain.  Per records, patient's most recent colonoscopy was on September 08, 2013.  Findings as noted below:  Normal-appearing cecum and ileocecal valve were identified.  The ascending, transverse, descending, sigmoid colon, and rectum appeared unremarkable.     Past Medical History:  Diagnosis Date   Bladder cancer [facility]) 2010   Dr. [place] Pink operated; no need for chemo; recurred locally  in 2013 locally.     BPH (benign prostatic hyperplasia)    Cancer ([place]) 03/26/12   High Grade NonHodgkin's B- Cell Lymphoma   Colon cancer (HCC) 2010   Dr. Anthony Sar operated; stage II.  No need for chemo.   COPD (chronic obstructive pulmonary disease) ([place])    Fatigue    History of colon cancer 01/07/2013   HOH (hard of hearing)    Kidney stone    Non-Hodgkin's lymphoma of  head ([place]) 2014   Status post chemotherapy 04/18/2012 - 06/20/12    4 Cycles of R-CHOP with 3 Doses of Prophylactic Intrathecal chemo.   Weight loss 07/11/12 note    Patient Active Problem List   Diagnosis Date Noted   Hyperlipemia 02/16/2020   Aortic atherosclerosis ([place]) 08/14/2017   Prostate enlargement 08/14/2017   Other fatigue 07/25/2017   Polyp, colonic 11/29/2016   Cyst of left kidney 07/31/2016   Multiple lung nodules on CT 08/03/2015   Chronic nausea 07/26/2015   BPH (benign prostatic hyperplasia) 04/21/2015   COPD (chronic obstructive pulmonary disease) ([place]) 04/21/2015   Vitamin D deficiency 04/21/2015   History of bladder cancer 08/10/2014   Hearing loss of both ears 02/09/2014   Personal history of colon cancer 01/07/2013   Non-Hodgkin's lymphoma of tonsil ([place]) 07/15/2012    Past Surgical History:  Procedure Laterality Date   Biospy Left Tonsil  03/26/12   Non - Hodgkins B-Cell Lympyhoma   BLADDER SURGERY  2010   CATARACT EXTRACTION W/PHACO Right 03/30/2020   Procedure: CATARACT EXTRACTION PHACO AND INTRAOCULAR LENS PLACEMENT ([place]);  Surgeon: Baruch Goldmann, MD;  Location: [facility];  Service: Ophthalmology;  Laterality: Right;  CDE: 15.63   CATARACT EXTRACTION W/PHACO Left 04/15/2020   Procedure: CATARACT EXTRACTION PHACO AND INTRAOCULAR LENS PLACEMENT LEFT EYE;  Surgeon: Baruch Goldmann, MD;  Location: [facility];  Service: Ophthalmology;  Laterality: Left;  CDE 10.20   COLECTOMY  2010   COLONOSCOPY  2012   next  one 2014.    CYSTOSCOPY W/ RETROGRADES Bilateral 09/09/2019   Procedure: CYSTOSCOPY WITH RETROGRADE PYELOGRAM;  Surgeon: Cleon Gustin, MD;  Location: [facility];  Service: Urology;  Laterality: Bilateral;   HAND TENDON SURGERY Bilateral    NASAL POLYP SURGERY  1983   TRANSURETHRAL RESECTION OF BLADDER TUMOR N/A 09/09/2019   Procedure: TRANSURETHRAL RESECTION OF BLADDER TUMOR (TURBT);  Surgeon: Cleon Gustin, MD;  Location: [facility];  Service: Urology;   Laterality: N/A;       Family History  Problem Relation Age of Onset   Cancer Father        leukemia   Cancer Maternal Aunt        leukemia   Cancer Paternal Grandmother        leukemia   Narcolepsy Daughter     Social History   Tobacco Use   Smoking status: Never   Smokeless tobacco: Former    Types: Chew    Quit date: 2010  Vaping Use   Vaping Use: Never used  Substance Use Topics   Alcohol use: Yes    Alcohol/week: 2.0 standard drinks    Types: 2 Cans of beer per week    Comment: few beers here and there, nothing regular   Drug use: No    Home Medications Prior to Admission medications   Medication Sig Start Date End Date Taking? Authorizing Provider  ondansetron (ZOFRAN ODT) 4 MG disintegrating tablet Take 1 tablet (4 mg total) by mouth every 8 (eight) hours as needed for nausea or vomiting. 12/03/20  Yes Rayna Sexton, PA-C  acetaminophen (TYLENOL) 500 MG tablet Take 1,000 mg by mouth every 6 (six) hours as needed for moderate pain.     [provider]  albuterol (VENTOLIN HFA) 108 (90 Base) MCG/ACT inhaler Inhale 2 puffs into the lungs every 4 (four) hours as needed. 11/15/20   Evelina Dun A, FNP  aspirin EC 81 MG tablet Take 1 tablet (81 mg total) by mouth daily. Swallow whole. 05/18/20   Sharion Balloon, FNP  fluticasone (FLONASE) 50 MCG/ACT nasal spray Place 1 spray into both nostrils 2 (two) times daily as needed for allergies or rhinitis. 01/06/16   Dettinger, Fransisca Kaufmann, MD  Fluticasone-Salmeterol (ADVAIR) 250-50 MCG/DOSE AEPB Inhale 1 puff into the lungs 2 (two) times daily.    [provider]  hydrocortisone (ANUSOL-HC) 25 MG suppository Place 1 suppository (25 mg total) rectally 2 (two) times daily. 11/15/20   Sharion Balloon, FNP  rosuvastatin (CRESTOR) 10 MG tablet Take 1 tablet (10 mg total) by mouth daily. 05/18/20   Sharion Balloon, FNP  tamsulosin (FLOMAX) 0.4 MG CAPS capsule Take 1 capsule (0.4 mg total) by mouth in the morning and at  bedtime. 12/21/19   McKenzie, Candee Furbish, MD  Tiotropium Bromide Monohydrate (SPIRIVA RESPIMAT) 2.5 MCG/ACT AERS Inhale 1 puff into the lungs daily. Patient not taking: Reported on 11/24/2020 10/04/20   Claretta Fraise, MD    Allergies    Patient has no known allergies.  Review of Systems   Review of Systems  All other systems reviewed and are negative. Ten systems reviewed and are negative for acute change, except as noted in the HPI.   Physical Exam Updated Vital Signs BP 129/72   Pulse 70   Temp 97.9 F (36.6 C) (Oral)   Resp (!) 23   Ht '5\' 11"'$  (1.803 m)   Wt 61.2 kg   SpO2 100%   BMI 18.83 kg/m   Physical  Exam Vitals and nursing note reviewed.  Constitutional:      General: He is not in acute distress.    Appearance: Normal appearance. He is not ill-appearing, toxic-appearing or diaphoretic.  HENT:     Head: Normocephalic and atraumatic.     Right Ear: External ear normal.     Left Ear: External ear normal.     Nose: Nose normal.     Mouth/Throat:     Mouth: Mucous membranes are moist.     Pharynx: Oropharynx is clear. No oropharyngeal exudate or posterior oropharyngeal erythema.  Eyes:     Extraocular Movements: Extraocular movements intact.  Cardiovascular:     Rate and Rhythm: Normal rate and regular rhythm.     Pulses: Normal pulses.     Heart sounds: Normal heart sounds. No murmur heard.   No friction rub. No gallop.     Comments: RRR without M/R/G. Pulmonary:     Effort: Pulmonary effort is normal. No respiratory distress.     Breath sounds: Normal breath sounds. No stridor. No wheezing, rhonchi or rales.  Abdominal:     General: Abdomen is flat.     Palpations: Abdomen is soft.     Tenderness: There is no abdominal tenderness.     Comments: Abdomen is flat, soft, and nontender.  Genitourinary:    Comments: Nursing chaperone present.  Normal-appearing anal region.  No visible or palpable hemorrhoids noted.  Small amount of brown stool noted in the rectal  vault.  No gross hematochezia.  No melena.  No tenderness appreciated throughout the exam. Musculoskeletal:        General: Normal range of motion.     Cervical back: Normal range of motion and neck supple. No tenderness.     Right lower leg: No edema.     Left lower leg: No edema.  Skin:    General: Skin is warm and dry.  Neurological:     General: No focal deficit present.     Mental Status: He is alert and oriented to person, place, and time.  Psychiatric:        Mood and Affect: Mood normal.        Behavior: Behavior normal.   ED Results / Procedures / Treatments   Labs (all labs ordered are listed, but only abnormal results are displayed) Labs Reviewed  COMPREHENSIVE METABOLIC PANEL - Abnormal; Notable for the following components:      Result Value   Sodium 134 (*)    Glucose, Bld 118 (*)    Calcium 8.8 (*)    All other components within normal limits  CBC WITH DIFFERENTIAL/PLATELET - Abnormal; Notable for the following components:   WBC 3.8 (*)    All other components within normal limits  POC OCCULT BLOOD, ED  TYPE AND SCREEN   EKG EKG Interpretation  Date/Time:  Saturday December 03 2020 09:54:40 EDT Ventricular Rate:  74 PR Interval:  156 QRS Duration: 100 QT Interval:  410 QTC Calculation: 455 R Axis:   -6 Text Interpretation: Sinus rhythm RSR' in V1 or V2, probably normal variant Confirmed by Nanda Quinton [phone]) on 12/03/2020 10:00:02 AM  Radiology CT ABDOMEN PELVIS W CONTRAST  Result Date: 12/03/2020 CLINICAL DATA:  Abdominal pain, rectal bleeding EXAM: CT ABDOMEN AND PELVIS WITH CONTRAST TECHNIQUE: Multidetector CT imaging of the abdomen and pelvis was performed using the standard protocol following bolus administration of intravenous contrast. CONTRAST:  45m OMNIPAQUE IOHEXOL 350 MG/ML SOLN COMPARISON:  08/03/2015 FINDINGS: Lower chest: No  pleural or pericardial effusion. Visualized lung bases clear. Hepatobiliary: No focal liver abnormality is seen. No  gallstones, gallbladder wall thickening, or biliary dilatation. Pancreas: Unremarkable. No pancreatic ductal dilatation or surrounding inflammatory changes. Spleen: Normal in size.  Scattered calcified granulomas. Adrenals/Urinary Tract: Adrenal glands unremarkable. Stable 1.1 cm exophytic cyst from the upper pole left kidney. No urolithiasis or hydronephrosis. Urinary bladder physiologically distended. Stomach/Bowel: Stomach is decompressed. The small bowel is nondistended. Appendix not identified. Anastomotic staple line in the distal sigmoid colon. Vascular/Lymphatic: Mild calcified aortic plaque without aneurysm. No abdominal or pelvic adenopathy. Portal vein patent. Reproductive: Prostate enlargement with central coarse calcifications. Other: No ascites.  No free air. Musculoskeletal: Spondylitic changes in the lower lumbar spine. Bilateral pars defects L5 allowing grade 1 anterolisthesis L5-S1 as before. IMPRESSION: 1. No acute findings. 2. Degenerative and postop changes as above. Effusion.  Visualized lung bases clear. Electronically Signed   By: Lucrezia Europe M.D.   On: 12/03/2020 11:49    Procedures Procedures   Medications Ordered in ED Medications  iohexol (OMNIPAQUE) 350 MG/ML injection 85 mL (85 mLs Intravenous Contrast Given 12/03/20 1129)    ED Course  I have reviewed the triage vital signs and the nursing notes.  Pertinent labs & imaging results that were available during my care of the patient were reviewed by me and considered in my medical decision making (see chart for details).  Clinical Course as of 12/03/20 1219  Sat Dec 03, 2020  1019 POC occult blood, ED Hemoccult negative [LJ]    Clinical Course User Index [LJ] Rayna Sexton, PA-C   MDM Rules/Calculators/A&P                          Pt is a 75 y.o. male who presents to the emergency department due to rectal bleeding.  Labs: CBC with a white blood cell count of 3.8. CMP with a sodium of 134, glucose of 118,  calcium of 8.8. Hemoccult is negative.  Imaging: CT scan of the abdomen/pelvis with contrast shows no acute findings.  Degenerative and postop changes as above.  I, Rayna Sexton, PA-C, personally reviewed and evaluated these images and lab results as part of my medical decision-making.  Unsure the source of the patient's symptoms.  Hemoccult is negative.  Hemoglobin is within normal limits.  Abdomen is soft and nontender on my exam.  CT scan of the abdomen/pelvis is reassuring.  Feel that the patient is stable for discharge at this time and he is agreeable.  Patient requested some Zofran for his intermittent nausea.  We will provide this.  He has a follow-up with gastroenterology in 2 weeks.  I urged patient to make sure that he keeps this appointment.  We discussed return precautions in length.  His questions were answered and he was amicable at the time of discharge.  Note: Portions of this report may have been transcribed using voice recognition software. Every effort was made to ensure accuracy; however, inadvertent computerized transcription errors may be present.   Final Clinical Impression(s) / ED Diagnoses Final diagnoses:  Rectal bleeding   Rx / DC Orders ED Discharge Orders          Ordered    ondansetron (ZOFRAN ODT) 4 MG disintegrating tablet  Every 8 hours PRN        12/03/20 1219             Rayna Sexton, PA-C 12/03/20 1223    Long, Wonda Old[facility],  MD 12/04/20 [id number]

## 2020-12-03 NOTE — Discharge Instructions (Addendum)
Please follow-up with your gastroenterologist at your appointment in 2 weeks.  If you develop any new or worsening symptoms please come back to the emergency department immediately.  It was a pleasure to meet you.

## 2020-12-03 NOTE — ED Triage Notes (Signed)
Patient c/o rectal bleeding with BMs x3 weeks. Per patient originally started with diarrhea but he no longer has diarrhea and is now having normal BM. Denies straining with BMs. Denies any vomiting. Per patient some lower abd pain and tenderness with flatulence. Patient reports having x-ray and hgb checked by PCP in which both were normal. Patient also has GI appointment scheduled for Sep 16.

## 2020-12-22 ENCOUNTER — Encounter: Payer: Self-pay | Admitting: Internal Medicine

## 2020-12-22 ENCOUNTER — Ambulatory Visit (INDEPENDENT_AMBULATORY_CARE_PROVIDER_SITE_OTHER): Payer: Medicare Other | Admitting: Internal Medicine

## 2020-12-22 ENCOUNTER — Other Ambulatory Visit: Payer: Self-pay

## 2020-12-22 VITALS — BP 139/83 | HR 95 | Temp 97.7°F | Ht 71.0 in | Wt 133.0 lb

## 2020-12-22 DIAGNOSIS — Z85038 Personal history of other malignant neoplasm of large intestine: Secondary | ICD-10-CM

## 2020-12-22 DIAGNOSIS — K625 Hemorrhage of anus and rectum: Secondary | ICD-10-CM

## 2020-12-22 NOTE — Progress Notes (Signed)
Primary Care Physician:  Sharion Balloon, FNP Primary Gastroenterologist:  Dr. Abbey Chatters  Chief Complaint  Patient presents with   Rectal Bleeding    Going on over 1 month, every time he has a BM (QOD). Last TCS 2015    HPI:   Kenneth Reed is a 75 y.o. male who presents to the clinic today as a new patient for for ER follow-up visit.  He has a history of colon cancer of the sigmoid colon status post sigmoidectomy greater than 10 years ago.  Last colonoscopy 2015 unremarkable with recommended 5-year recall.  He states the bleeding started after an episode of diarrhea approximately 6 weeks ago.  Notes bright red blood with bowel movements both on the tissue paper and toilet bowl.  No rectal pain or discomfort.  Was trialed on Anusol suppositories which did not help.  No abdominal pain.  Diarrhea has resolved.  Past Medical History:  Diagnosis Date   Bladder cancer [facility]) 2010   Dr. [place] Pink operated; no need for chemo; recurred locally  in 2013 locally.     BPH (benign prostatic hyperplasia)    Cancer ([place]) 03/26/12   High Grade NonHodgkin's B- Cell Lymphoma   Colon cancer ([facility]CC) 2010   Dr. Anthony Sar operated; stage II.  No need for chemo.   COPD (chronic obstructive pulmonary disease) ([place])    Fatigue    History of colon cancer 01/07/2013   HOH (hard of hearing)    Kidney stone    Non-Hodgkin's lymphoma of head ([place]) 2014   Status post chemotherapy 04/18/2012 - 06/20/12    4 Cycles of R-CHOP with 3 Doses of Prophylactic Intrathecal chemo.   Weight loss 07/11/12 note    Past Surgical History:  Procedure Laterality Date   Biospy Left Tonsil  03/26/12   Non - Hodgkins B-Cell Lympyhoma   BLADDER SURGERY  2010   CATARACT EXTRACTION W/PHACO Right 03/30/2020   Procedure: CATARACT EXTRACTION PHACO AND INTRAOCULAR LENS PLACEMENT ([place]);  Surgeon: Baruch Goldmann, MD;  Location: [facility];  Service: Ophthalmology;  Laterality: Right;  CDE: 15.63   CATARACT EXTRACTION W/PHACO Left 04/15/2020    Procedure: CATARACT EXTRACTION PHACO AND INTRAOCULAR LENS PLACEMENT LEFT EYE;  Surgeon: Baruch Goldmann, MD;  Location: [facility];  Service: Ophthalmology;  Laterality: Left;  CDE 10.20   COLECTOMY  2010   COLONOSCOPY  2012   next one 2014.    CYSTOSCOPY W/ RETROGRADES Bilateral 09/09/2019   Procedure: CYSTOSCOPY WITH RETROGRADE PYELOGRAM;  Surgeon: Cleon Gustin, MD;  Location: [facility];  Service: Urology;  Laterality: Bilateral;   HAND TENDON SURGERY Bilateral    NASAL POLYP SURGERY  1983   TRANSURETHRAL RESECTION OF BLADDER TUMOR N/A 09/09/2019   Procedure: TRANSURETHRAL RESECTION OF BLADDER TUMOR (TURBT);  Surgeon: Cleon Gustin, MD;  Location: [facility];  Service: Urology;  Laterality: N/A;    Current Outpatient Medications  Medication Sig Dispense Refill   acetaminophen (TYLENOL) 500 MG tablet Take 1,000 mg by mouth every 6 (six) hours as needed for moderate pain.      albuterol (VENTOLIN HFA) 108 (90 Base) MCG/ACT inhaler Inhale 2 puffs into the lungs every 4 (four) hours as needed. 1 each 2   aspirin EC 81 MG tablet Take 1 tablet (81 mg total) by mouth daily. Swallow whole. 90 tablet 4   fluticasone (FLONASE) 50 MCG/ACT nasal spray Place 1 spray into both nostrils 2 (two) times daily as needed for allergies or rhinitis. 16 g 6  Fluticasone-Salmeterol (ADVAIR) 250-50 MCG/DOSE AEPB Inhale 1 puff into the lungs 2 (two) times daily.     rosuvastatin (CRESTOR) 10 MG tablet Take 1 tablet (10 mg total) by mouth daily. 90 tablet 3   tamsulosin (FLOMAX) 0.4 MG CAPS capsule Take 1 capsule (0.4 mg total) by mouth in the morning and at bedtime. 60 capsule 11   No current facility-administered medications for this visit.    Allergies as of 12/22/2020   (No Known Allergies)    Family History  Problem Relation Age of Onset   Cancer Father        leukemia   Cancer Maternal Aunt        leukemia   Cancer Paternal Grandmother        leukemia   Narcolepsy Daughter     Social History    Socioeconomic History   Marital status: Single    Spouse name: Not on file   Number of children: 2   Years of education: GED   Highest education level: GED or equivalent  Occupational History   Occupation: Retired    Fish farm manager: [employer]    Comment: textile   Tobacco Use   Smoking status: Never   Smokeless tobacco: Former    Types: Chew    Quit date: 2010  Vaping Use   Vaping Use: Never used  Substance and Sexual Activity   Alcohol use: Yes    Alcohol/week: 2.0 standard drinks    Types: 2 Cans of beer per week    Comment: few beers here and there, nothing regular   Drug use: No   Sexual activity: Not Currently  Other Topics Concern   Not on file  Social History Narrative   Not on file   Social Determinants of Health   Financial Resource Strain: Not on file  Food Insecurity: Not on file  Transportation Needs: Not on file  Physical Activity: Not on file  Stress: Not on file  Social Connections: Not on file  Intimate Partner Violence: Not on file    Subjective: Review of Systems  Constitutional:  Negative for chills and fever.  HENT:  Negative for congestion and hearing loss.   Eyes:  Negative for blurred vision and double vision.  Respiratory:  Negative for cough and shortness of breath.   Cardiovascular:  Negative for chest pain and palpitations.  Gastrointestinal:  Negative for abdominal pain, blood in stool, constipation, diarrhea, heartburn, melena and vomiting.  Genitourinary:  Negative for dysuria and urgency.  Musculoskeletal:  Negative for joint pain and myalgias.  Skin:  Negative for itching and rash.  Neurological:  Negative for dizziness and headaches.  Psychiatric/Behavioral:  Negative for depression. The patient is not nervous/anxious.       Objective: BP 139/83   Pulse 95   Temp 97.7 F (36.5 C)   Ht '5\' 11"'$  (1.803 m)   Wt 133 lb (60.3 kg)   BMI 18.55 kg/m  Physical Exam Constitutional:      Appearance: Normal appearance.  HENT:      Head: Normocephalic and atraumatic.  Eyes:     Extraocular Movements: Extraocular movements intact.     Conjunctiva/sclera: Conjunctivae normal.  Cardiovascular:     Rate and Rhythm: Normal rate and regular rhythm.  Pulmonary:     Effort: Pulmonary effort is normal.     Breath sounds: Normal breath sounds.  Abdominal:     General: Bowel sounds are normal.     Palpations: Abdomen is soft.  Musculoskeletal:  General: Normal range of motion.     Cervical back: Normal range of motion and neck supple.  Skin:    General: Skin is warm.  Neurological:     General: No focal deficit present.     Mental Status: He is alert and oriented to person, place, and time.  Psychiatric:        Mood and Affect: Mood normal.        Behavior: Behavior normal.     Assessment: *Rectal bleeding *History of colon cancer status post sigmoidectomy  Plan: Etiology of patient's bleeding likely hemorrhoidal.  However given his history of colon cancer, I would recommend that we pursue colonoscopy to further evaluate.  He states he will discuss with his daughter and call to schedule, ASA 3.  In the meantime, I recommended that he increase fiber in his diet or take over-the-counter Benefiber/Metamucil.  I have also recommended that he drink at least 4 to 6 glasses of water daily.  If he has symptoms of constipation or straining with bowel movements, I recommended that he add MiraLAX 1 capful daily.  We can consider hemorrhoid banding in the future pending colonoscopy results.  Further recommendations to follow.  12/22/2020 3:51 PM   Disclaimer: This note was dictated with voice recognition software. Similar sounding words can inadvertently be transcribed and may not be corrected upon review.

## 2020-12-22 NOTE — Patient Instructions (Signed)
The likely cause of your bleeding is internal hemorrhoids.  However given your history of colon cancer, I would recommend that we pursue colonoscopy.  If you would like to have this done, then please call our office and we will get it scheduled.  We can consider hemorrhoid banding to treat this, though you will need to have colonoscopy prior to rule out other causes.  In the meantime, I would recommend adding over-the-counter Benefiber or Metamucil.  Be sure to drink at least 4 to 6 glasses of water daily.  If you have issues with constipation or straining with bowel movements, I would recommend adding 1 capful MiraLAX daily as well.  It was nice meeting you today.  Dr. Abbey Chatters  At [facility] we value your feedback. You may receive a survey about your visit today. Please share your experience as we strive to create trusting relationships with our patients to provide genuine, compassionate, quality care.  We appreciate your understanding and patience as we review any laboratory studies, imaging, and other diagnostic tests that are ordered as we care for you. Our office policy is 5 business days for review of these results, and any emergent or urgent results are addressed in a timely manner for your best interest. If you do not hear from our office in 1 week, please contact us.   We also encourage the use of MyChart, which contains your medical information for your review as well. If you are not enrolled in this feature, an access code is on this after visit summary for your convenience. Thank you for allowing [place] to be involved in your care.  It was great to see you today!  I hope you have a great rest of your summer!!    Elon Alas. Abbey Chatters, D.O. Gastroenterology and Hepatology [facility]

## 2020-12-28 ENCOUNTER — Telehealth: Payer: Self-pay | Admitting: Family

## 2020-12-28 ENCOUNTER — Other Ambulatory Visit: Payer: Medicare Other | Admitting: Urology

## 2020-12-28 NOTE — Telephone Encounter (Signed)
Left message for patient to call back and schedule Medicare Annual Wellness Visit (AWV) by video or phone  Last AWV 07/23/2017  Please schedule at any time with [facility] Health Advisor.  45-minute appointment  Any questions, please contact me at [phone]

## 2021-01-23 ENCOUNTER — Other Ambulatory Visit: Payer: Self-pay

## 2021-01-23 ENCOUNTER — Ambulatory Visit (INDEPENDENT_AMBULATORY_CARE_PROVIDER_SITE_OTHER): Payer: Medicare Other

## 2021-01-23 DIAGNOSIS — Z23 Encounter for immunization: Secondary | ICD-10-CM | POA: Diagnosis not present

## 2021-02-04 DIAGNOSIS — R0989 Other specified symptoms and signs involving the circulatory and respiratory systems: Secondary | ICD-10-CM | POA: Diagnosis not present

## 2021-02-04 DIAGNOSIS — J441 Chronic obstructive pulmonary disease with (acute) exacerbation: Secondary | ICD-10-CM | POA: Diagnosis not present

## 2021-02-16 ENCOUNTER — Other Ambulatory Visit: Payer: Self-pay

## 2021-02-16 ENCOUNTER — Ambulatory Visit: Payer: Medicare Other

## 2021-02-16 DIAGNOSIS — Z0130 Encounter for examination of blood pressure without abnormal findings: Secondary | ICD-10-CM

## 2021-02-16 DIAGNOSIS — Z20822 Contact with and (suspected) exposure to covid-19: Secondary | ICD-10-CM | POA: Diagnosis not present

## 2021-02-16 NOTE — Progress Notes (Signed)
Patient came in today because he had been to dental office this morning and they would not do procedure because his blood pressure was elevated.  Patient is unsure of what the reading was.  Blood pressure was checked here and it was 155/82. Patient was scheduled an appointment to see you in next available appointment on 02/20/21 and advised to monitor blood pressure and bring readings to visit.

## 2021-02-17 ENCOUNTER — Ambulatory Visit (INDEPENDENT_AMBULATORY_CARE_PROVIDER_SITE_OTHER): Payer: Medicare Other | Admitting: Urology

## 2021-02-17 ENCOUNTER — Encounter: Payer: Self-pay | Admitting: Urology

## 2021-02-17 VITALS — BP 144/86 | HR 97 | Temp 98.1°F

## 2021-02-17 DIAGNOSIS — N401 Enlarged prostate with lower urinary tract symptoms: Secondary | ICD-10-CM | POA: Diagnosis not present

## 2021-02-17 DIAGNOSIS — N3000 Acute cystitis without hematuria: Secondary | ICD-10-CM | POA: Diagnosis not present

## 2021-02-17 DIAGNOSIS — Z8551 Personal history of malignant neoplasm of bladder: Secondary | ICD-10-CM | POA: Diagnosis not present

## 2021-02-17 LAB — URINALYSIS, ROUTINE W REFLEX MICROSCOPIC
Bilirubin, UA: NEGATIVE
Glucose, UA: NEGATIVE
Ketones, UA: NEGATIVE
Leukocytes,UA: NEGATIVE
Nitrite, UA: NEGATIVE
Protein,UA: NEGATIVE
RBC, UA: NEGATIVE
Specific Gravity, UA: 1.015 (ref 1.005–1.030)
Urobilinogen, Ur: 0.2 mg/dL (ref 0.2–1.0)
pH, UA: 7 (ref 5.0–7.5)

## 2021-02-17 MED ORDER — CIPROFLOXACIN HCL 500 MG PO TABS
500.0000 mg | ORAL_TABLET | Freq: Once | ORAL | Status: AC
  Administered 2021-02-17: 500 mg via ORAL

## 2021-02-17 NOTE — Progress Notes (Signed)
   02/17/21  CC: follow bladder tumor   HPI: Mr Paolini is a 75yo here for followup for PNLMP. No worsening LUTS. NO hematuria or dysuria Blood pressure (!) 144/86, pulse 97, temperature 98.1 F (36.7 C). NED. A&Ox3.   No respiratory distress   Abd soft, NT, ND Normal phallus with bilateral descended testicles  Cystoscopy Procedure Note  Patient identification was confirmed, informed consent was obtained, and patient was prepped using Betadine solution.  Lidocaine jelly was administered per urethral meatus.     Pre-Procedure: - Inspection reveals a normal caliber ureteral meatus.  Procedure: The flexible cystoscope was introduced without difficulty - No urethral strictures/lesions are present. - Enlarged prostate  - Normal bladder neck - Bilateral ureteral orifices identified - Bladder mucosa  reveals no ulcers, tumors, or lesions - No bladder stones - No trabeculation    Post-Procedure: - Patient tolerated the procedure well  Assessment/ Plan:  RTC 1 year for cystoscopy  [name], MD

## 2021-02-17 NOTE — Progress Notes (Signed)
Urological Symptom Review  Patient is experiencing the following symptoms: Frequent urination Get up at night to urinate Stream starts and stops Trouble starting stream   Review of Systems  Gastrointestinal (upper)  : Negative for upper GI symptoms  Gastrointestinal (lower) : Negative for lower GI symptoms  Constitutional : Negative for symptoms  Skin: Negative for skin symptoms  Eyes: Negative for eye symptoms  Ear/Nose/Throat : Negative for Ear/Nose/Throat symptoms  Hematologic/Lymphatic: Negative for Hematologic/Lymphatic symptoms  Cardiovascular : Negative for cardiovascular symptoms  Respiratory : Negative for respiratory symptoms  Endocrine: Negative for endocrine symptoms  Musculoskeletal: Negative for musculoskeletal symptoms  Neurological: Negative for neurological symptoms  Psychologic: Negative for psychiatric symptoms

## 2021-02-17 NOTE — Patient Instructions (Signed)
Bladder Cancer Bladder cancer is a condition in which abnormal tissue (a tumor) grows in the bladder. The bladder is the organ that holds urine. Two tubes (ureters) carry the urine from the kidneys to the bladder. The bladder wall is made of layers of tissue. Cancer that spreads through these layers of the bladder wall becomes more difficult to treat. What are the causes? The cause of this condition is not known. What increases the risk? The following factors may make you more likely to develop this condition: Smoking. Working where there are risks (occupational exposures), such as working with rubber, leather, clothing fabric, dyes, chemicals, and paint. Being 55 years of age or older. Being male. Having bladder inflammation that is long-term (chronic). Having a history of cancer, including: A family history of bladder cancer. Personal experience with bladder cancer. Having had certain treatments for cancer before. These include: Medicines to kill cancer cells (chemotherapy). Strong X-ray beams or capsules high in energy to kill cancer cells and shrink tumors (radiation therapy). Having been exposed to arsenic. This is a chemical element that can poison you. What are the signs or symptoms? Early symptoms of this condition include: Seeing blood in your urine. Feeling pain when urinating. Having infections of your urinary system (urinary tract infections or UTIs) that happen often. Having to urinate sooner or more often than usual. Later symptoms of this condition include: Not being able to urinate. Pain on one side of your lower back. Loss of appetite. Weight loss. Tiredness (fatigue). Swelling in your feet. Bone pain. How is this diagnosed? This condition is diagnosed based on: Your medical history. A physical exam. Lab tests, such as urine tests. Imaging tests. Your symptoms. You may also have other tests or procedures done, such as: A cystoscopy. A narrow tube is inserted  into your bladder through the organ that connects your bladder to the outside of your body (urethra). This is done to view the lining of your bladder for tumors. A biopsy. This procedure involves removing a tissue sample to look at it under a microscope to see if cancer is present. It is important to find out: How deeply into the bladder wall cancer has grown. Whether cancer has spread to any other parts of your body. This may require blood tests or imaging tests, such as a CT scan, MRI, bone scan, or X-rays. How is this treated? Your health care provider may recommend one or more types of treatment based on the stage of your cancer. The most common types of treatment are: Surgery to remove the cancer. Procedures that may be done include: Removing a tumor on the inside wall of the bladder (transurethral resection). Removing the bladder (cystectomy). Radiation therapy. This is often used together with chemotherapy. Chemotherapy. Immunotherapy. This uses medicines to help your immune system destroy cancer cells. Follow these instructions at home: Take over-the-counter and prescription medicines only as told by your health care provider. Eat a healthy diet. Some of your treatments might affect your appetite. Do not use any products that contain nicotine or tobacco, such as cigarettes, e-cigarettes, and chewing tobacco. If you need help quitting, ask your health care provider. Consider joining a support group. This may help you learn to cope with the stress of having bladder cancer. Tell your cancer care team if you develop side effects. Your team may be able to recommend ways to get relief. Keep all follow-up visits as told by your health care provider. This is important. Where to find more information American   Cancer Society: www.cancer.org National Cancer Institute (NCI): www.cancer.gov Contact a health care provider if: You have symptoms of a urinary tract infection. These  include: Fever. Chills. Weakness. Muscle aches. Pain in your abdomen. Urge to urinate that is stronger and happens more often than usual. Burning feeling in the bladder or urethra when you urinate. Get help right away if: There is blood in your urine. You cannot urinate. You have severe pain or other symptoms that do not go away. Summary Bladder cancer is a condition in which tumors grow in the bladder and cause illness. This condition is diagnosed based on your medical history, a physical exam, lab tests, imaging tests, and your symptoms. Your health care provider may recommend one or more types of treatment based on the stage of your cancer. Consider joining a support group. This may help you learn to cope with the stress of having bladder cancer. This information is not intended to replace advice given to you by your health care provider. Make sure you discuss any questions you have with your health care provider. Document Revised: 12/03/2018 Document Reviewed: 12/03/2018 Elsevier Patient Education  2022 Elsevier Inc.  

## 2021-02-20 ENCOUNTER — Other Ambulatory Visit: Payer: Self-pay

## 2021-02-20 ENCOUNTER — Encounter: Payer: Self-pay | Admitting: Family

## 2021-02-20 ENCOUNTER — Ambulatory Visit (INDEPENDENT_AMBULATORY_CARE_PROVIDER_SITE_OTHER): Payer: Medicare Other | Admitting: Family

## 2021-02-20 VITALS — BP 149/77 | HR 73 | Temp 97.5°F | Ht 71.0 in | Wt 135.4 lb

## 2021-02-20 DIAGNOSIS — R350 Frequency of micturition: Secondary | ICD-10-CM

## 2021-02-20 DIAGNOSIS — C8599 Non-Hodgkin lymphoma, unspecified, extranodal and solid organ sites: Secondary | ICD-10-CM

## 2021-02-20 DIAGNOSIS — Z8551 Personal history of malignant neoplasm of bladder: Secondary | ICD-10-CM

## 2021-02-20 DIAGNOSIS — N401 Enlarged prostate with lower urinary tract symptoms: Secondary | ICD-10-CM | POA: Diagnosis not present

## 2021-02-20 DIAGNOSIS — E785 Hyperlipidemia, unspecified: Secondary | ICD-10-CM

## 2021-02-20 DIAGNOSIS — I700 Atherosclerosis of aorta: Secondary | ICD-10-CM | POA: Diagnosis not present

## 2021-02-20 DIAGNOSIS — E559 Vitamin D deficiency, unspecified: Secondary | ICD-10-CM

## 2021-02-20 DIAGNOSIS — J42 Unspecified chronic bronchitis: Secondary | ICD-10-CM | POA: Diagnosis not present

## 2021-02-20 NOTE — Patient Instructions (Signed)

## 2021-02-20 NOTE — Progress Notes (Signed)
Subjective:    Patient ID: Kenneth Reed, male    DOB: 1945-06-27, 75 y.o.   MRN: [id number]  Chief Complaint  Patient presents with   Hypertension   Pt presents to the office today for chronic follow up. He is followed by the [facility] annually. He is followed by Urologists every 6 months for BPH and hx bladder cancer. He is followed by ENT as needed. He is followed by Pulmonologist annually for COPD and lung nodules. He reports his breathing is stable and taking Advair BID.   Hypertension This is a chronic problem. The current episode started more than 1 year ago. The problem has been waxing and waning since onset. The problem is uncontrolled. Pertinent negatives include no malaise/fatigue, peripheral edema or shortness of breath. Risk factors for coronary artery disease include dyslipidemia, obesity and male gender. The current treatment provides mild improvement.  Hyperlipidemia This is a chronic problem. The current episode started more than 1 year ago. The problem is controlled. Pertinent negatives include no shortness of breath. Current antihyperlipidemic treatment includes statins. The current treatment provides moderate improvement of lipids. Risk factors for coronary artery disease include dyslipidemia, male sex, hypertension and a sedentary lifestyle.  Benign Prostatic Hypertrophy This is a chronic problem. The current episode started more than 1 year ago. Irritative symptoms include nocturia. The treatment provided moderate relief.     Review of Systems  Constitutional:  Negative for malaise/fatigue.  Respiratory:  Negative for shortness of breath.   Genitourinary:  Positive for nocturia.  All other systems reviewed and are negative.      Objective:   Physical Exam Vitals reviewed.  Constitutional:      General: He is not in acute distress.    Appearance: He is well-developed.     Comments: underweight  HENT:     Head: Normocephalic.     Right Ear: Tympanic membrane normal.      Left Ear: Tympanic membrane normal.  Eyes:     General:        Right eye: No discharge.        Left eye: No discharge.     Pupils: Pupils are equal, round, and reactive to light.  Neck:     Thyroid: No thyromegaly.  Cardiovascular:     Rate and Rhythm: Normal rate and regular rhythm.     Heart sounds: Normal heart sounds. No murmur heard. Pulmonary:     Effort: Pulmonary effort is normal. No respiratory distress.     Breath sounds: Normal breath sounds. No wheezing.  Abdominal:     General: Bowel sounds are normal. There is no distension.     Palpations: Abdomen is soft.     Tenderness: There is no abdominal tenderness.  Musculoskeletal:        General: No tenderness. Normal range of motion.     Cervical back: Normal range of motion and neck supple.  Skin:    General: Skin is warm and dry.     Findings: No erythema or rash.  Neurological:     Mental Status: He is alert and oriented to person, place, and time.     Cranial Nerves: No cranial nerve deficit.     Deep Tendon Reflexes: Reflexes are normal and symmetric.  Psychiatric:        Behavior: Behavior normal.        Thought Content: Thought content normal.        Judgment: Judgment normal.      BP Marland Kitchen)  152/84   Pulse 73   Temp (!) 97.5 F (36.4 C) (Temporal)   Ht 5\' 11"  (1.803 m)   Wt 135 lb 6.4 oz (61.4 kg)   BMI 18.88 kg/m      Assessment & Plan:  Kenneth Reed comes in today with chief complaint of Hypertension   Diagnosis and orders addressed:  1. Non-Hodgkin's lymphoma of tonsil ([place])  2. Aortic atherosclerosis ([place])  3. Chronic bronchitis, unspecified chronic bronchitis type ([place])  4. Benign prostatic hyperplasia with urinary frequency  5. Vitamin D deficiency  6. History of bladder cancer  7. Hyperlipidemia, unspecified hyperlipidemia type    Labs pending Health Maintenance reviewed Diet and exercise encouraged  Follow up plan: 4 months    Evelina Dun, FNP

## 2021-03-01 ENCOUNTER — Ambulatory Visit (INDEPENDENT_AMBULATORY_CARE_PROVIDER_SITE_OTHER): Payer: Medicare Other

## 2021-03-01 VITALS — Ht 71.0 in | Wt 135.0 lb

## 2021-03-01 DIAGNOSIS — C679 Malignant neoplasm of bladder, unspecified: Secondary | ICD-10-CM | POA: Insufficient documentation

## 2021-03-01 DIAGNOSIS — Z85038 Personal history of other malignant neoplasm of large intestine: Secondary | ICD-10-CM | POA: Insufficient documentation

## 2021-03-01 DIAGNOSIS — Z0000 Encounter for general adult medical examination without abnormal findings: Secondary | ICD-10-CM

## 2021-03-01 DIAGNOSIS — C189 Malignant neoplasm of colon, unspecified: Secondary | ICD-10-CM | POA: Insufficient documentation

## 2021-03-01 DIAGNOSIS — I6381 Other cerebral infarction due to occlusion or stenosis of small artery: Secondary | ICD-10-CM | POA: Insufficient documentation

## 2021-03-01 NOTE — Patient Instructions (Signed)
[name] , Thank you for taking time to come for your Medicare Wellness Visit. I appreciate your ongoing commitment to your health goals. Please review the following plan we discussed and let me know if I can assist you in the future.   Screening recommendations/referrals: Colonoscopy: Done 09/08/2013 - Call to see if you need to repeat Recommended yearly ophthalmology/optometry visit for glaucoma screening and checkup Recommended yearly dental visit for hygiene and checkup  Vaccinations: Influenza vaccine: Done 01/23/2021 - Repeat annually Pneumococcal vaccine:  Done 02/03/2015 & 02/20/2016 & 12/28/2020 Tdap vaccine: Done 10/06/2018 - Repeat in 10 years  Shingles vaccine: Done 10/06/2018 & 12/28/2020   Covid-19:  Done 05/22/2019 & 06/19/2019  Advanced directives: Please bring a copy of your health care power of attorney and living will to the office to be added to your chart at your convenience.   Conditions/risks identified: Aim for 30 minutes of exercise or brisk walking each day, drink 6-8 glasses of water and eat lots of fruits and vegetables.   Next appointment: Follow up in one year for your annual wellness visit.  Preventive Care 65 Years and Older, Male  Preventive care refers to lifestyle choices and visits with your health care provider that can promote health and wellness. What does preventive care include? A yearly physical exam. This is also called an annual well check. Dental exams once or twice a year. Routine eye exams. Ask your health care provider how often you should have your eyes checked. Personal lifestyle choices, including: Daily care of your teeth and gums. Regular physical activity. Eating a healthy diet. Avoiding tobacco and drug use. Limiting alcohol use. Practicing safe sex. Taking low doses of aspirin every day. Taking vitamin and mineral supplements as recommended by your health care provider. What happens during an annual well check? The services and  screenings done by your health care provider during your annual well check will depend on your age, overall health, lifestyle risk factors, and family history of disease. Counseling  Your health care provider may ask you questions about your: Alcohol use. Tobacco use. Drug use. Emotional well-being. Home and relationship well-being. Sexual activity. Eating habits. History of falls. Memory and ability to understand (cognition). Work and work Statistician. Screening  You may have the following tests or measurements: Height, weight, and BMI. Blood pressure. Lipid and cholesterol levels. These may be checked every 5 years, or more frequently if you are over 66 years old. Skin check. Lung cancer screening. You may have this screening every year starting at age 14 if you have a 30-pack-year history of smoking and currently smoke or have quit within the past 15 years. Fecal occult blood test (FOBT) of the stool. You may have this test every year starting at age 70. Flexible sigmoidoscopy or colonoscopy. You may have a sigmoidoscopy every 5 years or a colonoscopy every 10 years starting at age 12. Prostate cancer screening. Recommendations will vary depending on your family history and other risks. Hepatitis C blood test. Hepatitis B blood test. Sexually transmitted disease (STD) testing. Diabetes screening. This is done by checking your blood sugar (glucose) after you have not eaten for a while (fasting). You may have this done every 1-3 years. Abdominal aortic aneurysm (AAA) screening. You may need this if you are a current or former smoker. Osteoporosis. You may be screened starting at age 68 if you are at high risk. Talk with your health care provider about your test results, treatment options, and if necessary, the need  for more tests. Vaccines  Your health care provider may recommend certain vaccines, such as: Influenza vaccine. This is recommended every year. Tetanus, diphtheria, and  acellular pertussis (Tdap, Td) vaccine. You may need a Td booster every 10 years. Zoster vaccine. You may need this after age 1. Pneumococcal 13-valent conjugate (PCV13) vaccine. One dose is recommended after age 43. Pneumococcal polysaccharide (PPSV23) vaccine. One dose is recommended after age 68. Talk to your health care provider about which screenings and vaccines you need and how often you need them. This information is not intended to replace advice given to you by your health care provider. Make sure you discuss any questions you have with your health care provider. Document Released: 04/22/2015 Document Revised: 12/14/2015 Document Reviewed: 01/25/2015 Elsevier Interactive Patient Education  2017 [place] Prevention in the Home Falls can cause injuries. They can happen to people of all ages. There are many things you can do to make your home safe and to help prevent falls. What can I do on the outside of my home? Regularly fix the edges of walkways and driveways and fix any cracks. Remove anything that might make you trip as you walk through a door, such as a raised step or threshold. Trim any bushes or trees on the path to your home. Use bright outdoor lighting. Clear any walking paths of anything that might make someone trip, such as rocks or tools. Regularly check to see if handrails are loose or broken. Make sure that both sides of any steps have handrails. Any raised decks and porches should have guardrails on the edges. Have any leaves, snow, or ice cleared regularly. Use sand or salt on walking paths during winter. Clean up any spills in your garage right away. This includes oil or grease spills. What can I do in the bathroom? Use night lights. Install grab bars by the toilet and in the tub and shower. Do not use towel bars as grab bars. Use non-skid mats or decals in the tub or shower. If you need to sit down in the shower, use a plastic, non-slip stool. Keep  the floor dry. Clean up any water that spills on the floor as soon as it happens. Remove soap buildup in the tub or shower regularly. Attach bath mats securely with double-sided non-slip rug tape. Do not have throw rugs and other things on the floor that can make you trip. What can I do in the bedroom? Use night lights. Make sure that you have a light by your bed that is easy to reach. Do not use any sheets or blankets that are too big for your bed. They should not hang down onto the floor. Have a firm chair that has side arms. You can use this for support while you get dressed. Do not have throw rugs and other things on the floor that can make you trip. What can I do in the kitchen? Clean up any spills right away. Avoid walking on wet floors. Keep items that you use a lot in easy-to-reach places. If you need to reach something above you, use a strong step stool that has a grab bar. Keep electrical cords out of the way. Do not use floor polish or wax that makes floors slippery. If you must use wax, use non-skid floor wax. Do not have throw rugs and other things on the floor that can make you trip. What can I do with my stairs? Do not leave any items on the stairs.  Make sure that there are handrails on both sides of the stairs and use them. Fix handrails that are broken or loose. Make sure that handrails are as long as the stairways. Check any carpeting to make sure that it is firmly attached to the stairs. Fix any carpet that is loose or worn. Avoid having throw rugs at the top or bottom of the stairs. If you do have throw rugs, attach them to the floor with carpet tape. Make sure that you have a light switch at the top of the stairs and the bottom of the stairs. If you do not have them, ask someone to add them for you. What else can I do to help prevent falls? Wear shoes that: Do not have high heels. Have rubber bottoms. Are comfortable and fit you well. Are closed at the toe. Do not  wear sandals. If you use a stepladder: Make sure that it is fully opened. Do not climb a closed stepladder. Make sure that both sides of the stepladder are locked into place. Ask someone to hold it for you, if possible. Clearly mark and make sure that you can see: Any grab bars or handrails. First and last steps. Where the edge of each step is. Use tools that help you move around (mobility aids) if they are needed. These include: Canes. Walkers. Scooters. Crutches. Turn on the lights when you go into a dark area. Replace any light bulbs as soon as they burn out. Set up your furniture so you have a clear path. Avoid moving your furniture around. If any of your floors are uneven, fix them. If there are any pets around you, be aware of where they are. Review your medicines with your doctor. Some medicines can make you feel dizzy. This can increase your chance of falling. Ask your doctor what other things that you can do to help prevent falls. This information is not intended to replace advice given to you by your health care provider. Make sure you discuss any questions you have with your health care provider. Document Released: 01/20/2009 Document Revised: 09/01/2015 Document Reviewed: 04/30/2014 Elsevier Interactive Patient Education  2017 [employer].

## 2021-03-01 NOTE — Progress Notes (Signed)
Subjective:   Kenneth Reed is a 75 y.o. male who presents for Medicare Annual/Subsequent preventive examination.  Virtual Visit via Telephone Note  I connected with  Kenneth Reed on 03/01/21 at  4:15 PM EST by telephone and verified that I am speaking with the correct person using two identifiers.  Location: Patient: Home Provider: [facility] Persons participating in the virtual visit: patient/Nurse Health Advisor   I discussed the limitations, risks, security and privacy concerns of performing an evaluation and management service by telephone and the availability of in person appointments. The patient expressed understanding and agreed to proceed.  Interactive audio and video telecommunications were attempted between this nurse and patient, however failed, due to patient having technical difficulties OR patient did not have access to video capability.  We continued and completed visit with audio only.  Some vital signs may be absent or patient reported.   Kenneth Mcelwain E Keats Kingry, LPN   Review of Systems     Cardiac Risk Factors include: advanced age (>14men, >40 women);male gender;Other (see comment);dyslipidemia, Risk factor comments: COPD, hx of bladder and colon cancer, Non-Hodgkin's Lymphoma, underweight     Objective:    Today's Vitals   03/01/21 1612  Weight: 135 lb (61.2 kg)  Height: 5\' 11"  (1.803 m)   Body mass index is 18.83 kg/m.  Advanced Directives 03/01/2021 12/03/2020 04/15/2020 04/12/2020 03/30/2020 03/24/2020 09/03/2019  Does Patient Have a Medical Advance Directive? No No No No No No No  Would patient like information on creating a medical advance directive? No - Patient declined - No - Patient declined No - Patient declined - No - Patient declined No - Patient declined  Pre-existing out of facility DNR order (yellow form or pink MOST form) - - - - - - -    Current Medications (verified) Outpatient Encounter Medications as of 03/01/2021  Medication Sig   acetaminophen  (TYLENOL) 500 MG tablet Take 1,000 mg by mouth every 6 (six) hours as needed for moderate pain.    albuterol (VENTOLIN HFA) 108 (90 Base) MCG/ACT inhaler Inhale 2 puffs into the lungs every 4 (four) hours as needed.   aspirin EC 81 MG tablet Take 1 tablet (81 mg total) by mouth daily. Swallow whole.   fluticasone (FLONASE) 50 MCG/ACT nasal spray Place 1 spray into both nostrils 2 (two) times daily as needed for allergies or rhinitis.   Fluticasone-Salmeterol (ADVAIR) 250-50 MCG/DOSE AEPB Inhale 1 puff into the lungs 2 (two) times daily.   rosuvastatin (CRESTOR) 10 MG tablet Take 1 tablet (10 mg total) by mouth daily.   tamsulosin (FLOMAX) 0.4 MG CAPS capsule Take 1 capsule (0.4 mg total) by mouth in the morning and at bedtime.   Tiotropium Bromide Monohydrate (SPIRIVA HANDIHALER IN) Inhale into the lungs.   No facility-administered encounter medications on file as of 03/01/2021.    Allergies (verified) Patient has no known allergies.   History: Past Medical History:  Diagnosis Date   Bladder cancer [facility]) 2010   Dr. [place] Pink operated; no need for chemo; recurred locally  in 2013 locally.     BPH (benign prostatic hyperplasia)    Cancer ([place]) 03/26/12   High Grade NonHodgkin's B- Cell Lymphoma   Colon cancer (HCC) 2010   Dr. Anthony Sar operated; stage II.  No need for chemo.   COPD (chronic obstructive pulmonary disease) (HCC)    Fatigue    History of colon cancer 01/07/2013   HOH (hard of hearing)    Kidney stone  Non-Hodgkin's lymphoma of head ([place]) 2014   Status post chemotherapy 04/18/2012 - 06/20/12    4 Cycles of R-CHOP with 3 Doses of Prophylactic Intrathecal chemo.   Weight loss 07/11/12 note   Past Surgical History:  Procedure Laterality Date   Biospy Left Tonsil  03/26/12   Non - Hodgkins B-Cell Lympyhoma   BLADDER SURGERY  2010   CATARACT EXTRACTION W/PHACO Right 03/30/2020   Procedure: CATARACT EXTRACTION PHACO AND INTRAOCULAR LENS PLACEMENT ([place]);  Surgeon: Baruch Goldmann, MD;  Location: [facility];  Service: Ophthalmology;  Laterality: Right;  CDE: 15.63   CATARACT EXTRACTION W/PHACO Left 04/15/2020   Procedure: CATARACT EXTRACTION PHACO AND INTRAOCULAR LENS PLACEMENT LEFT EYE;  Surgeon: Baruch Goldmann, MD;  Location: [facility];  Service: Ophthalmology;  Laterality: Left;  CDE 10.20   COLECTOMY  2010   COLONOSCOPY  2012   next one 2014.    CYSTOSCOPY W/ RETROGRADES Bilateral 09/09/2019   Procedure: CYSTOSCOPY WITH RETROGRADE PYELOGRAM;  Surgeon: Cleon Gustin, MD;  Location: [facility];  Service: Urology;  Laterality: Bilateral;   HAND TENDON SURGERY Bilateral    NASAL POLYP SURGERY  1983   TRANSURETHRAL RESECTION OF BLADDER TUMOR N/A 09/09/2019   Procedure: TRANSURETHRAL RESECTION OF BLADDER TUMOR (TURBT);  Surgeon: Cleon Gustin, MD;  Location: [facility];  Service: Urology;  Laterality: N/A;   Family History  Problem Relation Age of Onset   Cancer Father        leukemia   Cancer Maternal Aunt        leukemia   Cancer Paternal Grandmother        leukemia   Narcolepsy Daughter    Social History   Socioeconomic History   Marital status: Single    Spouse name: Not on file   Number of children: 2   Years of education: GED   Highest education level: GED or equivalent  Occupational History   Occupation: Retired    Fish farm manager: [employer]    Comment: textile   Tobacco Use   Smoking status: Never   Smokeless tobacco: Former    Types: Chew    Quit date: 2010  Vaping Use   Vaping Use: Never used  Substance and Sexual Activity   Alcohol use: Yes    Alcohol/week: 2.0 standard drinks    Types: 2 Cans of beer per week    Comment: few beers here and there, nothing regular   Drug use: No   Sexual activity: Not Currently  Other Topics Concern   Not on file  Social History Narrative   Lives with his daughter   Not very sociable - likes to stay home   Social Determinants of Health   Financial Resource Strain: Low Risk    Difficulty of Paying Living  Expenses: Not hard at all  Food Insecurity: No Food Insecurity   Worried About Charity fundraiser in the Last Year: Never true   Arboriculturist in the Last Year: Never true  Transportation Needs: No Transportation Needs   Lack of Transportation (Medical): No   Lack of Transportation (Non-Medical): No  Physical Activity: Sufficiently Active   Days of Exercise per Week: 7 days   Minutes of Exercise per Session: 30 min  Stress: No Stress Concern Present   Feeling of Stress : Only a little  Social Connections: Socially Isolated   Frequency of Communication with Friends and Family: Twice a week   Frequency of Social Gatherings with Friends and Family: Twice a  week   Attends Religious Services: Never   Active Member of Clubs or Organizations: No   Attends Music therapist: Never   Marital Status: Divorced    Tobacco Counseling Counseling given: Not Answered   Clinical Intake:  Pre-visit preparation completed: Yes  Pain : No/denies pain     BMI - recorded: 18.83 Nutritional Status: BMI <19  Underweight Nutritional Risks: None Diabetes: No  How often do you need to have someone help you when you read instructions, pamphlets, or other written materials from your doctor or pharmacy?: 1 - Never  Diabetic? no  Interpreter Needed?: No  Information entered by :: Cloe Sockwell, LPN   Activities of Daily Living In your present state of health, do you have any difficulty performing the following activities: 03/01/2021 04/12/2020  Hearing? Tempie Donning  Comment wears hearing aids -  Vision? N Y  Difficulty concentrating or making decisions? N N  Walking or climbing stairs? N N  Dressing or bathing? N N  Doing errands, shopping? N N  Preparing Food and eating ? N -  Using the Toilet? N -  In the past six months, have you accidently leaked urine? Y -  Comment enlarged prostate - routine visits with Urology -  Do you have problems with loss of bowel control? N -  Managing  your Medications? N -  Managing your Finances? N -  Housekeeping or managing your Housekeeping? N -  Some recent data might be hidden    Patient Care Team: Sharion Balloon, FNP as PCP - General (Nurse Practitioner) Melida Quitter, MD as Attending Physician (Otolaryngology) Miguel Dibble, MD as Attending Physician (Urology) Sadie Haber, MD as Referring Physician (Urology) Burke Keels, MD as Attending Physician (Surgery) Eloise Harman, DO as Consulting Physician (Gastroenterology)  Indicate any recent Medical Services you may have received from other than [facility] providers in the past year (date may be approximate).     Assessment:   This is a routine wellness examination for Alik.  Hearing/Vision screen Hearing Screening - Comments:: Wears hearing aids - from [facility] - Comments:: Denies vision difficulties - up to date with annual eye exams with [facility] in [place] did cataracts surgery  Dietary issues and exercise activities discussed: Current Exercise Habits: Home exercise routine, Type of exercise: walking, Time (Minutes): 30, Frequency (Times/Week): 7, Weekly Exercise (Minutes/Week): 210, Intensity: Mild, Exercise limited by: respiratory conditions(s)   Goals Addressed             This Visit's Progress    DIET - INCREASE WATER INTAKE   On track    Drink 4 to 6 bottles of water a day     Exercise 150 min/wk Moderate Activity   On track      Depression Screen PHQ 2/9 Scores 03/01/2021 11/24/2020 11/15/2020 07/12/2020 05/18/2020 02/16/2020 03/11/2018  PHQ - 2 Score 2 2 1  0 0 0 1  PHQ- 9 Score 4 3 3  - - - -    Fall Risk Fall Risk  03/01/2021 11/24/2020 11/15/2020 07/12/2020 05/18/2020  Falls in the past year? 0 0 0 0 0  Number falls in past yr: 0 - - - -  Injury with Fall? 0 - - - -  Risk for fall due to : No Fall Risks - - - -  Follow up Falls prevention discussed - - - -    FALL RISK PREVENTION PERTAINING TO THE HOME:  Any stairs in or  around  the home? No  If so, are there any without handrails? No  Home free of loose throw rugs in walkways, pet beds, electrical cords, etc? Yes  Adequate lighting in your home to reduce risk of falls? Yes   ASSISTIVE DEVICES UTILIZED TO PREVENT FALLS:  Life alert? No  Use of a cane, walker or w/c? No  Grab bars in the bathroom? Yes  Shower chair or bench in shower? No  Elevated toilet seat or a handicapped toilet? No   TIMED UP AND GO:  Was the test performed? No . Telephonic visit  Cognitive Function: MMSE - Mini Mental State Exam 07/23/2017  Orientation to time 5  Orientation to Place 5  Registration 3  Attention/ Calculation 5  Recall 3  Language- name 2 objects 2  Language- repeat 1  Language- follow 3 step command 3  Language- read & follow direction 1  Write a sentence 1  Copy design 1  Total score 30     6CIT Screen 03/01/2021  What Year? 0 points  What month? 0 points  What time? 0 points  Count back from 20 0 points  Months in reverse 0 points  Repeat phrase 0 points  Total Score 0    Immunizations Immunization History  Administered Date(s) Administered   Fluad Quad(high Dose 65+) 01/29/2020, 01/23/2021   Influenza Split 01/03/2009   Influenza, High Dose Seasonal PF 01/28/2018   Influenza,inj,Quad PF,6+ Mos 01/07/2013, 02/09/2014, 02/03/2015   Influenza-Unspecified 12/16/2017   Moderna Sars-Covid-2 Vaccination 05/22/2019, 06/19/2019   PFIZER Comirnaty(Gray Top)Covid-19 Tri-Sucrose Vaccine 06/19/2019   PNEUMOCOCCAL CONJUGATE-20 12/28/2020   Pneumococcal Conjugate-13 02/03/2015   Pneumococcal Polysaccharide-23 01/03/2009, 02/20/2016   Tdap 10/06/2018   Zoster Recombinat (Shingrix) 10/06/2018, 12/28/2020    TDAP status: Up to date  Flu Vaccine status: Up to date  Pneumococcal vaccine status: Up to date  Covid-19 vaccine status: Completed vaccines  Qualifies for Shingles Vaccine? Yes   Zostavax completed Yes   Shingrix Completed?:  Yes  Screening Tests Health Maintenance  Topic Date Due   COVID-19 Vaccine (3 - Moderna risk series) 07/17/2019   TETANUS/TDAP  10/05/2028   Pneumonia Vaccine 44+ Years old  Completed   INFLUENZA VACCINE  Completed   Hepatitis C Screening  Completed   Zoster Vaccines- Shingrix  Completed   HPV VACCINES  Aged Out   COLONOSCOPY (Pts 45-23yrs Insurance coverage will need to be confirmed)  Discontinued    Health Maintenance  Health Maintenance Due  Topic Date Due   COVID-19 Vaccine (3 - Moderna risk series) 07/17/2019    Colorectal cancer screening: Type of screening: Colonoscopy. Completed 09/08/2013. Repeat every 5 years - He will call and ask if he needs to repeat  Lung Cancer Screening: (Low Dose CT Chest recommended if Age 44-80 years, 30 pack-year currently smoking OR have quit w/in 15years.) does not qualify.   Additional Screening:  Hepatitis C Screening: does qualify; Completed 04/21/2015  Vision Screening: Recommended annual ophthalmology exams for early detection of glaucoma and other disorders of the eye. Is the patient up to date with their annual eye exam?  Yes  Who is the provider or what is the name of the office in which the patient attends annual eye exams? [facility] If pt is not established with a provider, would they like to be referred to a provider to establish care? No .   Dental Screening: Recommended annual dental exams for proper oral hygiene  Community Resource Referral / Chronic Care Management: CRR required this  visit?  No   CCM required this visit?  No      Plan:     I have personally reviewed and noted the following in the patient's chart:   Medical and social history Use of alcohol, tobacco or illicit drugs  Current medications and supplements including opioid prescriptions. Patient is not currently taking opioid prescriptions. Functional ability and status Nutritional status Physical activity Advanced directives List of other  physicians Hospitalizations, surgeries, and ER visits in previous 12 months Vitals Screenings to include cognitive, depression, and falls Referrals and appointments  In addition, I have reviewed and discussed with patient certain preventive protocols, quality metrics, and best practice recommendations. A written personalized care plan for preventive services as well as general preventive health recommendations were provided to patient.     Sandrea Hammond, LPN   15/08/6977   Nurse Notes: None

## 2021-05-18 ENCOUNTER — Ambulatory Visit (INDEPENDENT_AMBULATORY_CARE_PROVIDER_SITE_OTHER): Payer: Medicare Other | Admitting: Family

## 2021-05-18 ENCOUNTER — Encounter: Payer: Self-pay | Admitting: Family

## 2021-05-18 VITALS — BP 138/71 | HR 84 | Temp 98.4°F | Ht 71.0 in | Wt 135.6 lb

## 2021-05-18 DIAGNOSIS — J309 Allergic rhinitis, unspecified: Secondary | ICD-10-CM

## 2021-05-18 DIAGNOSIS — R350 Frequency of micturition: Secondary | ICD-10-CM

## 2021-05-18 DIAGNOSIS — J42 Unspecified chronic bronchitis: Secondary | ICD-10-CM

## 2021-05-18 DIAGNOSIS — Z8551 Personal history of malignant neoplasm of bladder: Secondary | ICD-10-CM

## 2021-05-18 DIAGNOSIS — N401 Enlarged prostate with lower urinary tract symptoms: Secondary | ICD-10-CM | POA: Diagnosis not present

## 2021-05-18 DIAGNOSIS — E785 Hyperlipidemia, unspecified: Secondary | ICD-10-CM

## 2021-05-18 DIAGNOSIS — E559 Vitamin D deficiency, unspecified: Secondary | ICD-10-CM | POA: Diagnosis not present

## 2021-05-18 DIAGNOSIS — I700 Atherosclerosis of aorta: Secondary | ICD-10-CM | POA: Diagnosis not present

## 2021-05-18 LAB — LIPID PANEL

## 2021-05-18 MED ORDER — ADVAIR HFA 230-21 MCG/ACT IN AERO: 2.0000 | INHALATION_SPRAY | Freq: Two times a day (BID) | RESPIRATORY_TRACT | 4 refills | Status: DC

## 2021-05-18 MED ORDER — CETIRIZINE HCL 10 MG PO TABS: 10.0000 mg | ORAL_TABLET | Freq: Every day | ORAL | 2 refills | Status: DC

## 2021-05-18 NOTE — Progress Notes (Signed)
Subjective:    Patient ID: Kenneth Reed, male    DOB: [birth year], 76 y.o.   MRN: [id number]  Chief Complaint  Patient presents with   Medical Management of Chronic Issues   Sinus Problem    Been going a long time    Pt presents to the office today for chronic follow up. He is followed by the [facility] annually. He is followed by Urologists every 6 months for BPH and hx bladder cancer. He is followed by ENT as needed. He is followed by Pulmonologist annually for COPD and lung nodules. He reports his breathing is stable and taking Advair BID.   He has aortic atherosclerosis and takes Crestor daily.  Sinus Problem This is a new problem. The current episode started 1 to 4 weeks ago. The problem has been gradually worsening since onset. There has been no fever. His pain is at a severity of 0/10. He is experiencing no pain. Associated symptoms include congestion, sinus pressure and sneezing. Pertinent negatives include no coughing, ear pain, headaches, shortness of breath or sore throat. Treatments tried: flonase, claritin. The treatment provided mild relief.  Benign Prostatic Hypertrophy This is a chronic problem. The current episode started more than 1 year ago. The problem has been waxing and waning since onset. Irritative symptoms include nocturia (3). Pertinent negatives include no hematuria or vomiting.  Hyperlipidemia This is a chronic problem. The current episode started more than 1 year ago. The problem is controlled. Pertinent negatives include no shortness of breath. Current antihyperlipidemic treatment includes statins. The current treatment provides moderate improvement of lipids. Risk factors for coronary artery disease include dyslipidemia, male sex and a sedentary lifestyle.     Review of Systems  HENT:  Positive for congestion, sinus pressure and sneezing. Negative for ear pain and sore throat.   Respiratory:  Negative for cough and shortness of breath.   Gastrointestinal:  Negative  for vomiting.  Genitourinary:  Positive for nocturia (3). Negative for hematuria.  Neurological:  Negative for headaches.  All other systems reviewed and are negative.     Objective:   Physical Exam Vitals reviewed.  Constitutional:      General: He is not in acute distress.    Appearance: He is well-developed.  HENT:     Head: Normocephalic.     Right Ear: Tympanic membrane and external ear normal.     Left Ear: Tympanic membrane and external ear normal.  Eyes:     General:        Right eye: No discharge.        Left eye: No discharge.     Pupils: Pupils are equal, round, and reactive to light.  Neck:     Thyroid: No thyromegaly.  Cardiovascular:     Rate and Rhythm: Normal rate and regular rhythm.     Heart sounds: Normal heart sounds. No murmur heard. Pulmonary:     Effort: Pulmonary effort is normal. No respiratory distress.     Breath sounds: Normal breath sounds. No wheezing.  Abdominal:     General: Bowel sounds are normal. There is no distension.     Palpations: Abdomen is soft.     Tenderness: There is no abdominal tenderness.  Musculoskeletal:        General: No tenderness. Normal range of motion.     Cervical back: Normal range of motion and neck supple.  Skin:    General: Skin is warm and dry.     Findings: No erythema or  rash.  Neurological:     Mental Status: He is alert and oriented to person, place, and time.     Cranial Nerves: No cranial nerve deficit.     Deep Tendon Reflexes: Reflexes are normal and symmetric.  Psychiatric:        Behavior: Behavior normal.        Thought Content: Thought content normal.        Judgment: Judgment normal.    BP 138/71    Pulse 84    Temp 98.4 F (36.9 C) (Temporal)    Ht _0  (1.803 m)    Wt 135 lb 9.6 oz (61.5 kg)    SpO2 97%    BMI 18.91 kg/m        Assessment & Plan:  Kenneth Reed comes in today with chief complaint of Medical Management of Chronic Issues and Sinus Problem (Been going a long time  )   Diagnosis and orders addressed:  1. Aortic atherosclerosis (HCC) - CMP14+EGFR - CBC with Differential/Platelet  2. Chronic bronchitis, unspecified chronic bronchitis type ([place]) - CMP14+EGFR - CBC with Differential/Platelet - fluticasone-salmeterol (ADVAIR HFA) 230-21 MCG/ACT inhaler; Inhale 2 puffs into the lungs 2 (two) times daily.  Dispense: 3 each; Refill: 4  3. Benign prostatic hyperplasia with urinary frequency - CMP14+EGFR - CBC with Differential/Platelet  4. Hyperlipidemia, unspecified hyperlipidemia type - CMP14+EGFR - CBC with Differential/Platelet - Lipid panel  5. Vitamin D deficiency - CMP14+EGFR - CBC with Differential/Platelet  6. Allergic rhinitis, unspecified seasonality, unspecified trigger Start zyrtec and flonase - cetirizine (ZYRTEC) 10 MG tablet; Take 1 tablet (10 mg total) by mouth daily.  Dispense: 90 tablet; Refill: 2 - CMP14+EGFR - CBC with Differential/Platelet  7. History of bladder cancer   Labs pending Health Maintenance reviewed Diet and exercise encouraged  Follow up plan: 6 months   Evelina Dun, FNP

## 2021-05-18 NOTE — Patient Instructions (Signed)
Allergic Rhinitis, Adult Allergic rhinitis is an allergic reaction that affects the mucous membrane inside the nose. The mucous membrane is the tissue that produces mucus. There are two types of allergic rhinitis: Seasonal. This type is also called hay fever and happens only during certain seasons. Perennial. This type can happen at any time of the year. Allergic rhinitis cannot be spread from person to person. This condition can be mild, moderate, or severe. It can develop at any age and may be outgrown. What are the causes? This condition is caused by allergens. These are things that can cause an allergic reaction. Allergens may differ for seasonal allergic rhinitis and perennial allergic rhinitis. Seasonal allergic rhinitis is triggered by pollen. Pollen can come from grasses, trees, and weeds. Perennial allergic rhinitis may be triggered by: Dust mites. Proteins in a pet's urine, saliva, or dander. Dander is dead skin cells from a pet. Smoke, mold, or car fumes. What increases the risk? You are more likely to develop this condition if you have a family history of allergies or other conditions related to allergies, including: Allergic conjunctivitis. This is inflammation of parts of the eyes and eyelids. Asthma. This condition affects the lungs and makes it hard to breathe. Atopic dermatitis or eczema. This is long term (chronic) inflammation of the skin. Food allergies. What are the signs or symptoms? Symptoms of this condition include: Sneezing or coughing. A stuffy nose (nasal congestion), itchy nose, or nasal discharge. Itchy eyes and tearing of the eyes. A feeling of mucus dripping down the back of your throat (postnasal drip). Trouble sleeping. Tiredness or fatigue. Headache. Sore throat. How is this diagnosed? This condition may be diagnosed with your symptoms, medical history, and physical exam. Your health care provider may check for related conditions, such  as: Asthma. Pink eye. This is eye inflammation caused by infection (conjunctivitis). Ear infection. Upper respiratory infection. This is an infection in the nose, throat, or upper airways. You may also have tests to find out which allergens trigger your symptoms. These may include skin tests or blood tests. How is this treated? There is no cure for this condition, but treatment can help control symptoms. Treatment may include: Taking medicines that block allergy symptoms, such as corticosteroids and antihistamines. Medicine may be given as a shot, nasal spray, or pill. Avoiding any allergens. Being exposed again and again to tiny amounts of allergens to help you build a defense against allergens (immunotherapy). This is done if other treatments have not helped. It may include: Allergy shots. These are injected medicines that have small amounts of allergen in them. Sublingual immunotherapy. This involves taking small doses of a medicine with allergen in it under your tongue. If these treatments do not work, your health care provider may prescribe newer, stronger medicines. Follow these instructions at home: Avoiding allergens Find out what you are allergic to and avoid those allergens. These are some things you can do to help avoid allergens: If you have perennial allergies: Replace carpet with wood, tile, or vinyl flooring. Carpet can trap dander and dust. Do not smoke. Do not allow smoking in your home. Change your heating and air conditioning filters at least once a month. If you have seasonal allergies, take these steps during allergy season: Keep windows closed as much as possible. Plan outdoor activities when pollen counts are lowest. Check pollen counts before you plan outdoor activities. When coming indoors, change clothing and shower before sitting on furniture or bedding. If you have a pet in  the house that produces allergens: Keep the pet out of the bedroom. Vacuum, sweep, and  dust regularly. General instructions Take over-the-counter and prescription medicines only as told by your health care provider. Drink enough fluid to keep your urine pale yellow. Keep all follow-up visits as told by your health care provider. This is important. Where to find more information American Academy of Allergy, Asthma & Immunology: www.aaaai.org Contact a health care provider if: You have a fever. You develop a cough that does not go away. You make whistling sounds when you breathe (wheeze). Your symptoms slow you down or stop you from doing your normal activities each day. Get help right away if: You have shortness of breath. This symptom may represent a serious problem that is an emergency. Do not wait to see if the symptom will go away. Get medical help right away. Call your local emergency services (911 in the [place] Do not drive yourself to the hospital. Summary Allergic rhinitis may be managed by taking medicines as directed and avoiding allergens. If you have seasonal allergies, keep windows closed as much as possible during allergy season. Contact your health care provider if you develop a fever or a cough that does not go away. This information is not intended to replace advice given to you by your health care provider. Make sure you discuss any questions you have with your health care provider. Document Revised: 05/15/2019 Document Reviewed: 03/24/2019 Elsevier Patient Education  2022 [employer].

## 2021-05-19 LAB — CMP14+EGFR
ALT: 18 IU/L (ref 0–44)
AST: 25 IU/L (ref 0–40)
Albumin/Globulin Ratio: 2.6 — ABNORMAL HIGH (ref 1.2–2.2)
Albumin: 4.9 g/dL — ABNORMAL HIGH (ref 3.7–4.7)
Alkaline Phosphatase: 60 IU/L (ref 44–121)
BUN/Creatinine Ratio: 11 (ref 10–24)
BUN: 10 mg/dL (ref 8–27)
Bilirubin Total: 0.9 mg/dL (ref 0.0–1.2)
CO2: 24 mmol/L (ref 20–29)
Calcium: 9.1 mg/dL (ref 8.6–10.2)
Chloride: 104 mmol/L (ref 96–106)
Creatinine, Ser: 0.92 mg/dL (ref 0.76–1.27)
Globulin, Total: 1.9 g/dL (ref 1.5–4.5)
Glucose: 91 mg/dL (ref 70–99)
Potassium: 4.4 mmol/L (ref 3.5–5.2)
Sodium: 141 mmol/L (ref 134–144)
Total Protein: 6.8 g/dL (ref 6.0–8.5)
eGFR: 85 mL/min/{1.73_m2} (ref >59)

## 2021-05-19 LAB — CBC WITH DIFFERENTIAL/PLATELET
Basophils Absolute: 0 10*3/uL (ref 0.0–0.2)
Basos: 1 %
EOS (ABSOLUTE): 0.2 10*3/uL (ref 0.0–0.4)
Eos: 4 %
Hematocrit: 42.9 % (ref 37.5–51.0)
Hemoglobin: 14.5 g/dL (ref 13.0–17.7)
Immature Grans (Abs): 0 10*3/uL (ref 0.0–0.1)
Immature Granulocytes: 0 %
Lymphocytes Absolute: 0.9 10*3/uL (ref 0.7–3.1)
Lymphs: 25 %
MCH: 30.9 pg (ref 26.6–33.0)
MCHC: 33.8 g/dL (ref 31.5–35.7)
MCV: 91 fL (ref 79–97)
Monocytes Absolute: 0.2 10*3/uL (ref 0.1–0.9)
Monocytes: 6 %
Neutrophils Absolute: 2.2 10*3/uL (ref 1.4–7.0)
Neutrophils: 64 %
Platelets: 203 10*3/uL (ref 150–450)
RBC: 4.7 x10E6/uL (ref 4.14–5.80)
RDW: 12.1 % (ref 11.6–15.4)
WBC: 3.5 10*3/uL (ref 3.4–10.8)

## 2021-05-19 LAB — LIPID PANEL
Chol/HDL Ratio: 2 ratio (ref 0.0–5.0)
Cholesterol, Total: 134 mg/dL (ref 100–199)
HDL: 66 mg/dL (ref >39)
LDL Chol Calc (NIH): 56 mg/dL (ref 0–99)
Triglycerides: 52 mg/dL (ref 0–149)
VLDL Cholesterol Cal: 12 mg/dL (ref 5–40)

## 2021-06-14 ENCOUNTER — Other Ambulatory Visit: Payer: Self-pay | Admitting: Family

## 2021-06-14 DIAGNOSIS — I700 Atherosclerosis of aorta: Secondary | ICD-10-CM

## 2021-06-14 DIAGNOSIS — E785 Hyperlipidemia, unspecified: Secondary | ICD-10-CM

## 2021-06-21 DIAGNOSIS — Z20828 Contact with and (suspected) exposure to other viral communicable diseases: Secondary | ICD-10-CM | POA: Diagnosis not present

## 2021-06-22 DIAGNOSIS — B338 Other specified viral diseases: Secondary | ICD-10-CM | POA: Diagnosis not present

## 2021-06-26 ENCOUNTER — Telehealth: Payer: Self-pay | Admitting: Family

## 2021-06-26 DIAGNOSIS — J42 Unspecified chronic bronchitis: Secondary | ICD-10-CM

## 2021-06-26 MED ORDER — ADVAIR HFA 230-21 MCG/ACT IN AERO: 2.0000 | INHALATION_SPRAY | Freq: Two times a day (BID) | RESPIRATORY_TRACT | 4 refills | Status: AC

## 2021-06-26 NOTE — Telephone Encounter (Signed)
Advair Prescription sent to pharmacy  ° °

## 2021-06-26 NOTE — Telephone Encounter (Signed)
?  Prescription Request ? ?06/26/2021 ? ?Is this a "Controlled Substance" medicine? no ? ?Have you seen your PCP in the last 2 weeks? no ? ?If YES, route message to pool  -  If NO, patient needs to be scheduled for appointment. ? ?What is the name of the medication or equipment? Wixela Inhaler ? ?Have you contacted your pharmacy to request a refill? no ? ?Which pharmacy would you like this sent to? [facility] ? ? ?Patient notified that their request is being sent to the clinical staff for review and that they should receive a response within 2 business days.  ? ?Lenna Gilford' pt. ? ?He has been out for 3 weeks. ? ?He has been getting it at [facility] he wants RX sent to [facility] today! ? ?Please call pt. ? ?  ?

## 2021-06-26 NOTE — Telephone Encounter (Signed)
Please advise, Advair is listed on med list, is [facility] Ruts just different brand name? ?

## 2021-06-28 DIAGNOSIS — Z20822 Contact with and (suspected) exposure to covid-19: Secondary | ICD-10-CM | POA: Diagnosis not present

## 2021-07-12 DIAGNOSIS — Z20822 Contact with and (suspected) exposure to covid-19: Secondary | ICD-10-CM | POA: Diagnosis not present

## 2021-07-24 DIAGNOSIS — Z20822 Contact with and (suspected) exposure to covid-19: Secondary | ICD-10-CM | POA: Diagnosis not present

## 2021-08-14 DIAGNOSIS — Z20822 Contact with and (suspected) exposure to covid-19: Secondary | ICD-10-CM | POA: Diagnosis not present

## 2021-08-17 DIAGNOSIS — Z20822 Contact with and (suspected) exposure to covid-19: Secondary | ICD-10-CM | POA: Diagnosis not present

## 2021-11-14 ENCOUNTER — Encounter: Payer: Self-pay | Admitting: Family

## 2021-11-14 ENCOUNTER — Ambulatory Visit (INDEPENDENT_AMBULATORY_CARE_PROVIDER_SITE_OTHER): Payer: Medicare Other | Admitting: Family

## 2021-11-14 VITALS — BP 145/82 | HR 108 | Temp 97.3°F | Ht 71.0 in | Wt 132.6 lb

## 2021-11-14 DIAGNOSIS — E785 Hyperlipidemia, unspecified: Secondary | ICD-10-CM | POA: Diagnosis not present

## 2021-11-14 DIAGNOSIS — N401 Enlarged prostate with lower urinary tract symptoms: Secondary | ICD-10-CM

## 2021-11-14 DIAGNOSIS — I10 Essential (primary) hypertension: Secondary | ICD-10-CM | POA: Diagnosis not present

## 2021-11-14 DIAGNOSIS — E559 Vitamin D deficiency, unspecified: Secondary | ICD-10-CM | POA: Diagnosis not present

## 2021-11-14 DIAGNOSIS — I700 Atherosclerosis of aorta: Secondary | ICD-10-CM | POA: Diagnosis not present

## 2021-11-14 DIAGNOSIS — Z8551 Personal history of malignant neoplasm of bladder: Secondary | ICD-10-CM | POA: Diagnosis not present

## 2021-11-14 DIAGNOSIS — R350 Frequency of micturition: Secondary | ICD-10-CM | POA: Diagnosis not present

## 2021-11-14 DIAGNOSIS — J42 Unspecified chronic bronchitis: Secondary | ICD-10-CM | POA: Diagnosis not present

## 2021-11-14 LAB — CBC WITH DIFFERENTIAL/PLATELET
Basophils Absolute: 0 10*3/uL (ref 0.0–0.2)
Basos: 1 %
EOS (ABSOLUTE): 0.1 10*3/uL (ref 0.0–0.4)
Eos: 2 %
Hematocrit: 42.6 % (ref 37.5–51.0)
Hemoglobin: 15 g/dL (ref 13.0–17.7)
Immature Grans (Abs): 0 10*3/uL (ref 0.0–0.1)
Immature Granulocytes: 0 %
Lymphocytes Absolute: 0.9 10*3/uL (ref 0.7–3.1)
Lymphs: 30 %
MCH: 32.5 pg (ref 26.6–33.0)
MCHC: 35.2 g/dL (ref 31.5–35.7)
MCV: 92 fL (ref 79–97)
Monocytes Absolute: 0.2 10*3/uL (ref 0.1–0.9)
Monocytes: 6 %
Neutrophils Absolute: 1.8 10*3/uL (ref 1.4–7.0)
Neutrophils: 61 %
Platelets: 223 10*3/uL (ref 150–450)
RBC: 4.62 x10E6/uL (ref 4.14–5.80)
RDW: 12.8 % (ref 11.6–15.4)
WBC: 3 10*3/uL — ABNORMAL LOW (ref 3.4–10.8)

## 2021-11-14 LAB — CMP14+EGFR
ALT: 15 IU/L (ref 0–44)
AST: 19 IU/L (ref 0–40)
Albumin/Globulin Ratio: 2.4 — ABNORMAL HIGH (ref 1.2–2.2)
Albumin: 4.7 g/dL (ref 3.8–4.8)
Alkaline Phosphatase: 57 IU/L (ref 44–121)
BUN/Creatinine Ratio: 9 — ABNORMAL LOW (ref 10–24)
BUN: 8 mg/dL (ref 8–27)
Bilirubin Total: 0.8 mg/dL (ref 0.0–1.2)
CO2: 22 mmol/L (ref 20–29)
Calcium: 9.3 mg/dL (ref 8.6–10.2)
Chloride: 101 mmol/L (ref 96–106)
Creatinine, Ser: 0.89 mg/dL (ref 0.76–1.27)
Globulin, Total: 2 g/dL (ref 1.5–4.5)
Glucose: 107 mg/dL — ABNORMAL HIGH (ref 70–99)
Potassium: 4.4 mmol/L (ref 3.5–5.2)
Sodium: 139 mmol/L (ref 134–144)
Total Protein: 6.7 g/dL (ref 6.0–8.5)
eGFR: 88 mL/min/{1.73_m2} (ref >59)

## 2021-11-14 MED ORDER — LISINOPRIL 20 MG PO TABS: 20.0000 mg | ORAL_TABLET | Freq: Every day | ORAL | 3 refills | Status: DC

## 2021-11-14 NOTE — Progress Notes (Signed)
Subjective:    Patient ID: Kenneth Reed, male    DOB: 08-Nov-1945, 76 y.o.   MRN: [id number]  Chief Complaint  Patient presents with   Medical Management of Chronic Issues    Sob    Pt presents to the office today for chronic follow up. He is followed by [facility] annually. He is followed by Urologists every 6 months for BPH and hx bladder cancer. He is followed by ENT as needed. He is followed by Pulmonologist annually for COPD and lung nodules. He reports his breathing is stable and taking Advair BID.    He has aortic atherosclerosis and takes Crestor daily. Benign Prostatic Hypertrophy This is a chronic problem. The current episode started more than 1 year ago. Irritative symptoms include nocturia (3).  Hyperlipidemia This is a chronic problem. The current episode started more than 1 year ago. The problem is controlled. Current antihyperlipidemic treatment includes statins. The current treatment provides moderate improvement of lipids. Risk factors for coronary artery disease include dyslipidemia, male sex, hypertension and a sedentary lifestyle.      Review of Systems  Genitourinary:  Positive for nocturia (3).  All other systems reviewed and are negative.      Objective:   Physical Exam Vitals reviewed.  Constitutional:      General: He is not in acute distress.    Appearance: He is well-developed.  HENT:     Head: Normocephalic.     Right Ear: Tympanic membrane normal.     Left Ear: Tympanic membrane normal.     Ears:     Comments: HOH Eyes:     General:        Right eye: No discharge.        Left eye: No discharge.     Pupils: Pupils are equal, round, and reactive to light.  Neck:     Thyroid: No thyromegaly.  Cardiovascular:     Rate and Rhythm: Normal rate and regular rhythm.     Heart sounds: Normal heart sounds. No murmur heard. Pulmonary:     Effort: Pulmonary effort is normal. No respiratory distress.     Breath sounds: Normal breath sounds. No wheezing.   Abdominal:     General: Bowel sounds are normal. There is no distension.     Palpations: Abdomen is soft.     Tenderness: There is no abdominal tenderness.  Musculoskeletal:        General: No tenderness. Normal range of motion.     Cervical back: Normal range of motion and neck supple.  Skin:    General: Skin is warm and dry.     Findings: No erythema or rash.  Neurological:     Mental Status: He is alert and oriented to person, place, and time.     Cranial Nerves: No cranial nerve deficit.     Deep Tendon Reflexes: Reflexes are normal and symmetric.  Psychiatric:        Behavior: Behavior normal.        Thought Content: Thought content normal.        Judgment: Judgment normal.       BP (!) 145/82   Pulse (!) 108   Temp (!) 97.3 F (36.3 C) (Oral)   Ht '5\' 11"'  (1.803 m)   Wt 132 lb 9.6 oz (60.1 kg)   SpO2 99%   BMI 18.49 kg/m      Assessment & Plan:  Kenneth Reed comes in today with chief complaint of  Medical Management of Chronic Issues (Sob )   Diagnosis and orders addressed:  1. Chronic bronchitis, unspecified chronic bronchitis type ([place]) - CMP14+EGFR - CBC with Differential/Platelet  2. Aortic atherosclerosis (HCC) - CMP14+EGFR - CBC with Differential/Platelet  3. Benign prostatic hyperplasia with urinary frequency - CMP14+EGFR - CBC with Differential/Platelet  4. Vitamin D deficiency - CMP14+EGFR - CBC with Differential/Platelet  5. Hyperlipidemia, unspecified hyperlipidemia type - CMP14+EGFR - CBC with Differential/Platelet  6. History of bladder cancer - CMP14+EGFR - CBC with Differential/Platelet  7. Primary hypertension Start Lisinopril 20 mg  RTO in 2-4 weeks  - lisinopril (ZESTRIL) 20 MG tablet; Take 1 tablet (20 mg total) by mouth daily.  Dispense: 90 tablet; Refill: 3 - CMP14+EGFR - CBC with Differential/Platelet   Labs pending Health Maintenance reviewed Diet and exercise encouraged  Follow up plan: 2-4 weeks to recheck  HTN  Evelina Dun, FNP

## 2021-11-14 NOTE — Patient Instructions (Signed)
Hypertension, Adult High blood pressure (hypertension) is when the force of blood pumping through the arteries is too strong. The arteries are the blood vessels that carry blood from the heart throughout the body. Hypertension forces the heart to work harder to pump blood and may cause arteries to become narrow or stiff. Untreated or uncontrolled hypertension can lead to a heart attack, heart failure, a stroke, kidney disease, and other problems. A blood pressure reading consists of a higher number over a lower number. Ideally, your blood pressure should be below 120/80. The first ("top") number is called the systolic pressure. It is a measure of the pressure in your arteries as your heart beats. The second ("bottom") number is called the diastolic pressure. It is a measure of the pressure in your arteries as the heart relaxes. What are the causes? The exact cause of this condition is not known. There are some conditions that result in high blood pressure. What increases the risk? Certain factors may make you more likely to develop high blood pressure. Some of these risk factors are under your control, including: Smoking. Not getting enough exercise or physical activity. Being overweight. Having too much fat, sugar, calories, or salt (sodium) in your diet. Drinking too much alcohol. Other risk factors include: Having a personal history of heart disease, diabetes, high cholesterol, or kidney disease. Stress. Having a family history of high blood pressure and high cholesterol. Having obstructive sleep apnea. Age. The risk increases with age. What are the signs or symptoms? High blood pressure may not cause symptoms. Very high blood pressure (hypertensive crisis) may cause: Headache. Fast or irregular heartbeats (palpitations). Shortness of breath. Nosebleed. Nausea and vomiting. Vision changes. Severe chest pain, dizziness, and seizures. How is this diagnosed? This condition is diagnosed by  measuring your blood pressure while you are seated, with your arm resting on a flat surface, your legs uncrossed, and your feet flat on the floor. The cuff of the blood pressure monitor will be placed directly against the skin of your upper arm at the level of your heart. Blood pressure should be measured at least twice using the same arm. Certain conditions can cause a difference in blood pressure between your right and left arms. If you have a high blood pressure reading during one visit or you have normal blood pressure with other risk factors, you may be asked to: Return on a different day to have your blood pressure checked again. Monitor your blood pressure at home for 1 week or longer. If you are diagnosed with hypertension, you may have other blood or imaging tests to help your health care provider understand your overall risk for other conditions. How is this treated? This condition is treated by making healthy lifestyle changes, such as eating healthy foods, exercising more, and reducing your alcohol intake. You may be referred for counseling on a healthy diet and physical activity. Your health care provider may prescribe medicine if lifestyle changes are not enough to get your blood pressure under control and if: Your systolic blood pressure is above 130. Your diastolic blood pressure is above 80. Your personal target blood pressure may vary depending on your medical conditions, your age, and other factors. Follow these instructions at home: Eating and drinking  Eat a diet that is high in fiber and potassium, and low in sodium, added sugar, and fat. An example of this eating plan is called the DASH diet. DASH stands for Dietary Approaches to Stop Hypertension. To eat this way: Eat   plenty of fresh fruits and vegetables. Try to fill one half of your plate at each meal with fruits and vegetables. Eat whole grains, such as whole-wheat pasta, brown rice, or whole-grain bread. Fill about one  fourth of your plate with whole grains. Eat or drink low-fat dairy products, such as skim milk or low-fat yogurt. Avoid fatty cuts of meat, processed or cured meats, and poultry with skin. Fill about one fourth of your plate with lean proteins, such as fish, chicken without skin, beans, eggs, or tofu. Avoid pre-made and processed foods. These tend to be higher in sodium, added sugar, and fat. Reduce your daily sodium intake. Many people with hypertension should eat less than 1,500 mg of sodium a day. Do not drink alcohol if: Your health care provider tells you not to drink. You are pregnant, may be pregnant, or are planning to become pregnant. If you drink alcohol: Limit how much you have to: 0-1 drink a day for women. 0-2 drinks a day for men. Know how much alcohol is in your drink. In the [place] one drink equals one 12 oz bottle of beer (355 mL), one 5 oz glass of wine (148 mL), or one 1 oz glass of hard liquor (44 mL). Lifestyle  Work with your health care provider to maintain a healthy body weight or to lose weight. Ask what an ideal weight is for you. Get at least 30 minutes of exercise that causes your heart to beat faster (aerobic exercise) most days of the week. Activities may include walking, swimming, or biking. Include exercise to strengthen your muscles (resistance exercise), such as Pilates or lifting weights, as part of your weekly exercise routine. Try to do these types of exercises for 30 minutes at least 3 days a week. Do not use any products that contain nicotine or tobacco. These products include cigarettes, chewing tobacco, and vaping devices, such as e-cigarettes. If you need help quitting, ask your health care provider. Monitor your blood pressure at home as told by your health care provider. Keep all follow-up visits. This is important. Medicines Take over-the-counter and prescription medicines only as told by your health care provider. Follow directions carefully. Blood  pressure medicines must be taken as prescribed. Do not skip doses of blood pressure medicine. Doing this puts you at risk for problems and can make the medicine less effective. Ask your health care provider about side effects or reactions to medicines that you should watch for. Contact a health care provider if you: Think you are having a reaction to a medicine you are taking. Have headaches that keep coming back (recurring). Feel dizzy. Have swelling in your ankles. Have trouble with your vision. Get help right away if you: Develop a severe headache or confusion. Have unusual weakness or numbness. Feel faint. Have severe pain in your chest or abdomen. Vomit repeatedly. Have trouble breathing. These symptoms may be an emergency. Get help right away. Call 911. Do not wait to see if the symptoms will go away. Do not drive yourself to the hospital. Summary Hypertension is when the force of blood pumping through your arteries is too strong. If this condition is not controlled, it may put you at risk for serious complications. Your personal target blood pressure may vary depending on your medical conditions, your age, and other factors. For most people, a normal blood pressure is less than 120/80. Hypertension is treated with lifestyle changes, medicines, or a combination of both. Lifestyle changes include losing weight, eating a healthy,   low-sodium diet, exercising more, and limiting alcohol. This information is not intended to replace advice given to you by your health care provider. Make sure you discuss any questions you have with your health care provider. Document Revised: 01/31/2021 Document Reviewed: 01/31/2021 Elsevier Patient Education  2023 [employer] Inc[employer]  

## 2021-12-21 ENCOUNTER — Ambulatory Visit (INDEPENDENT_AMBULATORY_CARE_PROVIDER_SITE_OTHER): Payer: Medicare Other | Admitting: Family

## 2021-12-21 ENCOUNTER — Encounter: Payer: Self-pay | Admitting: Family

## 2021-12-21 VITALS — BP 136/69 | HR 89 | Temp 98.2°F | Ht 71.0 in | Wt 128.8 lb

## 2021-12-21 DIAGNOSIS — J42 Unspecified chronic bronchitis: Secondary | ICD-10-CM | POA: Diagnosis not present

## 2021-12-21 DIAGNOSIS — E559 Vitamin D deficiency, unspecified: Secondary | ICD-10-CM | POA: Diagnosis not present

## 2021-12-21 DIAGNOSIS — I10 Essential (primary) hypertension: Secondary | ICD-10-CM

## 2021-12-21 DIAGNOSIS — N401 Enlarged prostate with lower urinary tract symptoms: Secondary | ICD-10-CM | POA: Diagnosis not present

## 2021-12-21 DIAGNOSIS — E785 Hyperlipidemia, unspecified: Secondary | ICD-10-CM

## 2021-12-21 DIAGNOSIS — R350 Frequency of micturition: Secondary | ICD-10-CM | POA: Diagnosis not present

## 2021-12-21 DIAGNOSIS — C8599 Non-Hodgkin lymphoma, unspecified, extranodal and solid organ sites: Secondary | ICD-10-CM | POA: Diagnosis not present

## 2021-12-21 DIAGNOSIS — I700 Atherosclerosis of aorta: Secondary | ICD-10-CM | POA: Diagnosis not present

## 2021-12-21 DIAGNOSIS — R918 Other nonspecific abnormal finding of lung field: Secondary | ICD-10-CM | POA: Diagnosis not present

## 2021-12-21 NOTE — Patient Instructions (Signed)
Health Maintenance After Age 78 After age 78, you are at a higher risk for certain long-term diseases and infections as well as injuries from falls. Falls are a major cause of broken bones and head injuries in people who are older than age 78. Getting regular preventive care can help to keep you healthy and well. Preventive care includes getting regular testing and making lifestyle changes as recommended by your health care provider. Talk with your health care provider about: Which screenings and tests you should have. A screening is a test that checks for a disease when you have no symptoms. A diet and exercise plan that is right for you. What should I know about screenings and tests to prevent falls? Screening and testing are the best ways to find a health problem early. Early diagnosis and treatment give you the best chance of managing medical conditions that are common after age 78. Certain conditions and lifestyle choices may make you more likely to have a fall. Your health care provider may recommend: Regular vision checks. Poor vision and conditions such as cataracts can make you more likely to have a fall. If you wear glasses, make sure to get your prescription updated if your vision changes. Medicine review. Work with your health care provider to regularly review all of the medicines you are taking, including over-the-counter medicines. Ask your health care provider about any side effects that may make you more likely to have a fall. Tell your health care provider if any medicines that you take make you feel dizzy or sleepy. Strength and balance checks. Your health care provider may recommend certain tests to check your strength and balance while standing, walking, or changing positions. Foot health exam. Foot pain and numbness, as well as not wearing proper footwear, can make you more likely to have a fall. Screenings, including: Osteoporosis screening. Osteoporosis is a condition that causes  the bones to get weaker and break more easily. Blood pressure screening. Blood pressure changes and medicines to control blood pressure can make you feel dizzy. Depression screening. You may be more likely to have a fall if you have a fear of falling, feel depressed, or feel unable to do activities that you used to do. Alcohol use screening. Using too much alcohol can affect your balance and may make you more likely to have a fall. Follow these instructions at home: Lifestyle Do not drink alcohol if: Your health care provider tells you not to drink. If you drink alcohol: Limit how much you have to: 0-1 drink a day for women. 0-2 drinks a day for men. Know how much alcohol is in your drink. In the [place] one drink equals one 12 oz bottle of beer (355 mL), one 5 oz glass of wine (148 mL), or one 1 oz glass of hard liquor (44 mL). Do not use any products that contain nicotine or tobacco. These products include cigarettes, chewing tobacco, and vaping devices, such as e-cigarettes. If you need help quitting, ask your health care provider. Activity  Follow a regular exercise program to stay fit. This will help you maintain your balance. Ask your health care provider what types of exercise are appropriate for you. If you need a cane or walker, use it as recommended by your health care provider. Wear supportive shoes that have nonskid soles. Safety  Remove any tripping hazards, such as rugs, cords, and clutter. Install safety equipment such as grab bars in bathrooms and safety rails on stairs. Keep rooms and walkways   well-lit. General instructions Talk with your health care provider about your risks for falling. Tell your health care provider if: You fall. Be sure to tell your health care provider about all falls, even ones that seem minor. You feel dizzy, tiredness (fatigue), or off-balance. Take over-the-counter and prescription medicines only as told by your health care provider. These include  supplements. Eat a healthy diet and maintain a healthy weight. A healthy diet includes low-fat dairy products, low-fat (lean) meats, and fiber from whole grains, beans, and lots of fruits and vegetables. Stay current with your vaccines. Schedule regular health, dental, and eye exams. Summary Having a healthy lifestyle and getting preventive care can help to protect your health and wellness after age 78. Screening and testing are the best way to find a health problem early and help you avoid having a fall. Early diagnosis and treatment give you the best chance for managing medical conditions that are more common for people who are older than age 78. Falls are a major cause of broken bones and head injuries in people who are older than age 78. Take precautions to prevent a fall at home. Work with your health care provider to learn what changes you can make to improve your health and wellness and to prevent falls. This information is not intended to replace advice given to you by your health care provider. Make sure you discuss any questions you have with your health care provider. Document Revised: 08/15/2020 Document Reviewed: 08/15/2020 Elsevier Patient Education  2023 [employer]  

## 2021-12-21 NOTE — Progress Notes (Signed)
Subjective:    Patient ID: Kenneth Reed, male    DOB: 02-19-1946, 76 y.o.   MRN: [id number]  Chief Complaint  Patient presents with   Medical Management of Chronic Issues    4 month followup   Pt presents to the office today for chronic follow up. He is followed by the [facility] annually. He is followed by Urologists annually for BPH and hx bladder cancer. He is followed by ENT as needed. He is followed by   Pulmonologist annually for COPD and lung nodules. 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  He reports his breathing is stable and taking Advair BID.    He has aortic atherosclerosis and takes Crestor daily.  Her BP is at goal today.  Benign Prostatic Hypertrophy This is a chronic problem. The current episode started more than 1 year ago. Irritative symptoms include nocturia (3). Pertinent negatives include no hesitancy or nausea. Past treatments include tamsulosin. The treatment provided mild relief.  Hyperlipidemia This is a chronic problem. The current episode started more than 1 year ago. The problem is controlled. Recent lipid tests were reviewed and are normal. Associated symptoms include shortness of breath. Current antihyperlipidemic treatment includes statins. The current treatment provides moderate improvement of lipids. Risk factors for coronary artery disease include dyslipidemia, male sex, hypertension and a sedentary lifestyle.  Hypertension This is a chronic problem. The current episode started more than 1 year ago. The problem has been resolved since onset. The problem is controlled. Associated symptoms include shortness of breath. Pertinent negatives include no malaise/fatigue or peripheral edema. Past treatments include ACE inhibitors. The current treatment provides moderate improvement.      Review of Systems  Constitutional:  Negative for malaise/fatigue.  Respiratory:  Positive for shortness of breath.   Gastrointestinal:  Negative for nausea.   Genitourinary:  Positive for nocturia (3). Negative for hesitancy.  All other systems reviewed and are negative.      Objective:   Physical Exam Vitals reviewed.  Constitutional:      General: He is not in acute distress.    Appearance: He is well-developed.  HENT:     Head: Normocephalic.     Right Ear: Tympanic membrane normal.     Left Ear: Tympanic membrane normal.  Eyes:     General:        Right eye: No discharge.        Left eye: No discharge.     Pupils: Pupils are equal, round, and reactive to light.  Neck:     Thyroid: No thyromegaly.  Cardiovascular:     Rate and Rhythm: Normal rate and regular rhythm.     Heart sounds: Normal heart sounds. No murmur heard. Pulmonary:     Effort: Pulmonary effort is normal. No respiratory distress.     Breath sounds: Normal breath sounds. No wheezing.  Abdominal:     General: Bowel sounds are normal. There is no distension.     Palpations: Abdomen is soft.     Tenderness: There is no abdominal tenderness.  Musculoskeletal:        General: No tenderness. Normal range of motion.     Cervical back: Normal range of motion and neck supple.  Skin:    General: Skin is warm and dry.     Findings: No erythema or rash.  Neurological:     Mental Status: He is alert and oriented to person, place, and time.     Cranial Nerves: No cranial  nerve deficit.     Deep Tendon Reflexes: Reflexes are normal and symmetric.  Psychiatric:        Behavior: Behavior normal.        Thought Content: Thought content normal.        Judgment: Judgment normal.         BP 136/69   Pulse 89   Temp 98.2 F (36.8 C)   Ht '5\' 11"'  (1.803 m)   Wt 128 lb 12.8 oz (58.4 kg)   SpO2 100%   BMI 17.96 kg/m    Assessment & Plan:  Kenneth Reed comes in today with chief complaint of Medical Management of Chronic Issues (4 month followup)   Diagnosis and orders addressed:  1. Aortic atherosclerosis (HCC) - CMP14+EGFR - CBC with  Differential/Platelet  2. Non-Hodgkin's lymphoma of tonsil ([place]) - CMP14+EGFR - CBC with Differential/Platelet  3. Vitamin D deficiency - CMP14+EGFR - CBC with Differential/Platelet - VITAMIN D 25 Hydroxy (Vit-D Deficiency, Fractures)  4. Benign prostatic hyperplasia with urinary frequency - CMP14+EGFR - CBC with Differential/Platelet  5. Chronic bronchitis, unspecified chronic bronchitis type ([place]) - CMP14+EGFR - CBC with Differential/Platelet  6. Multiple lung nodules on CT - CMP14+EGFR - CBC with Differential/Platelet  7. Hyperlipidemia, unspecified hyperlipidemia type - CMP14+EGFR - CBC with Differential/Platelet - Lipid panel  8. Primary hypertension At goal    Labs pending Health Maintenance reviewed Diet and exercise encouraged  Follow up plan: 6 months    Evelina Dun, FNP

## 2021-12-22 LAB — CBC WITH DIFFERENTIAL/PLATELET
Basophils Absolute: 0 10*3/uL (ref 0.0–0.2)
Basos: 1 %
EOS (ABSOLUTE): 0.1 10*3/uL (ref 0.0–0.4)
Eos: 3 %
Hematocrit: 40.9 % (ref 37.5–51.0)
Hemoglobin: 14 g/dL (ref 13.0–17.7)
Immature Grans (Abs): 0 10*3/uL (ref 0.0–0.1)
Immature Granulocytes: 0 %
Lymphocytes Absolute: 0.7 10*3/uL (ref 0.7–3.1)
Lymphs: 24 %
MCH: 32.4 pg (ref 26.6–33.0)
MCHC: 34.2 g/dL (ref 31.5–35.7)
MCV: 95 fL (ref 79–97)
Monocytes Absolute: 0.2 10*3/uL (ref 0.1–0.9)
Monocytes: 7 %
Neutrophils Absolute: 1.9 10*3/uL (ref 1.4–7.0)
Neutrophils: 65 %
Platelets: 224 10*3/uL (ref 150–450)
RBC: 4.32 x10E6/uL (ref 4.14–5.80)
RDW: 12.4 % (ref 11.6–15.4)
WBC: 2.9 10*3/uL — ABNORMAL LOW (ref 3.4–10.8)

## 2021-12-22 LAB — CMP14+EGFR
ALT: 10 IU/L (ref 0–44)
AST: 14 IU/L (ref 0–40)
Albumin/Globulin Ratio: 2.1 (ref 1.2–2.2)
Albumin: 4.5 g/dL (ref 3.8–4.8)
Alkaline Phosphatase: 56 IU/L (ref 44–121)
BUN/Creatinine Ratio: 10 (ref 10–24)
BUN: 9 mg/dL (ref 8–27)
Bilirubin Total: 0.7 mg/dL (ref 0.0–1.2)
CO2: 24 mmol/L (ref 20–29)
Calcium: 9.4 mg/dL (ref 8.6–10.2)
Chloride: 97 mmol/L (ref 96–106)
Creatinine, Ser: 0.94 mg/dL (ref 0.76–1.27)
Globulin, Total: 2.1 g/dL (ref 1.5–4.5)
Glucose: 146 mg/dL — ABNORMAL HIGH (ref 70–99)
Potassium: 4.5 mmol/L (ref 3.5–5.2)
Sodium: 135 mmol/L (ref 134–144)
Total Protein: 6.6 g/dL (ref 6.0–8.5)
eGFR: 83 mL/min/{1.73_m2} (ref >59)

## 2021-12-22 LAB — LIPID PANEL
Chol/HDL Ratio: 1.9 ratio (ref 0.0–5.0)
Cholesterol, Total: 124 mg/dL (ref 100–199)
HDL: 64 mg/dL (ref >39)
LDL Chol Calc (NIH): 47 mg/dL (ref 0–99)
Triglycerides: 57 mg/dL (ref 0–149)
VLDL Cholesterol Cal: 13 mg/dL (ref 5–40)

## 2021-12-22 LAB — VITAMIN D 25 HYDROXY (VIT D DEFICIENCY, FRACTURES): Vit D, 25-Hydroxy: 33 ng/mL (ref 30.0–100.0)

## 2021-12-26 ENCOUNTER — Other Ambulatory Visit: Payer: Self-pay | Admitting: Family

## 2021-12-26 DIAGNOSIS — C8599 Non-Hodgkin lymphoma, unspecified, extranodal and solid organ sites: Secondary | ICD-10-CM

## 2021-12-26 DIAGNOSIS — D72819 Decreased white blood cell count, unspecified: Secondary | ICD-10-CM

## 2021-12-29 ENCOUNTER — Telehealth: Payer: Self-pay | Admitting: Hematology and Oncology

## 2021-12-29 NOTE — Telephone Encounter (Signed)
Scheduled appt per 9/19 referral. Pt's daughter is aware of appt date and time. Pt's daughter is aware to arrive 15 mins prior to appt time and to bring and updated insurance card. Pt's daughter is aware of appt location.

## 2022-01-22 ENCOUNTER — Encounter: Payer: Medicare Other | Admitting: Hematology and Oncology

## 2022-01-22 DIAGNOSIS — Z8572 Personal history of non-Hodgkin lymphomas: Secondary | ICD-10-CM | POA: Diagnosis not present

## 2022-01-22 DIAGNOSIS — R911 Solitary pulmonary nodule: Secondary | ICD-10-CM | POA: Diagnosis not present

## 2022-01-22 DIAGNOSIS — Z85038 Personal history of other malignant neoplasm of large intestine: Secondary | ICD-10-CM | POA: Diagnosis not present

## 2022-01-22 DIAGNOSIS — J449 Chronic obstructive pulmonary disease, unspecified: Secondary | ICD-10-CM | POA: Diagnosis not present

## 2022-01-22 DIAGNOSIS — Z8551 Personal history of malignant neoplasm of bladder: Secondary | ICD-10-CM | POA: Diagnosis not present

## 2022-01-29 ENCOUNTER — Inpatient Hospital Stay: Payer: Medicare Other | Attending: Hematology and Oncology | Admitting: Hematology and Oncology

## 2022-01-29 ENCOUNTER — Encounter: Payer: Self-pay | Admitting: Hematology and Oncology

## 2022-01-29 ENCOUNTER — Other Ambulatory Visit: Payer: Self-pay

## 2022-01-29 VITALS — BP 134/75 | HR 85 | Temp 97.4°F | Resp 18 | Ht 71.0 in | Wt 130.0 lb

## 2022-01-29 DIAGNOSIS — Z87891 Personal history of nicotine dependence: Secondary | ICD-10-CM | POA: Insufficient documentation

## 2022-01-29 DIAGNOSIS — C8599 Non-Hodgkin lymphoma, unspecified, extranodal and solid organ sites: Secondary | ICD-10-CM | POA: Diagnosis not present

## 2022-01-29 DIAGNOSIS — Z85038 Personal history of other malignant neoplasm of large intestine: Secondary | ICD-10-CM | POA: Diagnosis not present

## 2022-01-29 DIAGNOSIS — Z8551 Personal history of malignant neoplasm of bladder: Secondary | ICD-10-CM | POA: Diagnosis not present

## 2022-01-29 DIAGNOSIS — Z806 Family history of leukemia: Secondary | ICD-10-CM | POA: Diagnosis not present

## 2022-01-29 DIAGNOSIS — D72819 Decreased white blood cell count, unspecified: Secondary | ICD-10-CM | POA: Insufficient documentation

## 2022-01-29 DIAGNOSIS — Z8572 Personal history of non-Hodgkin lymphomas: Secondary | ICD-10-CM | POA: Insufficient documentation

## 2022-01-29 NOTE — Assessment & Plan Note (Signed)
His recent CT imaging from 2022 show no evidence of disease recurrence

## 2022-01-29 NOTE — Assessment & Plan Note (Signed)
The patient had early stage lymphoma that is cured There is no role for surveillance imaging Examination is benign

## 2022-01-29 NOTE — Assessment & Plan Note (Signed)
He had recent leukopenia, could be due to his chronic alcohol use and recent infection In order to get the most accurate picture, he needs to stop drinking for at least 10 days before repeat blood work The patient is reliant on his daughter for transportation He will discuss this with his daughter and will call me back to schedule return lab visit in follow-up

## 2022-01-29 NOTE — Progress Notes (Signed)
[facility] progress notes  Patient Care Team: Sharion Balloon, FNP as PCP - General (Nurse Practitioner) Melida Quitter, MD as Attending Physician (Otolaryngology) Miguel Dibble, MD as Attending Physician (Urology) Sadie Haber, MD as Referring Physician (Urology) Burke Keels, MD as Attending Physician (Surgery) Eloise Harman, DO as Consulting Physician (Gastroenterology)  CHIEF COMPLAINTS/PURPOSE OF VISIT:  History of multiple cancer including colon cancer, bladder cancer, lymphoma of the tonsil, recent leukopenia  HISTORY OF PRESENTING ILLNESS:  Kenneth Reed 76 y.o. male was last seen in 2019. He was discharged from the clinic due to absence of disease and remain in remission He is being referred back here due to recent leukopenia He denies recent weight loss.  The patient has been drinking on a regular basis at least 2 beer per day He had recent dental infection and has completed a course of antibiotics He has recent postnasal drip No new lymphadenopathy Denies recent urinary difficulties  I reviewed the patient's records extensive and collaborated the history with the patient. Summary of his history is as follows: Oncology History  Non-Hodgkin's lymphoma of tonsil ([place])  07/30/2016 Imaging   Ct chest: 1. No evidence of active lymphoma or thoracic metastasis. 2. Similar left apical ground-glass nodule. Per consensus criteria, this warrants follow-up chest CT at 2 years. 3. Upper pole left renal lesion is favored to represent a complex cyst, given presence back to 2014. Recommend attention on follow-up. 4.  Aortic atherosclerosis.   08/13/2017 Imaging   1. Stable CT chest.  No findings to suggest metastatic disease. 2. Stable 12 mm left upper lobe ground-glass nodule. Repeat CT is recommended every 2 years until 5 years of stability has been established. This recommendation follows the consensus statement: Guidelines for Management of Incidental  Pulmonary Nodules Detected on CT Images: From the Fleischner Society 2017; Radiology 2017; 284:228-243. 3. No evidence for lymphadenopathy or metastatic disease in the abdomen/pelvis. 4.  Aortic Atherosclerois (ICD10-170.0) 5. Prostatomegaly.       MEDICAL HISTORY:  Past Medical History:  Diagnosis Date   Bladder cancer [facility]) 2010   Dr. [place] Pink operated; no need for chemo; recurred locally  in 2013 locally.     BPH (benign prostatic hyperplasia)    Cancer ([place]) 03/26/12   High Grade NonHodgkin's B- Cell Lymphoma   Colon cancer (HCC) 2010   Dr. Anthony Sar operated; stage II.  No need for chemo.   COPD (chronic obstructive pulmonary disease) ([place])    Fatigue    History of colon cancer 01/07/2013   HOH (hard of hearing)    Kidney stone    Non-Hodgkin's lymphoma of head ([place]) 2014   Status post chemotherapy 04/18/2012 - 06/20/12    4 Cycles of R-CHOP with 3 Doses of Prophylactic Intrathecal chemo.   Weight loss 07/11/12 note    SURGICAL HISTORY: Past Surgical History:  Procedure Laterality Date   Biospy Left Tonsil  03/26/12   Non - Hodgkins B-Cell Lympyhoma   BLADDER SURGERY  2010   CATARACT EXTRACTION W/PHACO Right 03/30/2020   Procedure: CATARACT EXTRACTION PHACO AND INTRAOCULAR LENS PLACEMENT ([place]);  Surgeon: Baruch Goldmann, MD;  Location: [facility];  Service: Ophthalmology;  Laterality: Right;  CDE: 15.63   CATARACT EXTRACTION W/PHACO Left 04/15/2020   Procedure: CATARACT EXTRACTION PHACO AND INTRAOCULAR LENS PLACEMENT LEFT EYE;  Surgeon: Baruch Goldmann, MD;  Location: [facility];  Service: Ophthalmology;  Laterality: Left;  CDE 10.20   COLECTOMY  2010   COLONOSCOPY  2012  next one 2014.    CYSTOSCOPY W/ RETROGRADES Bilateral 09/09/2019   Procedure: CYSTOSCOPY WITH RETROGRADE PYELOGRAM;  Surgeon: Cleon Gustin, MD;  Location: [facility];  Service: Urology;  Laterality: Bilateral;   HAND TENDON SURGERY Bilateral    NASAL POLYP SURGERY  1983   TRANSURETHRAL RESECTION OF BLADDER TUMOR  N/A 09/09/2019   Procedure: TRANSURETHRAL RESECTION OF BLADDER TUMOR (TURBT);  Surgeon: Cleon Gustin, MD;  Location: [facility];  Service: Urology;  Laterality: N/A;    SOCIAL HISTORY: Social History   Socioeconomic History   Marital status: Single    Spouse name: Not on file   Number of children: 2   Years of education: GED   Highest education level: GED or equivalent  Occupational History   Occupation: Retired    Fish farm manager: [employer]    Comment: textile   Tobacco Use   Smoking status: Never   Smokeless tobacco: Former    Types: Chew    Quit date: 2010  Vaping Use   Vaping Use: Never used  Substance and Sexual Activity   Alcohol use: Yes    Alcohol/week: 2.0 standard drinks of alcohol    Types: 2 Cans of beer per week    Comment: few beers here and there, nothing regular   Drug use: No   Sexual activity: Not Currently  Other Topics Concern   Not on file  Social History Narrative   Lives with his daughter   Not very sociable - likes to stay home   Social Determinants of Health   Financial Resource Strain: Low Risk  (03/01/2021)   Overall Financial Resource Strain (CARDIA)    Difficulty of Paying Living Expenses: Not hard at all  Food Insecurity: No Food Insecurity (03/01/2021)   Hunger Vital Sign    Worried About Running Out of Food in the Last Year: Never true    Ran Out of Food in the Last Year: Never true  Transportation Needs: No Transportation Needs (03/01/2021)   PRAPARE - Hydrologist (Medical): No    Lack of Transportation (Non-Medical): No  Physical Activity: Sufficiently Active (03/01/2021)   Exercise Vital Sign    Days of Exercise per Week: 7 days    Minutes of Exercise per Session: 30 min  Stress: No Stress Concern Present (03/01/2021)   [place]    Feeling of Stress : Only a little  Social Connections: Socially Isolated (03/01/2021)   Social Connection  and Isolation Panel [NHANES]    Frequency of Communication with Friends and Family: Twice a week    Frequency of Social Gatherings with Friends and Family: Twice a week    Attends Religious Services: Never    Marine scientist or Organizations: No    Attends Archivist Meetings: Never    Marital Status: Divorced  Human resources officer Violence: Not At Risk (03/01/2021)   Humiliation, Afraid, Rape, and Kick questionnaire    Fear of Current or Ex-Partner: No    Emotionally Abused: No    Physically Abused: No    Sexually Abused: No    FAMILY HISTORY: Family History  Problem Relation Age of Onset   Cancer Father        leukemia   Cancer Maternal Aunt        leukemia   Cancer Paternal Grandmother        leukemia   Narcolepsy Daughter     ALLERGIES:  has No  Known Allergies.  MEDICATIONS:  Current Outpatient Medications  Medication Sig Dispense Refill   acetaminophen (TYLENOL) 500 MG tablet Take 1,000 mg by mouth every 6 (six) hours as needed for moderate pain.      albuterol (VENTOLIN HFA) 108 (90 Base) MCG/ACT inhaler Inhale 2 puffs into the lungs every 4 (four) hours as needed. 1 each 2   aspirin EC 81 MG tablet Take 1 tablet (81 mg total) by mouth daily. Swallow whole. 90 tablet 4   cetirizine (ZYRTEC) 10 MG tablet Take 1 tablet (10 mg total) by mouth daily. 90 tablet 2   fluticasone (FLONASE) 50 MCG/ACT nasal spray Place 1 spray into both nostrils 2 (two) times daily as needed for allergies or rhinitis. 16 g 6   fluticasone-salmeterol (ADVAIR HFA) 230-21 MCG/ACT inhaler Inhale 2 puffs into the lungs 2 (two) times daily. 3 each 4   lisinopril (ZESTRIL) 20 MG tablet Take 1 tablet (20 mg total) by mouth daily. 90 tablet 3   rosuvastatin (CRESTOR) 10 MG tablet Take 1 tablet by mouth once daily 90 tablet 1   tamsulosin (FLOMAX) 0.4 MG CAPS capsule Take 1 capsule (0.4 mg total) by mouth in the morning and at bedtime. 60 capsule 11   No current facility-administered  medications for this visit.    REVIEW OF SYSTEMS:   Constitutional: Denies fevers, chills or abnormal night sweats Eyes: Denies blurriness of vision, double vision or watery eyes Ears, nose, mouth, throat, and face: Denies mucositis or sore throat Respiratory: Denies cough, dyspnea or wheezes Cardiovascular: Denies palpitation, chest discomfort or lower extremity swelling Gastrointestinal:  Denies nausea, heartburn or change in bowel habits Skin: Denies abnormal skin rashes Lymphatics: Denies new lymphadenopathy or easy bruising Neurological:Denies numbness, tingling or new weaknesses Behavioral/Psych: Mood is stable, no new changes  All other systems were reviewed with the patient and are negative.  PHYSICAL EXAMINATION: ECOG PERFORMANCE STATUS: 1 - Symptomatic but completely ambulatory  Vitals:   01/29/22 0856  BP: 134/75  Pulse: 85  Resp: 18  Temp: (!) 97.4 F (36.3 C)  SpO2: 98%   Filed Weights   01/29/22 0856  Weight: 130 lb (59 kg)    GENERAL:alert, no distress and comfortable.  He looks thin and cachectic SKIN: skin color, texture, turgor are normal, no rashes or significant lesions EYES: normal, conjunctiva are pink and non-injected, sclera clear OROPHARYNX:no exudate, normal lips, buccal mucosa, and tongue  NECK: supple, thyroid normal size, non-tender, without nodularity LYMPH:  no palpable lymphadenopathy in the cervical, axillary or inguinal LUNGS: clear to auscultation and percussion with normal breathing effort HEART: regular rate & rhythm and no murmurs without lower extremity edema ABDOMEN:abdomen soft, non-tender and normal bowel sounds Musculoskeletal:no cyanosis of digits and no clubbing  PSYCH: alert & oriented x 3 with fluent speech NEURO: no focal motor/sensory deficits  LABORATORY DATA:  I have reviewed the data as listed Lab Results  Component Value Date   WBC 2.9 (L) 12/21/2021   HGB 14.0 12/21/2021   HCT 40.9 12/21/2021   MCV 95  12/21/2021   PLT 224 12/21/2021   Recent Labs    05/18/21 0953 11/14/21 1011 12/21/21 0829  NA 141 139 135  K 4.4 4.4 4.5  CL 104 101 97  CO2 '24 22 24  '$ GLUCOSE 91 107* 146*  BUN '10 8 9  '$ CREATININE 0.92 0.89 0.94  CALCIUM 9.1 9.3 9.4  PROT 6.8 6.7 6.6  ALBUMIN 4.9* 4.7 4.5  AST '25 19 14  '$ ALT 18  15 10  ALKPHOS 60 57 13  BILITOT 0.9 0.8 0.7    ASSESSMENT & PLAN:  Non-Hodgkin's lymphoma of tonsil The patient had early stage lymphoma that is cured There is no role for surveillance imaging Examination is benign  Leukopenia He had recent leukopenia, could be due to his chronic alcohol use and recent infection In order to get the most accurate picture, he needs to stop drinking for at least 10 days before repeat blood work The patient is reliant on his daughter for transportation He will discuss this with his daughter and will call me back to schedule return lab visit in follow-up  History of colon cancer His recent CT imaging from 2022 show no evidence of disease recurrence  History of bladder cancer His recent CT imaging from 2022 showed no evidence of active disease  Orders Placed This Encounter  Procedures   CBC with Differential ([place] Only)    Standing Status:   Future    Standing Expiration Date:   01/30/2023   Vitamin B12    Standing Status:   Future    Standing Expiration Date:   01/30/2023   Sedimentation rate    Standing Status:   Future    Standing Expiration Date:   01/30/2023    All questions were answered. The patient knows to call the clinic with any problems, questions or concerns. The total time spent in the appointment was 55 minutes encounter with patients including review of chart and various tests results, discussions about plan of care and coordination of care plan   Heath Lark, MD 01/29/2022 9:19 AM

## 2022-01-29 NOTE — Assessment & Plan Note (Signed)
His recent CT imaging from 2022 showed no evidence of active disease

## 2022-02-09 ENCOUNTER — Telehealth: Payer: Self-pay

## 2022-02-09 ENCOUNTER — Encounter: Payer: Self-pay | Admitting: Hematology and Oncology

## 2022-02-09 ENCOUNTER — Inpatient Hospital Stay: Payer: Medicare Other | Attending: Hematology and Oncology | Admitting: Hematology and Oncology

## 2022-02-09 ENCOUNTER — Inpatient Hospital Stay: Payer: Medicare Other

## 2022-02-09 VITALS — BP 142/76 | HR 81 | Temp 97.9°F | Resp 18 | Ht 71.0 in | Wt 129.8 lb

## 2022-02-09 DIAGNOSIS — Z7982 Long term (current) use of aspirin: Secondary | ICD-10-CM | POA: Diagnosis not present

## 2022-02-09 DIAGNOSIS — R911 Solitary pulmonary nodule: Secondary | ICD-10-CM | POA: Insufficient documentation

## 2022-02-09 DIAGNOSIS — N400 Enlarged prostate without lower urinary tract symptoms: Secondary | ICD-10-CM | POA: Diagnosis not present

## 2022-02-09 DIAGNOSIS — C8599 Non-Hodgkin lymphoma, unspecified, extranodal and solid organ sites: Secondary | ICD-10-CM

## 2022-02-09 DIAGNOSIS — Z7951 Long term (current) use of inhaled steroids: Secondary | ICD-10-CM | POA: Diagnosis not present

## 2022-02-09 DIAGNOSIS — D72819 Decreased white blood cell count, unspecified: Secondary | ICD-10-CM | POA: Diagnosis not present

## 2022-02-09 DIAGNOSIS — Z79899 Other long term (current) drug therapy: Secondary | ICD-10-CM | POA: Diagnosis not present

## 2022-02-09 LAB — CBC WITH DIFFERENTIAL (CANCER CENTER ONLY)
Abs Immature Granulocytes: 0 10*3/uL (ref 0.00–0.07)
Basophils Absolute: 0 10*3/uL (ref 0.0–0.1)
Basophils Relative: 1 %
Eosinophils Absolute: 0 10*3/uL (ref 0.0–0.5)
Eosinophils Relative: 1 %
HCT: 43.9 % (ref 39.0–52.0)
Hemoglobin: 15.3 g/dL (ref 13.0–17.0)
Immature Granulocytes: 0 %
Lymphocytes Relative: 23 %
Lymphs Abs: 0.7 10*3/uL (ref 0.7–4.0)
MCH: 32.4 pg (ref 26.0–34.0)
MCHC: 34.9 g/dL (ref 30.0–36.0)
MCV: 93 fL (ref 80.0–100.0)
Monocytes Absolute: 0.2 10*3/uL (ref 0.1–1.0)
Monocytes Relative: 7 %
Neutro Abs: 2.1 10*3/uL (ref 1.7–7.7)
Neutrophils Relative %: 68 %
Platelet Count: 201 10*3/uL (ref 150–400)
RBC: 4.72 MIL/uL (ref 4.22–5.81)
RDW: 12.2 % (ref 11.5–15.5)
WBC Count: 3.1 10*3/uL — ABNORMAL LOW (ref 4.0–10.5)
nRBC: 0 % (ref 0.0–0.2)

## 2022-02-09 LAB — VITAMIN B12: Vitamin B-12: 623 pg/mL (ref 180–914)

## 2022-02-09 LAB — SEDIMENTATION RATE: Sed Rate: 3 mm/hr (ref 0–16)

## 2022-02-09 NOTE — Progress Notes (Signed)
[facility] OFFICE PROGRESS NOTE  Patient Care Team: Sharion Balloon, FNP as PCP - General (Nurse Practitioner) Melida Quitter, MD as Attending Physician (Otolaryngology) Miguel Dibble, MD as Attending Physician (Urology) Sadie Haber, MD as Referring Physician (Urology) Burke Keels, MD as Attending Physician (Surgery) Eloise Harman, DO as Consulting Physician (Gastroenterology)  ASSESSMENT & PLAN:  Leukopenia The cause is unknown, could be due to alcoholism and prior exposure to chemotherapy He is not symptomatic B12 level is pending Recommend repeat blood work again in 6 months We discussed importance of avoiding alcohol intake for 7 to 10 days prior to his blood work We discussed the risk and benefits of doing a bone marrow biopsy but he declines  Orders Placed This Encounter  Procedures   CBC with Differential ([facility] Only)    Standing Status:   Future    Standing Expiration Date:   02/10/2023    All questions were answered. The patient knows to call the clinic with any problems, questions or concerns. The total time spent in the appointment was 20 minutes encounter with patients including review of chart and various tests results, discussions about plan of care and coordination of care plan   Heath Lark, MD 02/09/2022 10:41 AM  INTERVAL HISTORY: Please see below for problem oriented charting. he returns for follow-up on recent visit for evaluation of leukopenia, on the background history of multiple malignancies He felt fine He stated he has not drink 7 to 10 days prior to blood work today Denies recent infection He is not up-to-date with his vaccination and we reviewed vaccination program  REVIEW OF SYSTEMS:   Constitutional: Denies fevers, chills or abnormal weight loss Eyes: Denies blurriness of vision Ears, nose, mouth, throat, and face: Denies mucositis or sore throat Respiratory: Denies cough, dyspnea or wheezes Cardiovascular:  Denies palpitation, chest discomfort or lower extremity swelling Gastrointestinal:  Denies nausea, heartburn or change in bowel habits Skin: Denies abnormal skin rashes Lymphatics: Denies new lymphadenopathy or easy bruising Neurological:Denies numbness, tingling or new weaknesses Behavioral/Psych: Mood is stable, no new changes  All other systems were reviewed with the patient and are negative.  I have reviewed the past medical history, past surgical history, social history and family history with the patient and they are unchanged from previous note.  ALLERGIES:  has No Known Allergies.  MEDICATIONS:  Current Outpatient Medications  Medication Sig Dispense Refill   acetaminophen (TYLENOL) 500 MG tablet Take 1,000 mg by mouth every 6 (six) hours as needed for moderate pain.      albuterol (VENTOLIN HFA) 108 (90 Base) MCG/ACT inhaler Inhale 2 puffs into the lungs every 4 (four) hours as needed. 1 each 2   aspirin EC 81 MG tablet Take 1 tablet (81 mg total) by mouth daily. Swallow whole. 90 tablet 4   cetirizine (ZYRTEC) 10 MG tablet Take 1 tablet (10 mg total) by mouth daily. 90 tablet 2   fluticasone (FLONASE) 50 MCG/ACT nasal spray Place 1 spray into both nostrils 2 (two) times daily as needed for allergies or rhinitis. 16 g 6   fluticasone-salmeterol (ADVAIR HFA) 230-21 MCG/ACT inhaler Inhale 2 puffs into the lungs 2 (two) times daily. 3 each 4   lisinopril (ZESTRIL) 20 MG tablet Take 1 tablet (20 mg total) by mouth daily. 90 tablet 3   rosuvastatin (CRESTOR) 10 MG tablet Take 1 tablet by mouth once daily 90 tablet 1   tamsulosin (FLOMAX) 0.4 MG CAPS capsule Take 1 capsule (0.4  mg total) by mouth in the morning and at bedtime. 60 capsule 11   No current facility-administered medications for this visit.    SUMMARY OF ONCOLOGIC HISTORY: Oncology History  Non-Hodgkin's lymphoma of tonsil ([place])  07/30/2016 Imaging   Ct chest: 1. No evidence of active lymphoma or thoracic metastasis. 2.  Similar left apical ground-glass nodule. Per consensus criteria, this warrants follow-up chest CT at 2 years. 3. Upper pole left renal lesion is favored to represent a complex cyst, given presence back to 2014. Recommend attention on follow-up. 4.  Aortic atherosclerosis.   08/13/2017 Imaging   1. Stable CT chest.  No findings to suggest metastatic disease. 2. Stable 12 mm left upper lobe ground-glass nodule. Repeat CT is recommended every 2 years until 5 years of stability has been established. This recommendation follows the consensus statement: Guidelines for Management of Incidental Pulmonary Nodules Detected on CT Images: From the Fleischner Society 2017; Radiology 2017; 284:228-243. 3. No evidence for lymphadenopathy or metastatic disease in the abdomen/pelvis. 4.  Aortic Atherosclerois (ICD10-170.0) 5. Prostatomegaly.       PHYSICAL EXAMINATION: ECOG PERFORMANCE STATUS: 0 - Asymptomatic  Vitals:   02/09/22 1016  BP: (!) 142/76  Pulse: 81  Resp: 18  Temp: 97.9 F (36.6 C)  SpO2: 100%   Filed Weights   02/09/22 1016  Weight: 129 lb 12.8 oz (58.9 kg)    GENERAL:alert, no distress and comfortable NEURO: alert & oriented x 3 with fluent speech, no focal motor/sensory deficits  LABORATORY DATA:  I have reviewed the data as listed    Component Value Date/Time   NA 135 12/21/2021 0829   NA 140 07/30/2016 0952   K 4.5 12/21/2021 0829   K 4.2 07/30/2016 0952   CL 97 12/21/2021 0829   CL 107 07/11/2012 0817   CO2 24 12/21/2021 0829   CO2 25 07/30/2016 0952   GLUCOSE 146 (H) 12/21/2021 0829   GLUCOSE 118 (H) 12/03/2020 0948   GLUCOSE 99 07/30/2016 0952   GLUCOSE 96 07/11/2012 0817   BUN 9 12/21/2021 0829   BUN 15.2 07/30/2016 0952   CREATININE 0.94 12/21/2021 0829   CREATININE 0.97 07/19/2017 1357   CREATININE 1.0 07/30/2016 0952   CALCIUM 9.4 12/21/2021 0829   CALCIUM 9.3 07/30/2016 0952   PROT 6.6 12/21/2021 0829   PROT 7.2 07/30/2016 0952   ALBUMIN 4.5 12/21/2021  0829   ALBUMIN 4.2 07/30/2016 0952   AST 14 12/21/2021 0829   AST 18 07/19/2017 1357   AST 19 07/30/2016 0952   ALT 10 12/21/2021 0829   ALT 13 07/19/2017 1357   ALT 13 07/30/2016 0952   ALKPHOS 56 12/21/2021 0829   ALKPHOS 61 07/30/2016 0952   BILITOT 0.7 12/21/2021 0829   BILITOT 0.5 07/19/2017 1357   BILITOT 1.01 07/30/2016 0952   GFRNONAA >60 12/03/2020 0948   GFRNONAA >60 07/19/2017 1357   GFRAA 80 05/18/2020 0933   GFRAA >60 07/19/2017 1357    No results found for: "SPEP", "UPEP"  Lab Results  Component Value Date   WBC 3.1 (L) 02/09/2022   NEUTROABS 2.1 02/09/2022   HGB 15.3 02/09/2022   HCT 43.9 02/09/2022   MCV 93.0 02/09/2022   PLT 201 02/09/2022      Chemistry      Component Value Date/Time   NA 135 12/21/2021 0829   NA 140 07/30/2016 0952   K 4.5 12/21/2021 0829   K 4.2 07/30/2016 0952   CL 97 12/21/2021 0829   CL 107  07/11/2012 0817   CO2 24 12/21/2021 0829   CO2 25 07/30/2016 0952   BUN 9 12/21/2021 0829   BUN 15.2 07/30/2016 0952   CREATININE 0.94 12/21/2021 0829   CREATININE 0.97 07/19/2017 1357   CREATININE 1.0 07/30/2016 0952      Component Value Date/Time   CALCIUM 9.4 12/21/2021 0829   CALCIUM 9.3 07/30/2016 0952   ALKPHOS 56 12/21/2021 0829   ALKPHOS 61 07/30/2016 0952   AST 14 12/21/2021 0829   AST 18 07/19/2017 1357   AST 19 07/30/2016 0952   ALT 10 12/21/2021 0829   ALT 13 07/19/2017 1357   ALT 13 07/30/2016 0952   BILITOT 0.7 12/21/2021 0829   BILITOT 0.5 07/19/2017 1357   BILITOT 1.01 07/30/2016 6962

## 2022-02-09 NOTE — Telephone Encounter (Signed)
-----   Message from Heath Lark, MD sent at 02/09/2022 11:50 AM EDT ----- Pls let him know B12 is ok

## 2022-02-09 NOTE — Telephone Encounter (Signed)
Called and given below message. He verbalized understanding. 

## 2022-02-09 NOTE — Assessment & Plan Note (Signed)
The cause is unknown, could be due to alcoholism and prior exposure to chemotherapy He is not symptomatic B12 level is pending Recommend repeat blood work again in 6 months We discussed importance of avoiding alcohol intake for 7 to 10 days prior to his blood work We discussed the risk and benefits of doing a bone marrow biopsy but he declines

## 2022-02-14 ENCOUNTER — Encounter: Payer: Self-pay | Admitting: Urology

## 2022-02-14 ENCOUNTER — Ambulatory Visit (INDEPENDENT_AMBULATORY_CARE_PROVIDER_SITE_OTHER): Payer: Medicare Other | Admitting: Urology

## 2022-02-14 VITALS — BP 153/80 | HR 93

## 2022-02-14 DIAGNOSIS — Z8551 Personal history of malignant neoplasm of bladder: Secondary | ICD-10-CM

## 2022-02-14 MED ORDER — CIPROFLOXACIN HCL 500 MG PO TABS
500.0000 mg | ORAL_TABLET | Freq: Once | ORAL | Status: AC
  Administered 2022-02-14: 500 mg via ORAL

## 2022-02-14 NOTE — Progress Notes (Signed)
   02/14/22  CC: followup bladder cancer   HPI: [name] is a 76yo here for followup for bladder cancer Blood pressure (!) 153/80, pulse 93. NED. A&Ox3.   No respiratory distress   Abd soft, NT, ND Normal phallus with bilateral descended testicles  Cystoscopy Procedure Note  Patient identification was confirmed, informed consent was obtained, and patient was prepped using Betadine solution.  Lidocaine jelly was administered per urethral meatus.     Pre-Procedure: - Inspection reveals a normal caliber ureteral meatus.  Procedure: The flexible cystoscope was introduced without difficulty - No urethral strictures/lesions are present. - Enlarged prostate  - Normal bladder neck - Bilateral ureteral orifices identified - Bladder mucosa  reveals no ulcers, tumors, or lesions - No bladder stones - No trabeculation    Post-Procedure: - Patient tolerated the procedure well  Assessment/ Plan: Followup 1 year for cystoscopy  No follow-ups on file.  [name], MD

## 2022-02-14 NOTE — Patient Instructions (Signed)

## 2022-02-15 LAB — URINALYSIS, ROUTINE W REFLEX MICROSCOPIC
Bilirubin, UA: NEGATIVE
Glucose, UA: NEGATIVE
Ketones, UA: NEGATIVE
Leukocytes,UA: NEGATIVE
Nitrite, UA: NEGATIVE
Protein,UA: NEGATIVE
RBC, UA: NEGATIVE
Specific Gravity, UA: 1.02 (ref 1.005–1.030)
Urobilinogen, Ur: 1 mg/dL (ref 0.2–1.0)
pH, UA: 7 (ref 5.0–7.5)

## 2022-02-19 ENCOUNTER — Other Ambulatory Visit: Payer: Medicare Other | Admitting: Urology

## 2022-03-16 ENCOUNTER — Ambulatory Visit (INDEPENDENT_AMBULATORY_CARE_PROVIDER_SITE_OTHER): Payer: Medicare Other

## 2022-03-16 VITALS — Ht 71.0 in | Wt 132.0 lb

## 2022-03-16 DIAGNOSIS — I10 Essential (primary) hypertension: Secondary | ICD-10-CM | POA: Insufficient documentation

## 2022-03-16 DIAGNOSIS — R911 Solitary pulmonary nodule: Secondary | ICD-10-CM | POA: Diagnosis not present

## 2022-03-16 DIAGNOSIS — Z0000 Encounter for general adult medical examination without abnormal findings: Secondary | ICD-10-CM

## 2022-03-16 NOTE — Patient Instructions (Signed)
Kenneth Reed , Thank you for taking time to come for your Medicare Wellness Visit. I appreciate your ongoing commitment to your health goals. Please review the following plan we discussed and let me know if I can assist you in the future.   These are the goals we discussed:  Goals      DIET - INCREASE WATER INTAKE     Drink 4 to 6 bottles of water a day     Exercise 150 min/wk Moderate Activity        This is a list of the screening recommended for you and due dates:  Health Maintenance  Topic Date Due   COVID-19 Vaccine (4 - 2023-24 season) 12/08/2021   Medicare Annual Wellness Visit  03/17/2023   DTaP/Tdap/Td vaccine (2 - Td or Tdap) 10/05/2028   Pneumonia Vaccine  Completed   Flu Shot  Completed   Hepatitis C Screening: USPSTF Recommendation to screen - Ages 18-79 yo.  Completed   Zoster (Shingles) Vaccine  Completed   HPV Vaccine  Aged Out   Colon Cancer Screening  Discontinued    Advanced directives: Advance directive discussed with you today. I have provided a copy for you to complete at home and have notarized. Once this is complete please bring a copy in to our office so we can scan it into your chart.   Conditions/risks identified: Aim for 30 minutes of exercise or brisk walking, 6-8 glasses of water, and 5 servings of fruits and vegetables each day.   Next appointment: Follow up in one year for your annual wellness visit.   Preventive Care 73 Years and Older, Male  Preventive care refers to lifestyle choices and visits with your health care provider that can promote health and wellness. What does preventive care include? A yearly physical exam. This is also called an annual well check. Dental exams once or twice a year. Routine eye exams. Ask your health care provider how often you should have your eyes checked. Personal lifestyle choices, including: Daily care of your teeth and gums. Regular physical activity. Eating a healthy diet. Avoiding tobacco and drug  use. Limiting alcohol use. Practicing safe sex. Taking low doses of aspirin every day. Taking vitamin and mineral supplements as recommended by your health care provider. What happens during an annual well check? The services and screenings done by your health care provider during your annual well check will depend on your age, overall health, lifestyle risk factors, and family history of disease. Counseling  Your health care provider may ask you questions about your: Alcohol use. Tobacco use. Drug use. Emotional well-being. Home and relationship well-being. Sexual activity. Eating habits. History of falls. Memory and ability to understand (cognition). Work and work Statistician. Screening  You may have the following tests or measurements: Height, weight, and BMI. Blood pressure. Lipid and cholesterol levels. These may be checked every 5 years, or more frequently if you are over 61 years old. Skin check. Lung cancer screening. You may have this screening every year starting at age 55 if you have a 30-pack-year history of smoking and currently smoke or have quit within the past 15 years. Fecal occult blood test (FOBT) of the stool. You may have this test every year starting at age 46. Flexible sigmoidoscopy or colonoscopy. You may have a sigmoidoscopy every 5 years or a colonoscopy every 10 years starting at age 23. Prostate cancer screening. Recommendations will vary depending on your family history and other risks. Hepatitis C blood test. Hepatitis  B blood test. Sexually transmitted disease (STD) testing. Diabetes screening. This is done by checking your blood sugar (glucose) after you have not eaten for a while (fasting). You may have this done every 1-3 years. Abdominal aortic aneurysm (AAA) screening. You may need this if you are a current or former smoker. Osteoporosis. You may be screened starting at age 26 if you are at high risk. Talk with your health care provider about  your test results, treatment options, and if necessary, the need for more tests. Vaccines  Your health care provider may recommend certain vaccines, such as: Influenza vaccine. This is recommended every year. Tetanus, diphtheria, and acellular pertussis (Tdap, Td) vaccine. You may need a Td booster every 10 years. Zoster vaccine. You may need this after age 10. Pneumococcal 13-valent conjugate (PCV13) vaccine. One dose is recommended after age 48. Pneumococcal polysaccharide (PPSV23) vaccine. One dose is recommended after age 55. Talk to your health care provider about which screenings and vaccines you need and how often you need them. This information is not intended to replace advice given to you by your health care provider. Make sure you discuss any questions you have with your health care provider. Document Released: 04/22/2015 Document Revised: 12/14/2015 Document Reviewed: 01/25/2015 Elsevier Interactive Patient Education  2017 [employer] Prevention in the Home Falls can cause injuries. They can happen to people of all ages. There are many things you can do to make your home safe and to help prevent falls. What can I do on the outside of my home? Regularly fix the edges of walkways and driveways and fix any cracks. Remove anything that might make you trip as you walk through a door, such as a raised step or threshold. Trim any bushes or trees on the path to your home. Use bright outdoor lighting. Clear any walking paths of anything that might make someone trip, such as rocks or tools. Regularly check to see if handrails are loose or broken. Make sure that both sides of any steps have handrails. Any raised decks and porches should have guardrails on the edges. Have any leaves, snow, or ice cleared regularly. Use sand or salt on walking paths during winter. Clean up any spills in your garage right away. This includes oil or grease spills. What can I do in the bathroom? Use  night lights. Install grab bars by the toilet and in the tub and shower. Do not use towel bars as grab bars. Use non-skid mats or decals in the tub or shower. If you need to sit down in the shower, use a plastic, non-slip stool. Keep the floor dry. Clean up any water that spills on the floor as soon as it happens. Remove soap buildup in the tub or shower regularly. Attach bath mats securely with double-sided non-slip rug tape. Do not have throw rugs and other things on the floor that can make you trip. What can I do in the bedroom? Use night lights. Make sure that you have a light by your bed that is easy to reach. Do not use any sheets or blankets that are too big for your bed. They should not hang down onto the floor. Have a firm chair that has side arms. You can use this for support while you get dressed. Do not have throw rugs and other things on the floor that can make you trip. What can I do in the kitchen? Clean up any spills right away. Avoid walking on wet floors. Keep  items that you use a lot in easy-to-reach places. If you need to reach something above you, use a strong step stool that has a grab bar. Keep electrical cords out of the way. Do not use floor polish or wax that makes floors slippery. If you must use wax, use non-skid floor wax. Do not have throw rugs and other things on the floor that can make you trip. What can I do with my stairs? Do not leave any items on the stairs. Make sure that there are handrails on both sides of the stairs and use them. Fix handrails that are broken or loose. Make sure that handrails are as long as the stairways. Check any carpeting to make sure that it is firmly attached to the stairs. Fix any carpet that is loose or worn. Avoid having throw rugs at the top or bottom of the stairs. If you do have throw rugs, attach them to the floor with carpet tape. Make sure that you have a light switch at the top of the stairs and the bottom of the  stairs. If you do not have them, ask someone to add them for you. What else can I do to help prevent falls? Wear shoes that: Do not have high heels. Have rubber bottoms. Are comfortable and fit you well. Are closed at the toe. Do not wear sandals. If you use a stepladder: Make sure that it is fully opened. Do not climb a closed stepladder. Make sure that both sides of the stepladder are locked into place. Ask someone to hold it for you, if possible. Clearly mark and make sure that you can see: Any grab bars or handrails. First and last steps. Where the edge of each step is. Use tools that help you move around (mobility aids) if they are needed. These include: Canes. Walkers. Scooters. Crutches. Turn on the lights when you go into a dark area. Replace any light bulbs as soon as they burn out. Set up your furniture so you have a clear path. Avoid moving your furniture around. If any of your floors are uneven, fix them. If there are any pets around you, be aware of where they are. Review your medicines with your doctor. Some medicines can make you feel dizzy. This can increase your chance of falling. Ask your doctor what other things that you can do to help prevent falls. This information is not intended to replace advice given to you by your health care provider. Make sure you discuss any questions you have with your health care provider. Document Released: 01/20/2009 Document Revised: 09/01/2015 Document Reviewed: 04/30/2014 Elsevier Interactive Patient Education  2017 [employer].

## 2022-03-16 NOTE — Progress Notes (Signed)
Subjective:   Kenneth Reed is a 76 y.o. male who presents for Medicare Annual/Subsequent preventive examination. I connected with  [name] on 03/16/22 by a audio enabled telemedicine application and verified that I am speaking with the correct person using two identifiers.  Patient Location: Home  Provider Location: Home Office  I discussed the limitations of evaluation and management by telemedicine. The patient expressed understanding and agreed to proceed.  Review of Systems     Cardiac Risk Factors include: advanced age (>67mn, >>6women);hypertension;male gender     Objective:    Today's Vitals   03/16/22 1319  Weight: 132 lb (59.9 kg)  Height: '5\' 11"'$  (1.803 m)   Body mass index is 18.41 kg/m.     03/16/2022    1:22 PM 03/01/2021    4:23 PM 12/03/2020    9:36 AM 04/15/2020    8:44 AM 04/12/2020   10:04 AM 03/30/2020    7:15 AM 03/24/2020    2:17 PM  Advanced Directives  Does Patient Have a Medical Advance Directive? No No No No No No No  Would patient like information on creating a medical advance directive? No - Patient declined No - Patient declined  No - Patient declined No - Patient declined  No - Patient declined    Current Medications (verified) Outpatient Encounter Medications as of 03/16/2022  Medication Sig   acetaminophen (TYLENOL) 500 MG tablet Take 1,000 mg by mouth every 6 (six) hours as needed for moderate pain.    albuterol (VENTOLIN HFA) 108 (90 Base) MCG/ACT inhaler Inhale 2 puffs into the lungs every 4 (four) hours as needed.   aspirin EC 81 MG tablet Take 1 tablet (81 mg total) by mouth daily. Swallow whole.   cetirizine (ZYRTEC) 10 MG tablet Take 1 tablet (10 mg total) by mouth daily.   fluticasone (FLONASE) 50 MCG/ACT nasal spray Place 1 spray into both nostrils 2 (two) times daily as needed for allergies or rhinitis.   fluticasone-salmeterol (ADVAIR HFA) 230-21 MCG/ACT inhaler Inhale 2 puffs into the lungs 2 (two) times daily.    lisinopril (ZESTRIL) 20 MG tablet Take 1 tablet (20 mg total) by mouth daily.   rosuvastatin (CRESTOR) 10 MG tablet Take 1 tablet by mouth once daily   tamsulosin (FLOMAX) 0.4 MG CAPS capsule Take 1 capsule (0.4 mg total) by mouth in the morning and at bedtime.   No facility-administered encounter medications on file as of 03/16/2022.    Allergies (verified) Patient has no known allergies.   History: Past Medical History:  Diagnosis Date   Bladder cancer ([facility] 2010   Dr. KMaryland Pinkoperated; no need for chemo; recurred locally  in 2013 locally.     BPH (benign prostatic hyperplasia)    [place]   High Grade NonHodgkin's B- Cell Lymphoma   Colon cancer (HCC) 2010   Dr. DAnthony Saroperated; stage II.  No need for chemo.   COPD [place]ronic obstructive pulmonary disease) ([place]    Fatigue    History of colon cancer 01/07/2013   HOH (hard of hearing)    Kidney stone    Non-Hodgkin's lymphoma of head ([place] 2014   Status post chemotherapy 04/18/2012 - 06/20/12    4 Cycles of R-CHOP with 3 Doses of Prophylactic Intrathecal chemo.   Weight loss 07/11/12 note   Past Surgical History:  Procedure Laterality Date   Biospy Left Tonsil  03/26/12   Non - Hodgkins B-Cell Lympyhoma   BLADDER SURGERY  2010  CATARACT EXTRACTION W/PHACO Right 03/30/2020   Procedure: CATARACT EXTRACTION PHACO AND INTRAOCULAR LENS PLACEMENT (IOC);  Surgeon: Baruch Goldmann, MD;  Location: [facility];  Service: Ophthalmology;  Laterality: Right;  CDE: 15.63   CATARACT EXTRACTION W/PHACO Left 04/15/2020   Procedure: CATARACT EXTRACTION PHACO AND INTRAOCULAR LENS PLACEMENT LEFT EYE;  Surgeon: Baruch Goldmann, MD;  Location: [facility];  Service: Ophthalmology;  Laterality: Left;  CDE 10.20   COLECTOMY  2010   COLONOSCOPY  2012   next one 2014.    CYSTOSCOPY W/ RETROGRADES Bilateral 09/09/2019   Procedure: CYSTOSCOPY WITH RETROGRADE PYELOGRAM;  Surgeon: Cleon Gustin, MD;  Location: [facility];  Service: Urology;  Laterality:  Bilateral;   HAND TENDON SURGERY Bilateral    NASAL POLYP SURGERY  1983   TRANSURETHRAL RESECTION OF BLADDER TUMOR N/A 09/09/2019   Procedure: TRANSURETHRAL RESECTION OF BLADDER TUMOR (TURBT);  Surgeon: Cleon Gustin, MD;  Location: [facility];  Service: Urology;  Laterality: N/A;   Family History  Problem Relation Age of Onset   Cancer Father        leukemia   Cancer Maternal Aunt        leukemia   Cancer Paternal Grandmother        leukemia   Narcolepsy Daughter    Social History   Socioeconomic History   Marital status: Single    Spouse name: Not on file   Number of children: 2   Years of education: GED   Highest education level: GED or equivalent  Occupational History   Occupation: Retired    Fish farm manager: [employer]    Comment: textile   Tobacco Use   Smoking status: Never   Smokeless tobacco: Former    Types: Chew    Quit date: 2010  Vaping Use   Vaping Use: Never used  Substance and Sexual Activity   Alcohol use: Yes    Alcohol/week: 2.0 standard drinks of alcohol    Types: 2 Cans of beer per week    Comment: few beers here and there, nothing regular   Drug use: No   Sexual activity: Not Currently  Other Topics Concern   Not on file  Social History Narrative   Lives with his daughter   Not very sociable - likes to stay home   Social Determinants of Health   Financial Resource Strain: Low Risk  (03/16/2022)   Overall Financial Resource Strain (CARDIA)    Difficulty of Paying Living Expenses: Not hard at all  Food Insecurity: No Food Insecurity (03/16/2022)   Hunger Vital Sign    Worried About Running Out of Food in the Last Year: Never true    Ran Out of Food in the Last Year: Never true  Transportation Needs: No Transportation Needs (03/16/2022)   PRAPARE - Hydrologist (Medical): No    Lack of Transportation (Non-Medical): No  Physical Activity: Sufficiently Active (03/16/2022)   Exercise Vital Sign    Days of Exercise per  Week: 5 days    Minutes of Exercise per Session: 30 min  Stress: No Stress Concern Present (03/16/2022)   [place]    Feeling of Stress : Not at all  Social Connections: Moderately Isolated (03/16/2022)   Social Connection and Isolation Panel [NHANES]    Frequency of Communication with Friends and Family: More than three times a week    Frequency of Social Gatherings with Friends and Family: More than three times a  week    Attends Religious Services: More than 4 times per year    Active Member of Clubs or Organizations: No    Attends Archivist Meetings: Never    Marital Status: Divorced    Tobacco Counseling Counseling given: Not Answered   Clinical Intake:  Pre-visit preparation completed: Yes  Pain : No/denies pain     Nutritional Risks: None Diabetes: No  How often do you need to have someone help you when you read instructions, pamphlets, or other written materials from your doctor or pharmacy?: 1 - Never  Diabetic?no   Interpreter Needed?: No  Information entered by :: Jadene Pierini, LPN   Activities of Daily Living    03/16/2022    1:22 PM  In your present state of health, do you have any difficulty performing the following activities:  Hearing? 0  Vision? 0  Difficulty concentrating or making decisions? 0  Walking or climbing stairs? 0  Dressing or bathing? 0  Doing errands, shopping? 0  Preparing Food and eating ? N  Using the Toilet? N  In the past six months, have you accidently leaked urine? N  Do you have problems with loss of bowel control? N  Managing your Medications? N  Managing your Finances? N  Housekeeping or managing your Housekeeping? N    Patient Care Team: Sharion Balloon, FNP as PCP - General (Nurse Practitioner) Melida Quitter, MD as Attending Physician (Otolaryngology) Miguel Dibble, MD as Attending Physician (Urology) Sadie Haber, MD as Referring  Physician (Urology) Burke Keels, MD as Attending Physician (Surgery) Eloise Harman, DO as Consulting Physician (Gastroenterology)  Indicate any recent Medical Services you may have received from other than [facility] providers in the past year (date may be approximate).     Assessment:   This is a routine wellness examination for [place].  Hearing/Vision screen Vision Screening - Comments:: Wears rx glasses - up to date with routine eye exams with  My eye doctor   Dietary issues and exercise activities discussed: Current Exercise Habits: Home exercise routine, Type of exercise: walking, Time (Minutes): 30, Frequency (Times/Week): 3, Weekly Exercise (Minutes/Week): 90, Intensity: Mild, Exercise limited by: None identified   Goals Addressed             This Visit's Progress    DIET - INCREASE WATER INTAKE   On track    Drink 4 to 6 bottles of water a day       Depression Screen    03/16/2022    1:21 PM 12/21/2021    8:16 AM 11/14/2021    9:46 AM 05/18/2021    9:24 AM 03/01/2021    4:23 PM 11/24/2020    8:23 AM 11/15/2020    9:50 AM  PHQ 2/9 Scores  PHQ - 2 Score 0 '2 2 2 2 2 1  '$ PHQ- 9 Score  '6 6 6 4 3 3    '$ Fall Risk    03/16/2022    1:20 PM 12/21/2021    8:15 AM 11/14/2021    9:43 AM 05/18/2021    9:23 AM 03/01/2021    4:24 PM  [place] in the past year? 0 0 0 0 0  Number falls in past yr: 0    0  Injury with Fall? 0    0  Risk for fall due to : No Fall Risks    No Fall Risks  Follow up Falls prevention discussed    Falls prevention discussed  FALL RISK PREVENTION PERTAINING TO THE HOME:  Any stairs in or around the home? No  If so, are there any without handrails? No  Home free of loose throw rugs in walkways, pet beds, electrical cords, etc? Yes  Adequate lighting in your home to reduce risk of falls? Yes   ASSISTIVE DEVICES UTILIZED TO PREVENT FALLS:  Life alert? No  Use of a cane, walker or w/c? No  Grab bars in the bathroom? Yes  Shower chair or bench  in shower? No  Elevated toilet seat or a handicapped toilet? No       07/23/2017    9:05 AM  MMSE - Mini Mental State Exam  Orientation to time 5  Orientation to Place 5  Registration 3  Attention/ Calculation 5  Recall 3  Language- name 2 objects 2  Language- repeat 1  Language- follow 3 step command 3  Language- read & follow direction 1  Write a sentence 1  Copy design 1  Total score 30        03/16/2022    1:22 PM 03/01/2021    4:18 PM  6CIT Screen  What Year? 0 points 0 points  What month? 0 points 0 points  What time? 0 points 0 points  Count back from 20 0 points 0 points  Months in reverse 0 points 0 points  Repeat phrase 0 points 0 points  Total Score 0 points 0 points    Immunizations Immunization History  Administered Date(s) Administered   Fluad Quad(high Dose 65+) 01/29/2020, 01/23/2021   Influenza Split 01/03/2009   Influenza, High Dose Seasonal PF 01/28/2018, 03/13/2022   Influenza,inj,Quad PF,6+ Mos 01/07/2013, 02/09/2014, 02/03/2015   Influenza-Unspecified 12/16/2017   Moderna Sars-Covid-2 Vaccination 05/22/2019, 06/19/2019   PFIZER Comirnaty(Gray Top)Covid-19 Tri-Sucrose Vaccine 06/19/2019   PNEUMOCOCCAL CONJUGATE-20 12/28/2020   Pneumococcal Conjugate-13 02/03/2015   Pneumococcal Polysaccharide-23 01/03/2009, 02/20/2016   Tdap 10/06/2018   Zoster Recombinat (Shingrix) 10/06/2018, 12/28/2020    TDAP status: Up to date  Flu Vaccine status: Up to date  Pneumococcal vaccine status: Up to date  Covid-19 vaccine status: Completed vaccines  Qualifies for Shingles Vaccine? Yes   Zostavax completed Yes   Shingrix Completed?: Yes  Screening Tests Health Maintenance  Topic Date Due   COVID-19 Vaccine (4 - 2023-24 season) 12/08/2021   Medicare Annual Wellness (AWV)  03/17/2023   DTaP/Tdap/Td (2 - Td or Tdap) 10/05/2028   Pneumonia Vaccine 37+ Years old  Completed   INFLUENZA VACCINE  Completed   Hepatitis C Screening  Completed   Zoster  Vaccines- Shingrix  Completed   HPV VACCINES  Aged Out   COLONOSCOPY (Pts 45-38yr Insurance coverage will need to be confirmed)  Discontinued    Health Maintenance  Health Maintenance Due  Topic Date Due   COVID-19 Vaccine (4 - 2023-24 season) 12/08/2021    Colorectal cancer screening: No longer required.   Lung Cancer Screening: (Low Dose CT Chest recommended if Age 78-80years, 30 pack-year currently smoking OR have quit w/in 15years.) does qualify.   Lung Cancer Screening Referral: declines   Additional Screening:  Hepatitis C Screening: does not qualify;   Vision Screening: Recommended annual ophthalmology exams for early detection of glaucoma and other disorders of the eye. Is the patient up to date with their annual eye exam?  Yes  Who is the provider or what is the name of the office in which the patient attends annual eye exams? Dr,Johnson  If pt is not established with a  provider, would they like to be referred to a provider to establish care? No .   Dental Screening: Recommended annual dental exams for proper oral hygiene  Community Resource Referral / Chronic Care Management: CRR required this visit?  No   CCM required this visit?  No      Plan:     I have personally reviewed and noted the following in the patient's chart:   Medical and social history Use of alcohol, tobacco or illicit drugs  Current medications and supplements including opioid prescriptions. Patient is not currently taking opioid prescriptions. Functional ability and status Nutritional status Physical activity Advanced directives List of other physicians Hospitalizations, surgeries, and ER visits in previous 12 months Vitals Screenings to include cognitive, depression, and falls Referrals and appointments  In addition, I have reviewed and discussed with patient certain preventive protocols, quality metrics, and best practice recommendations. A written personalized care plan for  preventive services as well as general preventive health recommendations were provided to patient.     Daphane Shepherd, LPN   02/12/5207   Nurse Notes: none

## 2022-03-18 DIAGNOSIS — R911 Solitary pulmonary nodule: Secondary | ICD-10-CM | POA: Diagnosis not present

## 2022-06-25 ENCOUNTER — Ambulatory Visit: Payer: Medicare Other | Admitting: Family

## 2022-07-23 ENCOUNTER — Encounter: Payer: Self-pay | Admitting: Family

## 2022-07-23 ENCOUNTER — Ambulatory Visit (INDEPENDENT_AMBULATORY_CARE_PROVIDER_SITE_OTHER): Payer: Medicare Other | Admitting: Family

## 2022-07-23 VITALS — BP 129/79 | HR 80 | Temp 97.6°F | Ht 71.0 in | Wt 134.0 lb

## 2022-07-23 DIAGNOSIS — C8599 Non-Hodgkin lymphoma, unspecified, extranodal and solid organ sites: Secondary | ICD-10-CM | POA: Diagnosis not present

## 2022-07-23 DIAGNOSIS — I10 Essential (primary) hypertension: Secondary | ICD-10-CM

## 2022-07-23 DIAGNOSIS — J42 Unspecified chronic bronchitis: Secondary | ICD-10-CM

## 2022-07-23 DIAGNOSIS — H9193 Unspecified hearing loss, bilateral: Secondary | ICD-10-CM | POA: Diagnosis not present

## 2022-07-23 DIAGNOSIS — E559 Vitamin D deficiency, unspecified: Secondary | ICD-10-CM | POA: Diagnosis not present

## 2022-07-23 DIAGNOSIS — N401 Enlarged prostate with lower urinary tract symptoms: Secondary | ICD-10-CM

## 2022-07-23 DIAGNOSIS — R350 Frequency of micturition: Secondary | ICD-10-CM

## 2022-07-23 DIAGNOSIS — K219 Gastro-esophageal reflux disease without esophagitis: Secondary | ICD-10-CM

## 2022-07-23 DIAGNOSIS — E785 Hyperlipidemia, unspecified: Secondary | ICD-10-CM | POA: Diagnosis not present

## 2022-07-23 DIAGNOSIS — Z8551 Personal history of malignant neoplasm of bladder: Secondary | ICD-10-CM

## 2022-07-23 DIAGNOSIS — I700 Atherosclerosis of aorta: Secondary | ICD-10-CM

## 2022-07-23 DIAGNOSIS — R918 Other nonspecific abnormal finding of lung field: Secondary | ICD-10-CM

## 2022-07-23 NOTE — Patient Instructions (Signed)
Health Maintenance After Age 79 After age 79, you are at a higher risk for certain long-term diseases and infections as well as injuries from falls. Falls are a major cause of broken bones and head injuries in people who are older than age 79. Getting regular preventive care can help to keep you healthy and well. Preventive care includes getting regular testing and making lifestyle changes as recommended by your health care provider. Talk with your health care provider about: Which screenings and tests you should have. A screening is a test that checks for a disease when you have no symptoms. A diet and exercise plan that is right for you. What should I know about screenings and tests to prevent falls? Screening and testing are the best ways to find a health problem early. Early diagnosis and treatment give you the best chance of managing medical conditions that are common after age 79. Certain conditions and lifestyle choices may make you more likely to have a fall. Your health care provider may recommend: Regular vision checks. Poor vision and conditions such as cataracts can make you more likely to have a fall. If you wear glasses, make sure to get your prescription updated if your vision changes. Medicine review. Work with your health care provider to regularly review all of the medicines you are taking, including over-the-counter medicines. Ask your health care provider about any side effects that may make you more likely to have a fall. Tell your health care provider if any medicines that you take make you feel dizzy or sleepy. Strength and balance checks. Your health care provider may recommend certain tests to check your strength and balance while standing, walking, or changing positions. Foot health exam. Foot pain and numbness, as well as not wearing proper footwear, can make you more likely to have a fall. Screenings, including: Osteoporosis screening. Osteoporosis is a condition that causes  the bones to get weaker and break more easily. Blood pressure screening. Blood pressure changes and medicines to control blood pressure can make you feel dizzy. Depression screening. You may be more likely to have a fall if you have a fear of falling, feel depressed, or feel unable to do activities that you used to do. Alcohol use screening. Using too much alcohol can affect your balance and may make you more likely to have a fall. Follow these instructions at home: Lifestyle Do not drink alcohol if: Your health care provider tells you not to drink. If you drink alcohol: Limit how much you have to: 0-1 drink a day for women. 0-2 drinks a day for men. Know how much alcohol is in your drink. In the [place] one drink equals one 12 oz bottle of beer (355 mL), one 5 oz glass of wine (148 mL), or one 1 oz glass of hard liquor (44 mL). Do not use any products that contain nicotine or tobacco. These products include cigarettes, chewing tobacco, and vaping devices, such as e-cigarettes. If you need help quitting, ask your health care provider. Activity  Follow a regular exercise program to stay fit. This will help you maintain your balance. Ask your health care provider what types of exercise are appropriate for you. If you need a cane or walker, use it as recommended by your health care provider. Wear supportive shoes that have nonskid soles. Safety  Remove any tripping hazards, such as rugs, cords, and clutter. Install safety equipment such as grab bars in bathrooms and safety rails on stairs. Keep rooms and walkways   well-lit. General instructions Talk with your health care provider about your risks for falling. Tell your health care provider if: You fall. Be sure to tell your health care provider about all falls, even ones that seem minor. You feel dizzy, tiredness (fatigue), or off-balance. Take over-the-counter and prescription medicines only as told by your health care provider. These include  supplements. Eat a healthy diet and maintain a healthy weight. A healthy diet includes low-fat dairy products, low-fat (lean) meats, and fiber from whole grains, beans, and lots of fruits and vegetables. Stay current with your vaccines. Schedule regular health, dental, and eye exams. Summary Having a healthy lifestyle and getting preventive care can help to protect your health and wellness after age 79. Screening and testing are the best way to find a health problem early and help you avoid having a fall. Early diagnosis and treatment give you the best chance for managing medical conditions that are more common for people who are older than age 79. Falls are a major cause of broken bones and head injuries in people who are older than age 79. Take precautions to prevent a fall at home. Work with your health care provider to learn what changes you can make to improve your health and wellness and to prevent falls. This information is not intended to replace advice given to you by your health care provider. Make sure you discuss any questions you have with your health care provider. Document Revised: 08/15/2020 Document Reviewed: 08/15/2020 Elsevier Patient Education  2023 Elsevier Inc.  

## 2022-07-23 NOTE — Progress Notes (Signed)
Subjective:    Patient ID: Kenneth Reed, male    DOB: Jan 12, 1946, 77 y.o.   MRN: [id number]  Chief Complaint  Patient presents with   Medical Management of Chronic Issues   Pt presents to the office today for chronic follow up. [facility] He is followed by Urologists annually for BPH and hx bladder cancer. He is followed by ENT as needed.    Pulmonologist annually for COPD and lung nodules. 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  He reports his breathing is stable and taking Advair BID.    He has aortic atherosclerosis and takes Crestor daily. Stable    Hypertension This is a chronic problem. The current episode started more than 1 year ago. The problem has been resolved since onset. The problem is controlled. Pertinent negatives include no malaise/fatigue, peripheral edema or shortness of breath. Risk factors for coronary artery disease include dyslipidemia and male gender. The current treatment provides moderate improvement.  Benign Prostatic Hypertrophy This is a chronic problem. The current episode started more than 1 year ago. Irritative symptoms include nocturia (3). Past treatments include tamsulosin. The treatment provided mild relief.  Hyperlipidemia This is a chronic problem. The current episode started more than 1 year ago. The problem is controlled. Recent lipid tests were reviewed and are normal. Pertinent negatives include no shortness of breath. Current antihyperlipidemic treatment includes statins. The current treatment provides moderate improvement of lipids. Risk factors for coronary artery disease include dyslipidemia, hypertension and a sedentary lifestyle.      Review of Systems  Constitutional:  Negative for malaise/fatigue.  Respiratory:  Negative for shortness of breath.   Genitourinary:  Positive for nocturia (3).  All other systems reviewed and are negative.      Objective:   Physical Exam Vitals reviewed.   Constitutional:      General: He is not in acute distress.    Appearance: He is well-developed.  HENT:     Head: Normocephalic.     Right Ear: Tympanic membrane normal.     Left Ear: Tympanic membrane normal.     Ears:     Comments: HOH Eyes:     General:        Right eye: No discharge.        Left eye: No discharge.     Pupils: Pupils are equal, round, and reactive to light.  Neck:     Thyroid: No thyromegaly.  Cardiovascular:     Rate and Rhythm: Normal rate and regular rhythm.     Heart sounds: Normal heart sounds. No murmur heard. Pulmonary:     Effort: Pulmonary effort is normal. No respiratory distress.     Breath sounds: Normal breath sounds. No wheezing.  Abdominal:     General: Bowel sounds are normal. There is no distension.     Palpations: Abdomen is soft.     Tenderness: There is no abdominal tenderness.  Musculoskeletal:        General: No tenderness. Normal range of motion.     Cervical back: Normal range of motion and neck supple.  Skin:    General: Skin is warm and dry.     Findings: No erythema or rash.  Neurological:     Mental Status: He is alert and oriented to person, place, and time.     Cranial Nerves: No cranial nerve deficit.     Deep Tendon Reflexes: Reflexes are normal and symmetric.  Psychiatric:  Behavior: Behavior normal.        Thought Content: Thought content normal.        Judgment: Judgment normal.     BP 129/79   Pulse 80   Temp 97.6 F (36.4 C) (Temporal)   Ht 5\' 11"  (1.803 m)   Wt 134 lb (60.8 kg)   SpO2 97%   BMI 18.69 kg/m       Assessment & Plan:  Kenneth Reed comes in today with chief complaint of Medical Management of Chronic Issues   Diagnosis and orders addressed:  1. Non-Hodgkin's lymphoma of tonsil - CMP14+EGFR - CBC with Differential/Platelet  2. Benign prostatic hyperplasia with urinary frequency - CMP14+EGFR - CBC with Differential/Platelet  3. Chronic bronchitis, unspecified chronic  bronchitis type - CMP14+EGFR - CBC with Differential/Platelet  4. Vitamin D deficiency - CMP14+EGFR - CBC with Differential/Platelet  5. Aortic atherosclerosis  - CMP14+EGFR - CBC with Differential/Platelet  6. Hyperlipidemia, unspecified hyperlipidemia type - CMP14+EGFR - CBC with Differential/Platelet  7. Benign essential hypertension - CMP14+EGFR - CBC with Differential/Platelet  8. Laryngopharyngeal reflux (LPR)  - CMP14+EGFR - CBC with Differential/Platelet  9. Bilateral hearing loss, unspecified hearing loss type - CMP14+EGFR - CBC with Differential/Platelet  10. History of bladder cancer - CMP14+EGFR - CBC with Differential/Platelet  11. Multiple lung nodules on CT  - CMP14+EGFR - CBC with Differential/Platelet   Labs pending Health Maintenance reviewed Diet and exercise encouraged  Follow up plan: 6 months   Jannifer Rodney, FNP

## 2022-07-24 LAB — CBC WITH DIFFERENTIAL/PLATELET
Basophils Absolute: 0 10*3/uL (ref 0.0–0.2)
Basos: 1 %
EOS (ABSOLUTE): 0.1 10*3/uL (ref 0.0–0.4)
Eos: 2 %
Hematocrit: 42.9 % (ref 37.5–51.0)
Hemoglobin: 14.7 g/dL (ref 13.0–17.7)
Immature Grans (Abs): 0 10*3/uL (ref 0.0–0.1)
Immature Granulocytes: 0 %
Lymphocytes Absolute: 0.9 10*3/uL (ref 0.7–3.1)
Lymphs: 23 %
MCH: 31.5 pg (ref 26.6–33.0)
MCHC: 34.3 g/dL (ref 31.5–35.7)
MCV: 92 fL (ref 79–97)
Monocytes Absolute: 0.2 10*3/uL (ref 0.1–0.9)
Monocytes: 6 %
Neutrophils Absolute: 2.5 10*3/uL (ref 1.4–7.0)
Neutrophils: 68 %
Platelets: 226 10*3/uL (ref 150–450)
RBC: 4.67 x10E6/uL (ref 4.14–5.80)
RDW: 12 % (ref 11.6–15.4)
WBC: 3.7 10*3/uL (ref 3.4–10.8)

## 2022-07-24 LAB — CMP14+EGFR
ALT: 19 IU/L (ref 0–44)
AST: 23 IU/L (ref 0–40)
Albumin/Globulin Ratio: 2 (ref 1.2–2.2)
Albumin: 4.7 g/dL (ref 3.8–4.8)
Alkaline Phosphatase: 66 IU/L (ref 44–121)
BUN/Creatinine Ratio: 18 (ref 10–24)
BUN: 16 mg/dL (ref 8–27)
Bilirubin Total: 0.9 mg/dL (ref 0.0–1.2)
CO2: 21 mmol/L (ref 20–29)
Calcium: 9.5 mg/dL (ref 8.6–10.2)
Chloride: 100 mmol/L (ref 96–106)
Creatinine, Ser: 0.89 mg/dL (ref 0.76–1.27)
Globulin, Total: 2.4 g/dL (ref 1.5–4.5)
Glucose: 101 mg/dL — ABNORMAL HIGH (ref 70–99)
Potassium: 4.6 mmol/L (ref 3.5–5.2)
Sodium: 138 mmol/L (ref 134–144)
Total Protein: 7.1 g/dL (ref 6.0–8.5)
eGFR: 87 mL/min/{1.73_m2} (ref >59)

## 2022-07-27 ENCOUNTER — Telehealth: Payer: Self-pay | Admitting: Hematology and Oncology

## 2022-07-27 NOTE — Telephone Encounter (Signed)
Patient did not want to keep appointments. Saying PCP is covering lab work. Cancelled May appointments per patient request.

## 2022-08-10 ENCOUNTER — Inpatient Hospital Stay: Payer: Medicare Other

## 2022-08-10 ENCOUNTER — Inpatient Hospital Stay: Payer: Medicare Other | Admitting: Hematology and Oncology

## 2022-11-21 ENCOUNTER — Ambulatory Visit (INDEPENDENT_AMBULATORY_CARE_PROVIDER_SITE_OTHER): Payer: Medicare Other | Admitting: Family Medicine

## 2022-11-21 ENCOUNTER — Ambulatory Visit: Payer: Medicare Other | Admitting: Nurse Practitioner

## 2022-11-21 ENCOUNTER — Encounter: Payer: Self-pay | Admitting: Family Medicine

## 2022-11-21 VITALS — BP 147/82 | HR 95 | Temp 98.1°F | Ht 71.0 in | Wt 135.2 lb

## 2022-11-21 DIAGNOSIS — J42 Unspecified chronic bronchitis: Secondary | ICD-10-CM | POA: Diagnosis not present

## 2022-11-21 DIAGNOSIS — I10 Essential (primary) hypertension: Secondary | ICD-10-CM

## 2022-11-21 DIAGNOSIS — F419 Anxiety disorder, unspecified: Secondary | ICD-10-CM | POA: Diagnosis not present

## 2022-11-21 DIAGNOSIS — R058 Other specified cough: Secondary | ICD-10-CM | POA: Diagnosis not present

## 2022-11-21 DIAGNOSIS — H9193 Unspecified hearing loss, bilateral: Secondary | ICD-10-CM | POA: Diagnosis not present

## 2022-11-21 MED ORDER — DOXYCYCLINE HYCLATE 100 MG PO TABS
100.0000 mg | ORAL_TABLET | Freq: Two times a day (BID) | ORAL | 0 refills | Status: AC
Start: 2022-11-21 — End: 2022-11-28

## 2022-11-21 MED ORDER — PREDNISONE 20 MG PO TABS: 40.0000 mg | ORAL_TABLET | Freq: Every day | ORAL | 0 refills | Status: AC

## 2022-11-21 NOTE — Patient Instructions (Signed)
Prilosec OTC  Continue zyrtec and flonase  Mucinex

## 2022-11-21 NOTE — Progress Notes (Signed)
Acute Office Visit  Subjective:  Patient ID: Kenneth Reed, male    DOB: 03/23/1946, 77 y.o.   MRN: [id number]  Chief Complaint  Patient presents with   Sore Throat    Has been going on for about 3 weeks. States he has had drainage in is throat that is irritating it.    Sore Throat   Patient is in today for sore throat with a lot of drainage. States that he has to clear his throat constantly and it is causing him pain. He is reports gagging and throwing up due to the drainage  States that it is clear and white. Previously tried [employer] and zyrtec but stopped it as he felt it did not seem to be helping. Reports that he went to an ENT several years ago that questioned if it was due to GERD. Denies heart burn, trouble swallowing, hematochezia and hematemesis. States that it is worse after dinner. Not worse at night.  Worsens with spicy foods. Reports that he tries to avoid spicy foods.   ROS As per HPI  Objective:  BP (!) 147/82   Pulse 95   Temp 98.1 F (36.7 C) (Temporal)   Ht 5\' 11"  (1.803 m)   Wt 135 lb 3.2 oz (61.3 kg)   SpO2 97%   BMI 18.86 kg/m   Physical Exam Constitutional:      General: He is awake. He is not in acute distress.    Appearance: Normal appearance. He is well-developed and well-groomed. He is not ill-appearing, toxic-appearing or diaphoretic.  HENT:     Right Ear: Decreased hearing noted. No laceration, drainage, swelling or tenderness. No middle ear effusion. There is no impacted cerumen. No foreign body. No mastoid tenderness. No PE tube. No hemotympanum. Tympanic membrane is not injected, scarred, perforated, erythematous, retracted or bulging.     Left Ear: Decreased hearing noted. No laceration, drainage, swelling or tenderness.  No middle ear effusion. There is no impacted cerumen. No foreign body. No mastoid tenderness. No PE tube. No hemotympanum. Tympanic membrane is not injected, scarred, perforated, erythematous, retracted or bulging.     Ears:      Comments: Very hard of hearing     Nose: No nasal deformity, congestion or rhinorrhea.     Right Sinus: No maxillary sinus tenderness or frontal sinus tenderness.     Left Sinus: No maxillary sinus tenderness or frontal sinus tenderness.     Mouth/Throat:     Lips: Pink.     Mouth: Mucous membranes are moist.     Tongue: No lesions.     Palate: No mass.     Pharynx: Posterior oropharyngeal erythema present.     Tonsils: No tonsillar exudate or tonsillar abscesses.  Cardiovascular:     Rate and Rhythm: Normal rate.     Pulses: Normal pulses.          Radial pulses are 2+ on the right side and 2+ on the left side.       Posterior tibial pulses are 2+ on the right side and 2+ on the left side.     Heart sounds: Normal heart sounds. No murmur heard.    No gallop.  Pulmonary:     Effort: Pulmonary effort is normal. No respiratory distress.     Breath sounds: Normal breath sounds. No stridor, decreased air movement or transmitted upper airway sounds. No decreased breath sounds, wheezing, rhonchi or rales.  Musculoskeletal:     Cervical back: Full passive range  of motion without pain and neck supple.     Right lower leg: No edema.     Left lower leg: No edema.  Lymphadenopathy:     Head:     Right side of head: No submental, submandibular, tonsillar, preauricular or posterior auricular adenopathy.     Left side of head: No submental, submandibular, tonsillar, preauricular or posterior auricular adenopathy.     Cervical:     Right cervical: No superficial, deep or posterior cervical adenopathy.    Left cervical: No superficial, deep or posterior cervical adenopathy.     Comments: Mild tenderness during palpation of cervical chain   Skin:    General: Skin is warm.     Capillary Refill: Capillary refill takes less than 2 seconds.  Neurological:     General: No focal deficit present.     Mental Status: He is alert, oriented to person, place, and time and easily aroused. Mental status is  at baseline.     GCS: GCS eye subscore is 4. GCS verbal subscore is 5. GCS motor subscore is 6.     Motor: No weakness.  Psychiatric:        Attention and Perception: Attention and perception normal.        Mood and Affect: Mood and affect normal.        Speech: Speech normal.        Behavior: Behavior normal. Behavior is cooperative.        Thought Content: Thought content normal. Thought content does not include homicidal or suicidal ideation. Thought content does not include homicidal or suicidal plan.        Cognition and Memory: Cognition and memory normal.        Judgment: Judgment normal.       11/21/2022    1:24 PM 07/23/2022    9:18 AM 03/16/2022    1:21 PM  Depression screen PHQ 2/9  Decreased Interest 1 0 0  Down, Depressed, Hopeless 1 1 0  PHQ - 2 Score 2 1 0  Altered sleeping 0 0   Tired, decreased energy 1 1   Change in appetite 0 0   Feeling bad or failure about yourself  0 1   Trouble concentrating 0 0   Moving slowly or fidgety/restless 0 0   Suicidal thoughts 0 0   PHQ-9 Score 3 3   Difficult doing work/chores Not difficult at all Not difficult at all       11/21/2022    1:24 PM 07/23/2022    9:20 AM 12/21/2021    8:16 AM 11/14/2021    9:46 AM  GAD 7 : Generalized Anxiety Score  Nervous, Anxious, on Edge 1 0 2 1  Control/stop worrying 1 1 2 1   Worry too much - different things 1 1 2 1   Trouble relaxing 0 0 2 1  Restless 0 0 1 1  Easily annoyed or irritable 1 0 1 1  Afraid - awful might happen 1 1 1 1   Total GAD 7 Score 5 3 11 7   Anxiety Difficulty Not difficult at all Somewhat difficult Somewhat difficult Not difficult at all   Assessment & Plan:  1. Chronic bronchitis, unspecified chronic bronchitis type (HCC) Will start medications as below to treat for suspected COPD exacerbation. Discussed with patient return precautions.  - predniSONE (DELTASONE) 20 MG tablet; Take 2 tablets (40 mg total) by mouth daily with breakfast for 5 days.  Dispense: 10  tablet; Refill: 0 - doxycycline (VIBRA-TABS)  100 MG tablet; Take 1 tablet (100 mg total) by mouth 2 (two) times daily for 7 days.  Dispense: 14 tablet; Refill: 0  2. Productive cough Instructed patient to trial Prilosec OTC to rule out if symptoms are due to GERD.  - predniSONE (DELTASONE) 20 MG tablet; Take 2 tablets (40 mg total) by mouth daily with breakfast for 5 days.  Dispense: 10 tablet; Refill: 0 - doxycycline (VIBRA-TABS) 100 MG tablet; Take 1 tablet (100 mg total) by mouth 2 (two) times daily for 7 days.  Dispense: 14 tablet; Refill: 0  3. Anxiety Patient screened positive for anxiety. Is not currently on treatment. Denies SI. Patient to follow up with PCP for anxiety and depression treatment.  The above assessment and management plan was discussed with the patient. The patient verbalized understanding of and has agreed to the management plan using shared-decision making. Patient is aware to call the clinic if they develop any new symptoms or if symptoms fail to improve or worsen. Patient is aware when to return to the clinic for a follow-up visit. Patient educated on when it is appropriate to go to the emergency department.   Return if symptoms worsen or fail to improve.  Neale Burly, DNP-FNP [facility] [address] [place], [place] [address] [phone]

## 2023-01-22 ENCOUNTER — Encounter: Payer: Self-pay | Admitting: Family

## 2023-01-22 ENCOUNTER — Ambulatory Visit: Payer: Medicare Other | Admitting: Family

## 2023-01-22 VITALS — BP 132/69 | HR 86 | Temp 97.9°F | Resp 18 | Ht 71.0 in | Wt 133.0 lb

## 2023-01-22 DIAGNOSIS — J42 Unspecified chronic bronchitis: Secondary | ICD-10-CM

## 2023-01-22 DIAGNOSIS — I10 Essential (primary) hypertension: Secondary | ICD-10-CM | POA: Diagnosis not present

## 2023-01-22 DIAGNOSIS — I700 Atherosclerosis of aorta: Secondary | ICD-10-CM | POA: Diagnosis not present

## 2023-01-22 DIAGNOSIS — Z8551 Personal history of malignant neoplasm of bladder: Secondary | ICD-10-CM

## 2023-01-22 DIAGNOSIS — N401 Enlarged prostate with lower urinary tract symptoms: Secondary | ICD-10-CM | POA: Diagnosis not present

## 2023-01-22 DIAGNOSIS — E559 Vitamin D deficiency, unspecified: Secondary | ICD-10-CM | POA: Diagnosis not present

## 2023-01-22 DIAGNOSIS — C8599 Non-Hodgkin lymphoma, unspecified, extranodal and solid organ sites: Secondary | ICD-10-CM

## 2023-01-22 DIAGNOSIS — R350 Frequency of micturition: Secondary | ICD-10-CM | POA: Diagnosis not present

## 2023-01-22 DIAGNOSIS — E785 Hyperlipidemia, unspecified: Secondary | ICD-10-CM | POA: Diagnosis not present

## 2023-01-22 MED ORDER — ASPIRIN 81 MG PO TBEC
81.0000 mg | DELAYED_RELEASE_TABLET | Freq: Every day | ORAL | 12 refills | Status: AC
Start: 2023-01-22 — End: ?

## 2023-01-22 NOTE — Patient Instructions (Signed)
Health Maintenance After Age 79 After age 79, you are at a higher risk for certain long-term diseases and infections as well as injuries from falls. Falls are a major cause of broken bones and head injuries in people who are older than age 79. Getting regular preventive care can help to keep you healthy and well. Preventive care includes getting regular testing and making lifestyle changes as recommended by your health care provider. Talk with your health care provider about: Which screenings and tests you should have. A screening is a test that checks for a disease when you have no symptoms. A diet and exercise plan that is right for you. What should I know about screenings and tests to prevent falls? Screening and testing are the best ways to find a health problem early. Early diagnosis and treatment give you the best chance of managing medical conditions that are common after age 79. Certain conditions and lifestyle choices may make you more likely to have a fall. Your health care provider may recommend: Regular vision checks. Poor vision and conditions such as cataracts can make you more likely to have a fall. If you wear glasses, make sure to get your prescription updated if your vision changes. Medicine review. Work with your health care provider to regularly review all of the medicines you are taking, including over-the-counter medicines. Ask your health care provider about any side effects that may make you more likely to have a fall. Tell your health care provider if any medicines that you take make you feel dizzy or sleepy. Strength and balance checks. Your health care provider may recommend certain tests to check your strength and balance while standing, walking, or changing positions. Foot health exam. Foot pain and numbness, as well as not wearing proper footwear, can make you more likely to have a fall. Screenings, including: Osteoporosis screening. Osteoporosis is a condition that causes  the bones to get weaker and break more easily. Blood pressure screening. Blood pressure changes and medicines to control blood pressure can make you feel dizzy. Depression screening. You may be more likely to have a fall if you have a fear of falling, feel depressed, or feel unable to do activities that you used to do. Alcohol use screening. Using too much alcohol can affect your balance and may make you more likely to have a fall. Follow these instructions at home: Lifestyle Do not drink alcohol if: Your health care provider tells you not to drink. If you drink alcohol: Limit how much you have to: 0-1 drink a day for women. 0-2 drinks a day for men. Know how much alcohol is in your drink. In the [place] one drink equals one 12 oz bottle of beer (355 mL), one 5 oz glass of wine (148 mL), or one 1 oz glass of hard liquor (44 mL). Do not use any products that contain nicotine or tobacco. These products include cigarettes, chewing tobacco, and vaping devices, such as e-cigarettes. If you need help quitting, ask your health care provider. Activity  Follow a regular exercise program to stay fit. This will help you maintain your balance. Ask your health care provider what types of exercise are appropriate for you. If you need a cane or walker, use it as recommended by your health care provider. Wear supportive shoes that have nonskid soles. Safety  Remove any tripping hazards, such as rugs, cords, and clutter. Install safety equipment such as grab bars in bathrooms and safety rails on stairs. Keep rooms and walkways   well-lit. General instructions Talk with your health care provider about your risks for falling. Tell your health care provider if: You fall. Be sure to tell your health care provider about all falls, even ones that seem minor. You feel dizzy, tiredness (fatigue), or off-balance. Take over-the-counter and prescription medicines only as told by your health care provider. These include  supplements. Eat a healthy diet and maintain a healthy weight. A healthy diet includes low-fat dairy products, low-fat (lean) meats, and fiber from whole grains, beans, and lots of fruits and vegetables. Stay current with your vaccines. Schedule regular health, dental, and eye exams. Summary Having a healthy lifestyle and getting preventive care can help to protect your health and wellness after age 79. Screening and testing are the best way to find a health problem early and help you avoid having a fall. Early diagnosis and treatment give you the best chance for managing medical conditions that are more common for people who are older than age 79. Falls are a major cause of broken bones and head injuries in people who are older than age 79. Take precautions to prevent a fall at home. Work with your health care provider to learn what changes you can make to improve your health and wellness and to prevent falls. This information is not intended to replace advice given to you by your health care provider. Make sure you discuss any questions you have with your health care provider. Document Revised: 08/15/2020 Document Reviewed: 08/15/2020 Elsevier Patient Education  2024 [employer]vier Inc.  

## 2023-01-22 NOTE — Progress Notes (Signed)
Subjective:    Patient ID: Kenneth Reed, male    DOB: [birth year], 77 y.o.   MRN: [id number]  Chief Complaint  Patient presents with   Medical Management of Chronic Issues   Fall    A MTH AGO AT OME HE WAS DIZZY HE HAS NOT HAD A PROBLEM SINCDE    Pt presents to the office today for chronic follow up. [facility] He is followed by Urologists annually for BPH and hx bladder cancer. He is followed by ENT as needed for Non-hodgkin lymphoma of tonsil.   Pulmonologist annually for COPD and lung nodules. 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  He reports his breathing is stable and taking Advair BID.    He has aortic atherosclerosis and takes Crestor daily. Stable Hypertension This is a chronic problem. The current episode started more than 1 year ago. The problem has been resolved since onset. The problem is controlled. Pertinent negatives include no malaise/fatigue, peripheral edema or shortness of breath. Risk factors for coronary artery disease include dyslipidemia, male gender and sedentary lifestyle. Past treatments include ACE inhibitors. The current treatment provides moderate improvement.  Benign Prostatic Hypertrophy This is a chronic problem. The current episode started more than 1 year ago. Irritative symptoms include nocturia (3). Irritative symptoms do not include urgency. Obstructive symptoms include an intermittent stream and straining. Past treatments include tamsulosin. The treatment provided mild relief.  Hyperlipidemia This is a chronic problem. The current episode started more than 1 year ago. Pertinent negatives include no shortness of breath.      Review of Systems  Constitutional:  Negative for malaise/fatigue.  Respiratory:  Negative for shortness of breath.   Genitourinary:  Positive for nocturia (3). Negative for urgency.  All other systems reviewed and are negative.      Objective:   Physical Exam Vitals reviewed.   Constitutional:      General: He is not in acute distress.    Appearance: He is well-developed.  HENT:     Head: Normocephalic.     Right Ear: Tympanic membrane normal.     Left Ear: Tympanic membrane normal.     Ears:     Comments: HOH  Eyes:     General:        Right eye: No discharge.        Left eye: No discharge.     Pupils: Pupils are equal, round, and reactive to light.  Neck:     Thyroid: No thyromegaly.  Cardiovascular:     Rate and Rhythm: Normal rate and regular rhythm.     Heart sounds: Normal heart sounds. No murmur heard. Pulmonary:     Effort: Pulmonary effort is normal. No respiratory distress.     Breath sounds: Normal breath sounds. No wheezing.  Abdominal:     General: Bowel sounds are normal. There is no distension.     Palpations: Abdomen is soft.     Tenderness: There is no abdominal tenderness.  Musculoskeletal:        General: No tenderness. Normal range of motion.     Cervical back: Normal range of motion and neck supple.  Skin:    General: Skin is warm and dry.     Findings: No erythema or rash.  Neurological:     Mental Status: He is alert and oriented to person, place, and time.     Cranial Nerves: No cranial nerve deficit.     Deep  Tendon Reflexes: Reflexes are normal and symmetric.  Psychiatric:        Behavior: Behavior normal.        Thought Content: Thought content normal.        Judgment: Judgment normal.      BP 132/69   Pulse 86   Temp 97.9 F (36.6 C)   Resp 18   Ht 5\' 11"  (1.803 m)   Wt 133 lb (60.3 kg)   SpO2 100%   BMI 18.55 kg/m       Assessment & Plan:   Kenneth Reed comes in today with chief complaint of Medical Management of Chronic Issues and Fall (A MTH AGO AT OME HE WAS DIZZY HE HAS NOT HAD A PROBLEM SINCDE )   Diagnosis and orders addressed:  1. Aortic atherosclerosis (HCC) - aspirin EC 81 MG tablet; Take 1 tablet (81 mg total) by mouth daily. Swallow whole.  Dispense: 30 tablet; Refill: 12 -  CMP14+EGFR - CBC with Differential/Platelet - Lipid panel  2. Benign essential hypertension - CMP14+EGFR - CBC with Differential/Platelet  3. Benign prostatic hyperplasia with urinary frequency - CMP14+EGFR - CBC with Differential/Platelet  4. Chronic bronchitis, unspecified chronic bronchitis type (HCC) - CMP14+EGFR - CBC with Differential/Platelet  5. History of bladder cancer - CMP14+EGFR - CBC with Differential/Platelet  6. Hyperlipidemia, unspecified hyperlipidemia type - aspirin EC 81 MG tablet; Take 1 tablet (81 mg total) by mouth daily. Swallow whole.  Dispense: 30 tablet; Refill: 12 - CMP14+EGFR - CBC with Differential/Platelet - Lipid panel  7. Non-Hodgkin's lymphoma of tonsil (HCC) - CMP14+EGFR - CBC with Differential/Platelet  8. Vitamin D deficiency - CMP14+EGFR - CBC with Differential/Platelet   Pt would like to hold off on labs at this time. He had full panel drawn at [facility] less than a month ago. They were stable.  Health Maintenance reviewed Diet and exercise encouraged  Follow up plan: 6 months    Jannifer Rodney, FNP

## 2023-02-07 ENCOUNTER — Telehealth: Payer: Self-pay | Admitting: Family

## 2023-02-22 ENCOUNTER — Ambulatory Visit: Payer: Medicare Other | Admitting: Urology

## 2023-02-22 VITALS — BP 144/74 | HR 80

## 2023-02-22 DIAGNOSIS — Z8551 Personal history of malignant neoplasm of bladder: Secondary | ICD-10-CM | POA: Diagnosis not present

## 2023-02-22 LAB — URINALYSIS, ROUTINE W REFLEX MICROSCOPIC
Bilirubin, UA: NEGATIVE
Glucose, UA: NEGATIVE
Ketones, UA: NEGATIVE
Leukocytes,UA: NEGATIVE
Nitrite, UA: NEGATIVE
Protein,UA: NEGATIVE
RBC, UA: NEGATIVE
Specific Gravity, UA: 1.015 (ref 1.005–1.030)
Urobilinogen, Ur: 0.2 mg/dL (ref 0.2–1.0)
pH, UA: 7 (ref 5.0–7.5)

## 2023-02-22 MED ORDER — CIPROFLOXACIN HCL 500 MG PO TABS
500.0000 mg | ORAL_TABLET | Freq: Once | ORAL | Status: AC
Start: 2023-02-22 — End: 2023-02-22
  Administered 2023-02-22: 500 mg via ORAL

## 2023-02-22 MED ORDER — TAMSULOSIN HCL 0.4 MG PO CAPS: 0.4000 mg | ORAL_CAPSULE | Freq: Two times a day (BID) | ORAL | 11 refills | Status: AC

## 2023-02-22 NOTE — Progress Notes (Unsigned)
   02/22/23  CC: followup bladder cancer   HPI:  Blood pressure (!) 144/74, pulse 80. NED. A&Ox3.   No respiratory distress   Abd soft, NT, ND Normal phallus with bilateral descended testicles  Cystoscopy Procedure Note  Patient identification was confirmed, informed consent was obtained, and patient was prepped using Betadine solution.  Lidocaine jelly was administered per urethral meatus.     Pre-Procedure: - Inspection reveals a normal caliber ureteral meatus.  Procedure: The flexible cystoscope was introduced without difficulty - No urethral strictures/lesions are present. - Enlarged prostate  - Normal bladder neck - Bilateral ureteral orifices identified - Bladder mucosa  reveals no ulcers, tumors, or lesions - No bladder stones - No trabeculation   Post-Procedure: - Patient tolerated the procedure well  Assessment/ Plan: Urine for cytology. Followup 1 year for cystoscopy  No follow-ups on file.  Wilkie Aye, MD

## 2023-02-25 LAB — CYTOLOGY, URINE

## 2023-02-26 ENCOUNTER — Encounter: Payer: Self-pay | Admitting: Urology

## 2023-02-26 DIAGNOSIS — R911 Solitary pulmonary nodule: Secondary | ICD-10-CM | POA: Diagnosis not present

## 2023-02-26 DIAGNOSIS — J449 Chronic obstructive pulmonary disease, unspecified: Secondary | ICD-10-CM | POA: Diagnosis not present

## 2023-02-26 DIAGNOSIS — Z8551 Personal history of malignant neoplasm of bladder: Secondary | ICD-10-CM | POA: Diagnosis not present

## 2023-02-26 DIAGNOSIS — Z85038 Personal history of other malignant neoplasm of large intestine: Secondary | ICD-10-CM | POA: Diagnosis not present

## 2023-02-26 DIAGNOSIS — Z8572 Personal history of non-Hodgkin lymphomas: Secondary | ICD-10-CM | POA: Diagnosis not present

## 2023-02-26 NOTE — Patient Instructions (Signed)

## 2023-03-28 DIAGNOSIS — R911 Solitary pulmonary nodule: Secondary | ICD-10-CM | POA: Diagnosis not present

## 2023-04-06 DIAGNOSIS — R911 Solitary pulmonary nodule: Secondary | ICD-10-CM | POA: Diagnosis not present

## 2023-04-23 ENCOUNTER — Encounter: Payer: Self-pay | Admitting: Family

## 2023-04-23 ENCOUNTER — Ambulatory Visit (INDEPENDENT_AMBULATORY_CARE_PROVIDER_SITE_OTHER): Payer: Medicare Other | Admitting: Family

## 2023-04-23 VITALS — BP 139/73 | HR 94 | Temp 97.8°F | Wt 143.0 lb

## 2023-04-23 DIAGNOSIS — G6289 Other specified polyneuropathies: Secondary | ICD-10-CM

## 2023-04-23 DIAGNOSIS — I10 Essential (primary) hypertension: Secondary | ICD-10-CM | POA: Diagnosis not present

## 2023-04-23 DIAGNOSIS — C8599 Non-Hodgkin lymphoma, unspecified, extranodal and solid organ sites: Secondary | ICD-10-CM

## 2023-04-23 MED ORDER — LISINOPRIL 20 MG PO TABS: 20.0000 mg | ORAL_TABLET | Freq: Every day | ORAL | 3 refills | Status: AC

## 2023-04-23 NOTE — Progress Notes (Signed)
 Subjective:    Patient ID: Kenneth Reed, male    DOB: 18-Nov-1945, 78 y.o.   MRN: [id number]  Chief Complaint  Patient presents with   Cold Feet    Burning feeling in feet    PT presents to the office today with bilateral burning/cold feet. He reports he went out in the cold 5 days ago and started having the burning feeling.   He has Non-Hodgkin lymphoma of tonsil. He completed chemo and radiation in 2015. Hypertension This is a chronic problem. The current episode started more than 1 year ago. The problem has been resolved since onset. The problem is controlled. Associated symptoms include malaise/fatigue. Pertinent negatives include no peripheral edema, PND or shortness of breath. The current treatment provides moderate improvement.      Review of Systems  Constitutional:  Positive for malaise/fatigue.  Respiratory:  Negative for shortness of breath.   Cardiovascular:  Negative for PND.  All other systems reviewed and are negative.      Objective:   Physical Exam Vitals reviewed.  Constitutional:      General: He is not in acute distress.    Appearance: He is well-developed.  HENT:     Head: Normocephalic.     Right Ear: External ear normal.     Left Ear: External ear normal.  Eyes:     General:        Right eye: No discharge.        Left eye: No discharge.     Pupils: Pupils are equal, round, and reactive to light.  Neck:     Thyroid : No thyromegaly.  Cardiovascular:     Rate and Rhythm: Normal rate and regular rhythm.     Heart sounds: Normal heart sounds. No murmur heard. Pulmonary:     Effort: Pulmonary effort is normal. No respiratory distress.     Breath sounds: Normal breath sounds. No wheezing.  Abdominal:     General: Bowel sounds are normal. There is no distension.     Palpations: Abdomen is soft.     Tenderness: There is no abdominal tenderness.  Musculoskeletal:        General: No tenderness. Normal range of motion.     Cervical back: Normal  range of motion and neck supple.  Feet:     Right foot:     Protective Sensation: 6 sites tested.  6 sites sensed.     Left foot:     Protective Sensation: 6 sites tested.  6 sites sensed.  Skin:    General: Skin is warm and dry.     Findings: No erythema or rash.  Neurological:     Mental Status: He is alert and oriented to person, place, and time.     Cranial Nerves: No cranial nerve deficit.     Deep Tendon Reflexes: Reflexes are normal and symmetric.  Psychiatric:        Behavior: Behavior normal.        Thought Content: Thought content normal.        Judgment: Judgment normal.     BP 139/73   Pulse 94   Temp 97.8 F (36.6 C) (Temporal)   Wt 143 lb (64.9 kg)   SpO2 99%   BMI 19.94 kg/m        Assessment & Plan:  Kenneth Reed comes in today with chief complaint of Cold Feet (Burning feeling in feet )   Diagnosis and orders addressed:  1. Other polyneuropathy (Primary) Neuropathy  pain, could be from past chemo? Labs pending  Does not wish to have medications today such as gabapentin.  - CMP14+EGFR - Vitamin B12  2. Non-Hodgkin's lymphoma of tonsil [facility]) [facility] follow up  3. Primary hypertension - lisinopril  (ZESTRIL ) 20 MG tablet; Take 1 tablet (20 mg total) by mouth daily.  Dispense: 90 tablet; Refill: 3  Bari Learn, FNP

## 2023-04-23 NOTE — Patient Instructions (Signed)
 Neuropathic Pain Neuropathic pain is pain caused by damage to the nerves that are responsible for certain sensations in your body (sensory nerves). Neuropathic pain can make you more sensitive to pain. Even a minor sensation can feel very painful. This is usually a long-term (chronic) condition that can be difficult to treat. The type of pain differs from person to person. It may: Start suddenly (acute), or it may develop slowly and become chronic. Come and go as damaged nerves heal, or it may stay at the same level for years. Cause emotional distress, loss of sleep, and a lower quality of life. What are the causes? The most common cause of this condition is diabetes. Many other diseases and conditions can also cause neuropathic pain. Causes of neuropathic pain can be classified as: Toxic. This is caused by medicines and chemicals. The most common causes of toxic neuropathic pain is damage from medicines that kill cancer cells (chemotherapy) or alcohol abuse. Metabolic. This can be caused by: Diabetes. Lack of vitamins like B12. Traumatic. Any injury that cuts, crushes, or stretches a nerve can cause damage and pain. Compression-related. If a sensory nerve gets trapped or compressed for a long period of time, the blood supply to the nerve can be cut off. Vascular. Many blood vessel diseases can cause neuropathic pain by decreasing blood supply and oxygen to nerves. Autoimmune. This type of pain results from diseases in which the body's defense system (immune system) mistakenly attacks sensory nerves. Examples of autoimmune diseases that can cause neuropathic pain include lupus and multiple sclerosis. Infectious. Many types of viral infections can damage sensory nerves and cause pain. Shingles infection is a common cause of this type of pain. Inherited. Neuropathic pain can be a symptom of many diseases that are passed down through families (genetic). What increases the risk? You are more likely to  develop this condition if: You have diabetes. You smoke. You drink too much alcohol. You are taking certain medicines, including chemotherapy or medicines that treat immune system disorders. What are the signs or symptoms? The main symptom is pain. Neuropathic pain is often described as: Burning. Shock-like. Stinging. Hot or cold. Itching. How is this diagnosed? No single test can diagnose neuropathic pain. It is diagnosed based on: A physical exam and your symptoms. Your health care provider will ask you about your pain. You may be asked to use a pain scale to describe how bad your pain is. Tests. These may be done to see if you have a cause and location of any nerve damage. They include: Nerve conduction studies and electromyography to test how well nerve signals travel through your nerves and muscles (electrodiagnostic testing). Skin biopsy to evaluate for small fiber neuropathy. Imaging studies, such as: X-rays. CT scan. MRI. How is this treated? Treatment for neuropathic pain may change over time. You may need to try different treatment options or a combination of treatments. Some options include: Treating the underlying cause of the neuropathy, such as diabetes, kidney disease, or vitamin deficiencies. Stopping medicines that can cause neuropathy, such as chemotherapy. Medicine to relieve pain. Medicines may include: Prescription or over-the-counter pain medicine. Anti-seizure medicine. Antidepressant medicines. Pain-relieving patches or creams that are applied to painful areas of skin. A medicine to numb the area (local anesthetic), which can be injected as a nerve block. Transcutaneous nerve stimulation. This uses electrical currents to block painful nerve signals. The treatment is painless. Alternative treatments, such as: Acupuncture. Meditation. Massage. Occupational or physical therapy. Pain management programs. Counseling. Follow  these instructions at  home: Medicines  Take over-the-counter and prescription medicines only as told by your health care provider. Ask your health care provider if the medicine prescribed to you: Requires you to avoid driving or using machinery. Can cause constipation. You may need to take these actions to prevent or treat constipation: Drink enough fluid to keep your urine pale yellow. Take over-the-counter or prescription medicines. Eat foods that are high in fiber, such as beans, whole grains, and fresh fruits and vegetables. Limit foods that are high in fat and processed sugars, such as fried or sweet foods. Lifestyle  Have a good support system at home. Consider joining a chronic pain support group. Do not use any products that contain nicotine or tobacco. These products include cigarettes, chewing tobacco, and vaping devices, such as e-cigarettes. If you need help quitting, ask your health care provider. Do not drink alcohol. General instructions Learn as much as you can about your condition. Work closely with all your health care providers to find the treatment plan that works best for you. Ask your health care provider what activities are safe for you. Keep all follow-up visits. This is important. Contact a health care provider if: Your pain treatments are not working. You are having side effects from your medicines. You are struggling with tiredness (fatigue), mood changes, depression, or anxiety. Get help right away if: You have thoughts of hurting yourself. Get help right away if you feel like you may hurt yourself or others, or have thoughts about taking your own life. Go to your nearest emergency room or: Call 911. Call the National Suicide Prevention Lifeline at 947-472-5826 or 988. This is open 24 hours a day. Text the Crisis Text Line at 657-070-3362. Summary Neuropathic pain is pain caused by damage to the nerves that are responsible for certain sensations in your body (sensory  nerves). Neuropathic pain may come and go as damaged nerves heal, or it may stay at the same level for years. Neuropathic pain is usually a long-term condition that can be difficult to treat. Consider joining a chronic pain support group. This information is not intended to replace advice given to you by your health care provider. Make sure you discuss any questions you have with your health care provider. Document Revised: 11/21/2020 Document Reviewed: 11/21/2020 Elsevier Patient Education  2024 [employer].

## 2023-04-24 LAB — CMP14+EGFR
ALT: 20 [IU]/L (ref 0–44)
AST: 21 [IU]/L (ref 0–40)
Albumin: 4.5 g/dL (ref 3.8–4.8)
Alkaline Phosphatase: 58 [IU]/L (ref 44–121)
BUN/Creatinine Ratio: 13 (ref 10–24)
BUN: 13 mg/dL (ref 8–27)
Bilirubin Total: 0.7 mg/dL (ref 0.0–1.2)
CO2: 22 mmol/L (ref 20–29)
Calcium: 9.4 mg/dL (ref 8.6–10.2)
Chloride: 100 mmol/L (ref 96–106)
Creatinine, Ser: 0.99 mg/dL (ref 0.76–1.27)
Globulin, Total: 2.2 g/dL (ref 1.5–4.5)
Glucose: 79 mg/dL (ref 70–99)
Potassium: 4.1 mmol/L (ref 3.5–5.2)
Sodium: 138 mmol/L (ref 134–144)
Total Protein: 6.7 g/dL (ref 6.0–8.5)
eGFR: 77 mL/min/{1.73_m2} (ref >59)

## 2023-04-24 LAB — VITAMIN B12: Vitamin B-12: 824 pg/mL (ref 232–1245)

## 2023-07-23 ENCOUNTER — Ambulatory Visit: Payer: Medicare Other | Admitting: Family

## 2023-07-23 ENCOUNTER — Encounter: Payer: Self-pay | Admitting: Family

## 2023-07-23 VITALS — BP 137/72 | HR 70 | Temp 97.2°F | Ht 71.0 in | Wt 140.0 lb

## 2023-07-23 DIAGNOSIS — Z8551 Personal history of malignant neoplasm of bladder: Secondary | ICD-10-CM

## 2023-07-23 DIAGNOSIS — I700 Atherosclerosis of aorta: Secondary | ICD-10-CM | POA: Diagnosis not present

## 2023-07-23 DIAGNOSIS — J42 Unspecified chronic bronchitis: Secondary | ICD-10-CM | POA: Diagnosis not present

## 2023-07-23 DIAGNOSIS — R350 Frequency of micturition: Secondary | ICD-10-CM | POA: Diagnosis not present

## 2023-07-23 DIAGNOSIS — N401 Enlarged prostate with lower urinary tract symptoms: Secondary | ICD-10-CM

## 2023-07-23 DIAGNOSIS — R110 Nausea: Secondary | ICD-10-CM

## 2023-07-23 DIAGNOSIS — K219 Gastro-esophageal reflux disease without esophagitis: Secondary | ICD-10-CM | POA: Diagnosis not present

## 2023-07-23 DIAGNOSIS — E785 Hyperlipidemia, unspecified: Secondary | ICD-10-CM

## 2023-07-23 DIAGNOSIS — I10 Essential (primary) hypertension: Secondary | ICD-10-CM

## 2023-07-23 LAB — CBC WITH DIFFERENTIAL/PLATELET

## 2023-07-23 LAB — LIPID PANEL

## 2023-07-23 MED ORDER — ONDANSETRON HCL 4 MG PO TABS: 4.0000 mg | ORAL_TABLET | Freq: Three times a day (TID) | ORAL | 0 refills | Status: AC | PRN

## 2023-07-23 MED ORDER — OMEPRAZOLE 20 MG PO CPDR: 20.0000 mg | DELAYED_RELEASE_CAPSULE | Freq: Every day | ORAL | 1 refills | Status: AC

## 2023-07-23 NOTE — Progress Notes (Signed)
 Subjective:    Patient ID: Kenneth Reed, male    DOB: 1945-05-03, 78 y.o.   MRN: [id number]  Chief Complaint  Patient presents with   Medical Management of Chronic Issues   Nausea    For over a week taking nexium   Pt presents to the office today for chronic follow up.   [facility] He is followed by Urologists annually for BPH and hx bladder cancer.   He was followed by ENT  for Non-hodgkin lymphoma of tonsil, but has been cleared since greater than 5 years. .   Pulmonologist annually for COPD and lung nodules. 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  He reports his breathing is stable and taking Advair BID.    He has aortic atherosclerosis and takes Crestor daily. Stable. Hypertension This is a chronic problem. The current episode started more than 1 year ago. The problem has been resolved since onset. The problem is controlled. Pertinent negatives include no malaise/fatigue, peripheral edema or shortness of breath. Risk factors for coronary artery disease include dyslipidemia, male gender and sedentary lifestyle. Past treatments include ACE inhibitors. The current treatment provides moderate improvement.  Benign Prostatic Hypertrophy This is a chronic problem. The current episode started more than 1 year ago. Irritative symptoms include nocturia (3). Irritative symptoms do not include urgency. Obstructive symptoms include an intermittent stream, straining and a weak stream. Associated symptoms include nausea. Past treatments include tamsulosin. The treatment provided mild relief.  Hyperlipidemia This is a chronic problem. The current episode started more than 1 year ago. The problem is controlled. Recent lipid tests were reviewed and are normal. Pertinent negatives include no shortness of breath. Current antihyperlipidemic treatment includes statins. The current treatment provides moderate improvement of lipids. Risk factors for coronary artery disease  include dyslipidemia, hypertension, male sex and a sedentary lifestyle.  Gastroesophageal Reflux He complains of belching, heartburn and nausea. This is a recurrent problem. The current episode started 1 to 4 weeks ago.      Review of Systems  Constitutional:  Negative for malaise/fatigue.  Respiratory:  Negative for shortness of breath.   Gastrointestinal:  Positive for heartburn and nausea.  Genitourinary:  Positive for nocturia (3). Negative for urgency.  All other systems reviewed and are negative.  Family History  Problem Relation Age of Onset   Cancer Father        leukemia   Cancer Maternal Aunt        leukemia   Cancer Paternal Grandmother        leukemia   Narcolepsy Daughter    Social History   Socioeconomic History   Marital status: Single    Spouse name: Not on file   Number of children: 2   Years of education: GED   Highest education level: GED or equivalent  Occupational History   Occupation: Retired    Associate Professor: [employer]    Comment: textile   Tobacco Use   Smoking status: Never   Smokeless tobacco: Former    Types: Chew    Quit date: 2010  Vaping Use   Vaping status: Never Used  Substance and Sexual Activity   Alcohol use: Yes    Alcohol/week: 2.0 standard drinks of alcohol    Types: 2 Cans of beer per week    Comment: few beers here and there, nothing regular   Drug use: No   Sexual activity: Not Currently  Other Topics Concern   Not on  file  Social History Narrative   Lives with his daughter   Not very sociable - likes to stay home   Social Drivers of Health   Financial Resource Strain: Low Risk  (03/16/2022)   Overall Financial Resource Strain (CARDIA)    Difficulty of Paying Living Expenses: Not hard at all  Food Insecurity: No Food Insecurity (03/16/2022)   Hunger Vital Sign    Worried About Running Out of Food in the Last Year: Never true    Ran Out of Food in the Last Year: Never true  Transportation Needs: No Transportation Needs  (03/16/2022)   PRAPARE - Administrator, Civil Service (Medical): No    Lack of Transportation (Non-Medical): No  Physical Activity: Sufficiently Active (03/16/2022)   Exercise Vital Sign    Days of Exercise per Week: 5 days    Minutes of Exercise per Session: 30 min  Stress: No Stress Concern Present (03/16/2022)   [employer] of Occupational Health - Occupational Stress Questionnaire    Feeling of Stress : Not at all  Social Connections: Moderately Isolated (03/16/2022)   Social Connection and Isolation Panel [NHANES]    Frequency of Communication with Friends and Family: More than three times a week    Frequency of Social Gatherings with Friends and Family: More than three times a week    Attends Religious Services: More than 4 times per year    Active Member of [employer] or Organizations: No    Attends Banker Meetings: Never    Marital Status: Divorced       Objective:   Physical Exam Vitals reviewed.  Constitutional:      General: He is not in acute distress.    Appearance: He is well-developed.  HENT:     Head: Normocephalic.     Right Ear: Tympanic membrane normal.     Left Ear: Tympanic membrane normal.     Ears:     Comments: HOH  Eyes:     General:        Right eye: No discharge.        Left eye: No discharge.     Pupils: Pupils are equal, round, and reactive to light.  Neck:     Thyroid: No thyromegaly.  Cardiovascular:     Rate and Rhythm: Normal rate and regular rhythm.     Heart sounds: Normal heart sounds. No murmur heard. Pulmonary:     Effort: Pulmonary effort is normal. No respiratory distress.     Breath sounds: Normal breath sounds. No wheezing.  Abdominal:     General: Bowel sounds are normal. There is no distension.     Palpations: Abdomen is soft.     Tenderness: There is no abdominal tenderness.  Musculoskeletal:        General: No tenderness. Normal range of motion.     Cervical back: Normal range of motion and neck  supple.  Skin:    General: Skin is warm and dry.     Findings: No erythema or rash.  Neurological:     Mental Status: He is alert and oriented to person, place, and time.     Cranial Nerves: No cranial nerve deficit.     Deep Tendon Reflexes: Reflexes are normal and symmetric.  Psychiatric:        Behavior: Behavior normal.        Thought Content: Thought content normal.        Judgment: Judgment normal.  BP 137/72   Pulse 70   Temp (!) 97.2 F (36.2 C) (Temporal)   Ht 5\' 11"  (1.803 m)   Wt 140 lb (63.5 kg)   SpO2 95%   BMI 19.53 kg/m       Assessment & Plan:   Kenneth Reed comes in today with chief complaint of Medical Management of Chronic Issues and Nausea (For over a week taking nexium)   Diagnosis and orders addressed:  1. Benign prostatic hyperplasia with urinary frequency (Primary)  - CMP14+EGFR - CBC with Differential/Platelet  2. Chronic bronchitis, unspecified chronic bronchitis type (HCC) - CMP14+EGFR - CBC with Differential/Platelet  3. Aortic atherosclerosis (HCC)  - CMP14+EGFR - CBC with Differential/Platelet - Lipid panel  4. Hyperlipidemia, unspecified hyperlipidemia type - CMP14+EGFR - CBC with Differential/Platelet - Lipid panel - TSH  5. Primary hypertension - CMP14+EGFR - CBC with Differential/Platelet  6. History of bladder cancer  - CMP14+EGFR - CBC with Differential/Platelet  7. Gastroesophageal reflux disease without esophagitis - omeprazole (PRILOSEC) 20 MG capsule; Take 1 capsule (20 mg total) by mouth daily.  Dispense: 90 capsule; Refill: 1 - ondansetron (ZOFRAN) 4 MG tablet; Take 1 tablet (4 mg total) by mouth every 8 (eight) hours as needed for nausea or vomiting.  Dispense: 20 tablet; Refill: 0 - CMP14+EGFR - CBC with Differential/Platelet  8. Nausea - ondansetron (ZOFRAN) 4 MG tablet; Take 1 tablet (4 mg total) by mouth every 8 (eight) hours as needed for nausea or vomiting.  Dispense: 20 tablet; Refill: 0 -  CMP14+EGFR - CBC with Differential/Platelet   Start Prilosec 20 mg  -Diet discussed- Avoid fried, spicy, citrus foods, caffeine and alcohol -Do not eat 2-3 hours before bedtime -Encouraged small frequent meals -Avoid NSAID's -Zofran as needed  Health Maintenance reviewed Diet and exercise encouraged  Follow up plan: 6 months    Tommas Fragmin, FNP

## 2023-07-23 NOTE — Patient Instructions (Signed)
 GERD in Adults: What to Know  Gastroesophageal reflux (GER) is when acid from your stomach flows up into your esophagus. Your esophagus is the part of your body that moves food from your mouth to your stomach. Normally, food goes down and stays in your stomach to be digested. But with GER, food and stomach acid may go back up. You may have a disease called gastroesophageal reflux disease (GERD) if the reflux: Happens often. Causes very bad symptoms. Makes your esophagus sore and swollen. Over time, GERD can make small holes called ulcers in the lining of your esophagus. What are the causes? GERD is caused by a problem with the muscle between your esophagus and stomach. This muscle is called the lower esophageal sphincter (LES). When it's weak or not normal, it doesn't close like it should. This means food and stomach acid can go back up into your esophagus. The muscle can be weak if: You smoke or use products with tobacco in them. You're pregnant. You have a type of hernia called a hiatal hernia. You eat certain foods and drinks. These include: Alcohol. Coffee. Chocolate. Onions. Peppermint. What increases the risk? Being overweight. Having a disease that affects your connective tissue. Taking NSAIDs, such as ibuprofen. What are the signs or symptoms? Heartburn. Trouble swallowing. Pain when you swallow. The feeling of having a lump in your throat. A bitter taste in your mouth. Bad breath. Having an upset or bloated stomach. Burping. Chest pain. Other conditions can also cause chest pain. Make sure you see your health care provider if you have chest pain. Wheezing. This is when you make high-pitched whistling sounds when you breathe, most often when you breathe out. A long-term cough or a cough at night. How is this diagnosed? GERD may be diagnosed based on your medical history and a physical exam. You may also have tests. These may include: An endoscopy. This test looks at your  stomach and esophagus with a small camera. A barium swallow test. This shows the shape and size of your esophagus and how well it's working. Tests of your esophagus to check for: Acid levels. Pressure. How is this treated? Treatment may depend on how bad your symptoms are. It may include: Changes to your diet and daily life. Medicines. Surgery. Follow these instructions at home: Eating and drinking Follow an eating plan as told by your provider. You may need to avoid certain foods and drinks. These may include: Coffee and tea, with or without caffeine. Alcohol. Energy drinks and sports drinks. Fizzy drinks or sodas. Chocolate and cocoa. Peppermint and mint flavorings. Garlic and onions. Horseradish. Spicy and acidic foods. These include: Peppers. Chili powder and curry powder. Vinegar. Hot sauces and BBQ sauce. Citrus fruits and juices. These include: Oranges. Lemons. Limes. Tomato-based foods. These include: Red sauce and pizza with red sauce. Chili. Salsa. Fried and fatty foods. These include: Donuts. [place] fries. Potato chips. High-fat dressings. High-fat meats. These include: Hot dogs and sausage. Rib eye steak. Ham and bacon. High-fat dairy items. These include: Whole milk. Butter. Cream cheese. Eat small meals often. Avoid eating big meals. Avoid drinking lots of liquid with your meals. Try not to eat meals during the 2-3 hours before bedtime. Try not to lie down right after you eat. Do not exercise right after you eat. Lifestyle  If you're overweight, lose an amount of weight that's healthy for you. Ask your provider about a safe weight loss goal. Do not smoke, vape, or use nicotine or tobacco. Wear  loose clothes. Do not wear things that are tight around your waist. When you sleep, try: Raising the head of your bed about 6 inches (15 cm). You can use a wedge to do this. Lying down on your left side. Try to lower your stress. If you need help doing  this, ask your provider. General instructions Take your medicines only as told. Do not take aspirin or ibuprofen unless you're told to. Watch for any changes in your symptoms. Do not bend over if it makes your symptoms worse. Contact a health care provider if: You have new symptoms. You have trouble: Drinking. Swallowing. Eating. It hurts to swallow. You have wheezing. You have a cough that won't go away. Your voice is hoarse. Your symptoms don't get better with treatment. Get help right away if: You have pain all of a sudden in your: Arm. Neck. Jaw. Teeth. Back. You feel sweaty, dizzy, or light-headed all of a sudden. You faint. You have chest pain or shortness of breath. You vomit and the vomit is: Green, yellow, or black. Looks like blood or coffee grounds. Your poop is red, bloody, or black. These symptoms may be an emergency. Call 911 right away. Do not wait to see if the symptoms will go away. Do not drive yourself to the hospital. This information is not intended to replace advice given to you by your health care provider. Make sure you discuss any questions you have with your health care provider. Document Revised: 02/05/2023 Document Reviewed: 08/22/2022 Elsevier Patient Education  2024 [employer].

## 2023-07-24 LAB — TSH: TSH: 2.38 u[IU]/mL (ref 0.450–4.500)

## 2023-07-24 LAB — CBC WITH DIFFERENTIAL/PLATELET
Basos: 1 %
EOS (ABSOLUTE): 0 10*3/uL (ref 0.0–0.2)
Eos: 1 %
Hematocrit: 44.9 % (ref 37.5–51.0)
Hemoglobin: 15.2 g/dL (ref 13.0–17.7)
Immature Granulocytes: 0 %
Immature Granulocytes: 0 10*3/uL (ref 0.0–0.1)
Lymphs: 21 %
MCH: 31.7 pg (ref 26.6–33.0)
MCHC: 33.9 g/dL (ref 31.5–35.7)
MCV: 94 fL (ref 79–97)
Monocytes Absolute: 0.1 10*3/uL (ref 0.0–0.4)
Monocytes Absolute: 0.3 10*3/uL (ref 0.1–0.9)
Monocytes: 7 %
Neutrophils Absolute: 0.9 10*3/uL (ref 0.7–3.1)
Neutrophils Absolute: 3 10*3/uL (ref 1.4–7.0)
Neutrophils: 70 %
Platelets: 240 10*3/uL (ref 150–450)
RBC: 4.79 x10E6/uL (ref 4.14–5.80)
RDW: 12.3 % (ref 11.6–15.4)
WBC: 4.3 10*3/uL (ref 3.4–10.8)

## 2023-07-24 LAB — CMP14+EGFR
ALT: 14 IU/L (ref 0–44)
AST: 18 IU/L (ref 0–40)
Albumin: 4.5 g/dL (ref 3.8–4.8)
Alkaline Phosphatase: 66 IU/L (ref 44–121)
BUN/Creatinine Ratio: 11 (ref 10–24)
BUN: 10 mg/dL (ref 8–27)
Bilirubin Total: 0.8 mg/dL (ref 0.0–1.2)
CO2: 21 mmol/L (ref 20–29)
Calcium: 9.3 mg/dL (ref 8.6–10.2)
Chloride: 102 mmol/L (ref 96–106)
Creatinine, Ser: 0.93 mg/dL (ref 0.76–1.27)
Globulin, Total: 2 g/dL (ref 1.5–4.5)
Glucose: 95 mg/dL (ref 70–99)
Potassium: 4.8 mmol/L (ref 3.5–5.2)
Sodium: 138 mmol/L (ref 134–144)
Total Protein: 6.5 g/dL (ref 6.0–8.5)
eGFR: 83 mL/min/{1.73_m2} (ref >59)

## 2023-07-24 LAB — LIPID PANEL
Cholesterol, Total: 142 mg/dL (ref 100–199)
HDL: 64 mg/dL (ref >39)
LDL CALC COMMENT:: 2.2 ratio (ref 0.0–5.0)
LDL Chol Calc (NIH): 66 mg/dL (ref 0–99)
Triglycerides: 57 mg/dL (ref 0–149)
VLDL Cholesterol Cal: 12 mg/dL (ref 5–40)

## 2023-08-27 ENCOUNTER — Encounter: Admitting: Family

## 2023-10-23 ENCOUNTER — Encounter

## 2023-10-23 NOTE — Progress Notes (Signed)
 This encounter was created in error - please disregard.

## 2024-01-23 ENCOUNTER — Ambulatory Visit: Admitting: Family

## 2024-02-26 DIAGNOSIS — J449 Chronic obstructive pulmonary disease, unspecified: Secondary | ICD-10-CM | POA: Diagnosis not present

## 2024-02-26 DIAGNOSIS — R911 Solitary pulmonary nodule: Secondary | ICD-10-CM | POA: Diagnosis not present

## 2024-02-26 DIAGNOSIS — Z8572 Personal history of non-Hodgkin lymphomas: Secondary | ICD-10-CM | POA: Diagnosis not present

## 2024-02-26 DIAGNOSIS — Z85038 Personal history of other malignant neoplasm of large intestine: Secondary | ICD-10-CM | POA: Diagnosis not present

## 2024-02-26 DIAGNOSIS — Z8551 Personal history of malignant neoplasm of bladder: Secondary | ICD-10-CM | POA: Diagnosis not present

## 2024-02-28 ENCOUNTER — Ambulatory Visit: Payer: Medicare Other | Admitting: Urology

## 2024-02-28 ENCOUNTER — Encounter: Payer: Self-pay | Admitting: Urology

## 2024-02-28 VITALS — BP 126/64 | HR 90

## 2024-02-28 DIAGNOSIS — Z8551 Personal history of malignant neoplasm of bladder: Secondary | ICD-10-CM

## 2024-02-28 DIAGNOSIS — Z08 Encounter for follow-up examination after completed treatment for malignant neoplasm: Secondary | ICD-10-CM

## 2024-02-28 LAB — URINALYSIS, ROUTINE W REFLEX MICROSCOPIC
Bilirubin, UA: NEGATIVE
Glucose, UA: NEGATIVE
Ketones, UA: NEGATIVE
Nitrite, UA: NEGATIVE
Protein,UA: NEGATIVE
RBC, UA: NEGATIVE
Specific Gravity, UA: 1.015 (ref 1.005–1.030)
Urobilinogen, Ur: 0.2 mg/dL (ref 0.2–1.0)
pH, UA: 7.5 (ref 5.0–7.5)

## 2024-02-28 LAB — MICROSCOPIC EXAMINATION
Bacteria, UA: NONE SEEN
RBC, Urine: NONE SEEN /HPF (ref 0–2)

## 2024-02-28 MED ORDER — CIPROFLOXACIN HCL 500 MG PO TABS
500.0000 mg | ORAL_TABLET | Freq: Once | ORAL | Status: AC
  Administered 2024-02-28: 500 mg via ORAL

## 2024-02-28 NOTE — Patient Instructions (Signed)

## 2024-02-28 NOTE — Progress Notes (Signed)
   02/28/24  CC: followup bladder cancer  HPI: [name] is a 78yo here for followup for bladder cancer Blood pressure 126/64, pulse 90. NED. A&Ox3.   No respiratory distress   Abd soft, NT, ND Normal phallus with bilateral descended testicles  Cystoscopy Procedure Note  Patient identification was confirmed, informed consent was obtained, and patient was prepped using Betadine  solution.  Lidocaine  jelly was administered per urethral meatus.     Pre-Procedure: - Inspection reveals a normal caliber ureteral meatus.  Procedure: The flexible cystoscope was introduced without difficulty - No urethral strictures/lesions are present. - Enlarged prostate  - Normal bladder neck - Bilateral ureteral orifices identified - Bladder mucosa  reveals no ulcers, tumors, or lesions - No bladder stones - No trabeculation     Post-Procedure: - Patient tolerated the procedure well  Assessment/ Plan: Followup 1 year for cystoscopy  No follow-ups on file.  Kenneth Clara, MD

## 2024-10-23 ENCOUNTER — Ambulatory Visit: Payer: Self-pay

## 2025-02-17 ENCOUNTER — Other Ambulatory Visit: Admitting: Urology
